# Patient Record
Sex: Male | Born: 1940 | Race: White | Hispanic: No | Smoking: Former smoker
Health system: Southern US, Community
[De-identification: ages and names within clinical notes are randomized; demographics above are authoritative.]

## PROBLEM LIST (undated history)

## (undated) DIAGNOSIS — F32A Depression, unspecified: Secondary | ICD-10-CM

## (undated) DIAGNOSIS — J449 Chronic obstructive pulmonary disease, unspecified: Secondary | ICD-10-CM

## (undated) DIAGNOSIS — J189 Pneumonia, unspecified organism: Secondary | ICD-10-CM

## (undated) DIAGNOSIS — N189 Chronic kidney disease, unspecified: Secondary | ICD-10-CM

## (undated) DIAGNOSIS — Z972 Presence of dental prosthetic device (complete) (partial): Secondary | ICD-10-CM

## (undated) DIAGNOSIS — E669 Obesity, unspecified: Secondary | ICD-10-CM

## (undated) DIAGNOSIS — R58 Hemorrhage, not elsewhere classified: Secondary | ICD-10-CM

## (undated) DIAGNOSIS — F329 Major depressive disorder, single episode, unspecified: Secondary | ICD-10-CM

## (undated) DIAGNOSIS — Z9289 Personal history of other medical treatment: Secondary | ICD-10-CM

## (undated) DIAGNOSIS — G4730 Sleep apnea, unspecified: Secondary | ICD-10-CM

## (undated) DIAGNOSIS — I5189 Other ill-defined heart diseases: Secondary | ICD-10-CM

## (undated) DIAGNOSIS — R0689 Other abnormalities of breathing: Secondary | ICD-10-CM

## (undated) DIAGNOSIS — N183 Chronic kidney disease, stage 3 unspecified: Secondary | ICD-10-CM

## (undated) DIAGNOSIS — R0600 Dyspnea, unspecified: Secondary | ICD-10-CM

## (undated) DIAGNOSIS — I272 Pulmonary hypertension, unspecified: Secondary | ICD-10-CM

## (undated) DIAGNOSIS — I2510 Atherosclerotic heart disease of native coronary artery without angina pectoris: Secondary | ICD-10-CM

## (undated) DIAGNOSIS — I10 Essential (primary) hypertension: Secondary | ICD-10-CM

## (undated) DIAGNOSIS — M1990 Unspecified osteoarthritis, unspecified site: Secondary | ICD-10-CM

## (undated) DIAGNOSIS — Z8631 Personal history of diabetic foot ulcer: Secondary | ICD-10-CM

## (undated) DIAGNOSIS — I2699 Other pulmonary embolism without acute cor pulmonale: Secondary | ICD-10-CM

## (undated) HISTORY — PX: MULTIPLE TOOTH EXTRACTIONS: SHX2053

## (undated) HISTORY — DX: Dyspnea, unspecified: R06.00

## (undated) HISTORY — DX: Major depressive disorder, single episode, unspecified: F32.9

## (undated) HISTORY — DX: Depression, unspecified: F32.A

## (undated) HISTORY — DX: Atherosclerotic heart disease of native coronary artery without angina pectoris: I25.10

## (undated) HISTORY — DX: Sleep apnea, unspecified: G47.30

## (undated) HISTORY — DX: Chronic kidney disease, stage 3 unspecified: N18.30

## (undated) HISTORY — DX: Other ill-defined heart diseases: I51.89

## (undated) HISTORY — DX: Other pulmonary embolism without acute cor pulmonale: I26.99

## (undated) HISTORY — DX: Essential (primary) hypertension: I10

## (undated) HISTORY — DX: Hemorrhage, not elsewhere classified: R58

## (undated) HISTORY — DX: Other abnormalities of breathing: R06.89

## (undated) HISTORY — DX: Chronic obstructive pulmonary disease, unspecified: J44.9

## (undated) HISTORY — DX: Chronic kidney disease, unspecified: N18.9

## (undated) HISTORY — DX: Chronic kidney disease, stage 3 (moderate): N18.3

## (undated) HISTORY — DX: Obesity, unspecified: E66.9

## (undated) HISTORY — DX: Pulmonary hypertension, unspecified: I27.20

---

## 1970-01-31 HISTORY — PX: EXPLORATORY LAPAROTOMY: SUR591

## 1998-01-31 HISTORY — PX: BACK SURGERY: SHX140

## 2005-02-03 ENCOUNTER — Encounter: Admission: RE | Admit: 2005-02-03 | Discharge: 2005-05-04 | Payer: Self-pay | Admitting: *Deleted

## 2007-04-06 ENCOUNTER — Ambulatory Visit: Payer: Self-pay | Admitting: Vascular Surgery

## 2007-05-30 ENCOUNTER — Ambulatory Visit: Payer: Self-pay | Admitting: Pain Medicine

## 2007-06-11 ENCOUNTER — Ambulatory Visit: Payer: Self-pay | Admitting: Pain Medicine

## 2007-06-12 ENCOUNTER — Ambulatory Visit: Payer: Self-pay | Admitting: Pain Medicine

## 2007-07-02 ENCOUNTER — Ambulatory Visit: Payer: Self-pay | Admitting: Pain Medicine

## 2007-08-01 ENCOUNTER — Ambulatory Visit: Payer: Self-pay | Admitting: Pain Medicine

## 2007-08-02 ENCOUNTER — Encounter: Admission: RE | Admit: 2007-08-02 | Discharge: 2007-08-02 | Payer: Self-pay | Admitting: Internal Medicine

## 2007-08-08 ENCOUNTER — Ambulatory Visit: Payer: Self-pay | Admitting: Pain Medicine

## 2007-08-23 ENCOUNTER — Ambulatory Visit: Payer: Self-pay | Admitting: Pain Medicine

## 2007-09-06 ENCOUNTER — Ambulatory Visit: Payer: Self-pay | Admitting: Physician Assistant

## 2007-09-18 ENCOUNTER — Ambulatory Visit: Payer: Self-pay | Admitting: Pain Medicine

## 2007-10-03 ENCOUNTER — Ambulatory Visit: Payer: Self-pay | Admitting: Physician Assistant

## 2007-10-09 ENCOUNTER — Ambulatory Visit: Payer: Self-pay | Admitting: Pain Medicine

## 2007-10-25 ENCOUNTER — Ambulatory Visit: Payer: Self-pay | Admitting: Physician Assistant

## 2007-11-01 ENCOUNTER — Ambulatory Visit: Payer: Self-pay | Admitting: Pain Medicine

## 2007-11-21 ENCOUNTER — Ambulatory Visit: Payer: Self-pay | Admitting: Physician Assistant

## 2007-12-19 ENCOUNTER — Ambulatory Visit: Payer: Self-pay | Admitting: Pain Medicine

## 2008-02-25 ENCOUNTER — Inpatient Hospital Stay: Payer: Self-pay | Admitting: Pain Medicine

## 2008-02-25 ENCOUNTER — Ambulatory Visit: Payer: Self-pay | Admitting: Pain Medicine

## 2008-03-17 ENCOUNTER — Ambulatory Visit: Payer: Self-pay | Admitting: Pain Medicine

## 2008-03-31 DIAGNOSIS — I2699 Other pulmonary embolism without acute cor pulmonale: Secondary | ICD-10-CM

## 2008-03-31 HISTORY — DX: Other pulmonary embolism without acute cor pulmonale: I26.99

## 2008-10-31 DIAGNOSIS — I2510 Atherosclerotic heart disease of native coronary artery without angina pectoris: Secondary | ICD-10-CM

## 2008-10-31 HISTORY — DX: Atherosclerotic heart disease of native coronary artery without angina pectoris: I25.10

## 2008-11-14 HISTORY — PX: CARDIOVASCULAR STRESS TEST: SHX262

## 2008-11-24 ENCOUNTER — Inpatient Hospital Stay (HOSPITAL_COMMUNITY): Admission: AD | Admit: 2008-11-24 | Discharge: 2008-12-04 | Payer: Self-pay | Admitting: Cardiology

## 2008-11-25 ENCOUNTER — Encounter: Payer: Self-pay | Admitting: Cardiology

## 2008-11-25 HISTORY — PX: US ECHOCARDIOGRAPHY: HXRAD669

## 2008-11-26 ENCOUNTER — Ambulatory Visit: Payer: Self-pay | Admitting: Cardiothoracic Surgery

## 2008-11-26 ENCOUNTER — Encounter: Payer: Self-pay | Admitting: Cardiothoracic Surgery

## 2008-11-26 ENCOUNTER — Ambulatory Visit: Payer: Self-pay | Admitting: Internal Medicine

## 2008-12-16 DIAGNOSIS — M1990 Unspecified osteoarthritis, unspecified site: Secondary | ICD-10-CM | POA: Insufficient documentation

## 2008-12-16 DIAGNOSIS — I10 Essential (primary) hypertension: Secondary | ICD-10-CM | POA: Insufficient documentation

## 2008-12-16 DIAGNOSIS — E109 Type 1 diabetes mellitus without complications: Secondary | ICD-10-CM | POA: Insufficient documentation

## 2008-12-16 DIAGNOSIS — F329 Major depressive disorder, single episode, unspecified: Secondary | ICD-10-CM | POA: Insufficient documentation

## 2008-12-17 ENCOUNTER — Ambulatory Visit: Payer: Self-pay | Admitting: Internal Medicine

## 2008-12-17 DIAGNOSIS — R0602 Shortness of breath: Secondary | ICD-10-CM | POA: Insufficient documentation

## 2008-12-17 DIAGNOSIS — I2789 Other specified pulmonary heart diseases: Secondary | ICD-10-CM | POA: Insufficient documentation

## 2008-12-17 DIAGNOSIS — I2510 Atherosclerotic heart disease of native coronary artery without angina pectoris: Secondary | ICD-10-CM | POA: Insufficient documentation

## 2008-12-22 ENCOUNTER — Encounter: Payer: Self-pay | Admitting: Internal Medicine

## 2009-01-01 ENCOUNTER — Ambulatory Visit: Payer: Self-pay | Admitting: Internal Medicine

## 2009-01-05 ENCOUNTER — Telehealth: Payer: Self-pay | Admitting: Internal Medicine

## 2009-01-08 ENCOUNTER — Ambulatory Visit: Payer: Self-pay | Admitting: Pulmonary Disease

## 2009-01-08 DIAGNOSIS — F518 Other sleep disorders not due to a substance or known physiological condition: Secondary | ICD-10-CM | POA: Insufficient documentation

## 2009-01-08 DIAGNOSIS — E669 Obesity, unspecified: Secondary | ICD-10-CM | POA: Insufficient documentation

## 2009-01-08 DIAGNOSIS — G4733 Obstructive sleep apnea (adult) (pediatric): Secondary | ICD-10-CM | POA: Insufficient documentation

## 2009-01-08 DIAGNOSIS — M545 Low back pain, unspecified: Secondary | ICD-10-CM | POA: Insufficient documentation

## 2009-02-20 ENCOUNTER — Encounter: Payer: Self-pay | Admitting: Internal Medicine

## 2009-02-24 ENCOUNTER — Ambulatory Visit: Payer: Self-pay | Admitting: Pulmonary Disease

## 2009-03-02 ENCOUNTER — Inpatient Hospital Stay (HOSPITAL_COMMUNITY): Admission: AD | Admit: 2009-03-02 | Discharge: 2009-03-08 | Payer: Self-pay | Admitting: Cardiology

## 2009-03-03 ENCOUNTER — Ambulatory Visit: Payer: Self-pay | Admitting: Vascular Surgery

## 2009-03-11 ENCOUNTER — Ambulatory Visit: Payer: Self-pay | Admitting: Internal Medicine

## 2009-03-11 DIAGNOSIS — I2699 Other pulmonary embolism without acute cor pulmonale: Secondary | ICD-10-CM | POA: Insufficient documentation

## 2009-03-11 DIAGNOSIS — R3919 Other difficulties with micturition: Secondary | ICD-10-CM | POA: Insufficient documentation

## 2009-03-11 DIAGNOSIS — K661 Hemoperitoneum: Secondary | ICD-10-CM | POA: Insufficient documentation

## 2009-03-11 LAB — CONVERTED CEMR LAB
BUN: 19 mg/dL (ref 6–23)
Basophils Absolute: 0 10*3/uL (ref 0.0–0.1)
Basophils Relative: 0.2 % (ref 0.0–3.0)
CO2: 30 meq/L (ref 19–32)
Calcium: 9 mg/dL (ref 8.4–10.5)
Chloride: 97 meq/L (ref 96–112)
Creatinine, Ser: 1.6 mg/dL — ABNORMAL HIGH (ref 0.4–1.5)
Eosinophils Absolute: 0.3 10*3/uL (ref 0.0–0.7)
Eosinophils Relative: 3.8 % (ref 0.0–5.0)
GFR calc non Af Amer: 46.08 mL/min (ref >60)
Glucose, Bld: 260 mg/dL — ABNORMAL HIGH (ref 70–99)
HCT: 31.7 % — ABNORMAL LOW (ref 39.0–52.0)
Hemoglobin: 10.3 g/dL — ABNORMAL LOW (ref 13.0–17.0)
Lymphocytes Relative: 10.4 % — ABNORMAL LOW (ref 12.0–46.0)
Lymphs Abs: 0.8 10*3/uL (ref 0.7–4.0)
MCHC: 32.5 g/dL (ref 30.0–36.0)
MCV: 97.4 fL (ref 78.0–100.0)
Monocytes Absolute: 0.8 10*3/uL (ref 0.1–1.0)
Monocytes Relative: 9.9 % (ref 3.0–12.0)
Neutro Abs: 6.2 10*3/uL (ref 1.4–7.7)
Neutrophils Relative %: 75.7 % (ref 43.0–77.0)
Platelets: 275 10*3/uL (ref 150.0–400.0)
Potassium: 3.8 meq/L (ref 3.5–5.1)
Pro B Natriuretic peptide (BNP): 139 pg/mL — ABNORMAL HIGH (ref 0.0–100.0)
RBC: 3.25 M/uL — ABNORMAL LOW (ref 4.22–5.81)
RDW: 12.7 % (ref 11.5–14.6)
Sodium: 137 meq/L (ref 135–145)
WBC: 8.1 10*3/uL (ref 4.5–10.5)

## 2009-03-12 ENCOUNTER — Encounter: Admission: RE | Admit: 2009-03-12 | Discharge: 2009-03-12 | Payer: Self-pay | Admitting: Cardiology

## 2009-03-12 ENCOUNTER — Telehealth (INDEPENDENT_AMBULATORY_CARE_PROVIDER_SITE_OTHER): Payer: Self-pay | Admitting: *Deleted

## 2009-03-19 ENCOUNTER — Encounter: Payer: Self-pay | Admitting: Pulmonary Disease

## 2009-03-24 ENCOUNTER — Telehealth: Payer: Self-pay | Admitting: Internal Medicine

## 2009-09-18 ENCOUNTER — Ambulatory Visit: Payer: Self-pay | Admitting: Cardiology

## 2009-12-31 ENCOUNTER — Ambulatory Visit: Payer: Self-pay | Admitting: Cardiology

## 2010-03-02 NOTE — Assessment & Plan Note (Signed)
Summary: 2 week post hosp/apc   Visit Type:  Hospital Follow-up Copy to:  Dr Peter Swaziland Primary Provider/Referring Provider:  Dr. Earlene Plater, [facility] Primary care  CC:  HFU. 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 CABG defrred due to pulm risk d) Possilbe COPD (10 pack year smoker only)- on empriic symbicort e) Obesity f) Out of proportion pulm htn on cath - due to above + ? idiopathic (CTEPH ruled out VQ scan Oct 2010) g) Class 3 dyspnea without desaturation  prior to RCA stent - due to above  Paul Chen is seeing me for the above issues after recent hospitalization for the same. CABG was defered to pulmonary risk. It was thought best to stent RCA, opitimize  OSA and then reassess respiratory status and pulmonary hypertension before deciding on CABG. Today, Paul Chen tells me that dyspnea is significantly better. In hospital, before stent Paul Chen could barely walk 5 feet witihout dyspnea. Today, Paul Chen was able to walk 360 feet in office and stopped due to hip pain and some dyspnea. Still Paul Chen feels dyspneic doing ADLs at home but reckons Paul Chen can climb 1 flight of stairs. Definitely feels stent has helped him. Edema of legs is improved but still present. No associated chest pain, syncope, hemoptysis, orthopnea, pnd. No new complaints. In terms of sleep apnea, Paul Chen is using his CPAP but only 50% of the time.   Current Medications (verified): 1)  Methadose 10 Mg Tabs (Methadone Hcl) .... 2 Tablets Twice A Day 2)  Coumadin 2.5 Mg Tabs (Warfarin Sodium) .... Daily 3)  Glipizide 2.5 Mg Xr24h-Tab (Glipizide) .... Take 1 Tablet By Mouth Two Times A Day 4)  Methocarbamol 500 Mg Tabs (Methocarbamol) .... Take 1 Tablet By Mouth Two Times A Day 5)  Cymbalta 60 Mg Cpep (Duloxetine Hcl) ....  Take 1 Tablet By Mouth Once A Day 6)  Furosemide 40 Mg Tabs (Furosemide) .... Take 1 Tablet By Mouth Two Times A Day 7)  Buspirone Hcl 15 Mg Tabs (Buspirone Hcl) .... Take 1 Tablet By Mouth Two Times A Day 8)  K-Lor 20 Meq Pack (Potassium Chloride) .... Take 1 Tablet By Mouth Two Times A Day 9)  Metoprolol Succinate 100 Mg Xr24h-Tab (Metoprolol Succinate) .... Take 1 Tablet By Mouth Once A Day 10)  Doxazosin Mesylate 4 Mg Tabs (Doxazosin Mesylate) .... Take 1 Tablet By Mouth Once A Day 11)  Allopurinol 100 Mg Tabs (Allopurinol) .... Take 1 Tablet By Mouth Once A Day 12)  Humalog Mix 75/25 75-25 % Susp (Insulin Lispro Prot & Lispro) .... Mix 15 Units Twice A Day and Then 18 Units in The Evening 13)  Aspirin 325 Mg Tabs (Aspirin) .... Take 1 Tablet By Mouth Once A Day 14)  Multivitamins  Tabs (Multiple Vitamin) .... Take 1 Tablet By Mouth Once A Day 15)  Vitamin D 400 Unit Tabs (Cholecalciferol) .... Take 1 Tablet By Mouth Once A Day 16)  Fish Oil 1000 Mg Caps (Omega-3 Fatty Acids) .... Take 1 Tablet By Mouth Two Times A Day 17)  Testosterone Cypionate 200 Mg/ml Oil (Testosterone Cypionate) .... Monthly 18)  Amlodipine Besylate 10 Mg Tabs (Amlodipine Besylate) .... Take 1 Tablet By Mouth Once A Day  19)  Symbicort 80-4.5 Mcg/act Aero (Budesonide-Formoterol Fumarate) .... 2 Puffs Twice Daily 20)  Plavix 75 Mg Tabs (Clopidogrel Bisulfate) .... Take 1 Tablet By Mouth Once A Day 21)  Crestor 20 Mg Tabs (Rosuvastatin Calcium) .... Take 1 Tablet By Mouth Once A Day 22)  Ativan 0.5 Mg Tabs (Lorazepam) .... As Needed 23)  Vitamin E 400 Unit Caps (Vitamin E) .... Take 1 Tablet By Mouth Once A Day 24)  Nitrostat 0.4 Mg Subl (Nitroglycerin) .... As Needed Chest Pain  Allergies (verified): No Known Drug Allergies  Past History:  Family History: Last updated: December 22, 2008 Father-dies age 37 of MI Mother-died of unknown causes 1 brother-died of heart disease  Social History: Last updated:  December 22, 2008 Divorced Has 3 children  Quit smoking in 1982. smoked x 10 years Occ. 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 CABG defrred due to pulm risk d) Possilbe COPD - on empriic symbicort e) Obesity f) Out of proportion pulm htn on cath - due to above + ? idiopathic (CTEPH ruled out VQ scan Oct 2010) g) Class 3 dyspnea without desaturation  prior to RCA stent - due to above  Past Surgical History: Back surgery in 2000 exploratory Laparotomy in 1972  Stent placed  Family History: Reviewed history from December 22, 2008 and no changes required. Father-dies age 83 of MI Mother-died of unknown causes 1 brother-died of heart disease  Social History: Reviewed history from Dec 22, 2008 and no changes required. Divorced Has 3 children  Quit smoking in 1982. smoked x 10 years Occ. Wine Disabled  Review of Systems       The patient complains of shortness of breath with activity and hand/feet swelling.  The patient denies shortness of breath at rest, productive cough, non-productive cough, coughing up blood, chest pain, irregular heartbeats, acid heartburn, indigestion, loss of appetite, weight change, abdominal pain, difficulty swallowing, sore throat, tooth/dental problems, headaches, nasal congestion/difficulty breathing through nose, sneezing, itching, ear ache, anxiety, depression, joint stiffness or pain, rash, change in color of mucus, and fever.    Vital Signs:  Patient profile:   70 year old male Height:      70 inches Weight:      265.25 pounds BMI:     38.20 O2 Sat:      97 % on Room air Pulse rate:   69 / minute BP sitting:   140 / 70   (right arm) Cuff size:   regular  Vitals Entered By: Carron Curie CMA (December 17, 2008 3:54 PM)  O2 Flow:  Room air  Serial Vital Signs/Assessments:  Comments: Ambulatory Pulse Oximetry  Resting; HR__75___    02 Sat_92____room air  Lap1 (185 feet)   HR_94____   02 Sat__90___ Lap2 (185 feet)   HR__94___   02 Sat__92___    Lap3 (185 feet)   HR_____   02 Sat_____  ___Test Completed without Difficulty __x_Test Stopped due to: pt experiencing hip pain. Pt walks with cane   By: Carron Curie CMA   CC: HFU.  Comments Medications reviewed with patient Carron Curie CMA  December 17, 2008 3:56 PM    Physical Exam  General:  well developed, well nourished, in no acute distressobese.   Head:  normocephalic and atraumatic Eyes:  PERRLA/EOM intact; conjunctiva and sclera clear Ears:  TMs intact and clear with normal canals Nose:  no deformity, discharge, inflammation, or lesions Mouth:  no deformity or lesions Neck:  no masses, thyromegaly, or abnormal cervical nodes Chest Wall:  no deformities noted Lungs:  clear bilaterally to auscultation and percussion Heart:  regular rate and rhythm, S1, S2 without murmurs, rubs, gallops, or clicks Abdomen:  bowel sounds positive; abdomen soft and non-tender without masses, or organomegaly Msk:  no deformity or scoliosis noted with normal posture Pulses:  pulses normal Extremities:  no clubbing, cyanosis, edema, or deformity noted Neurologic:  CN II-XII grossly intact with normal reflexes, coordination, muscle strength and tone Skin:  intact without lesions or rashes Cervical Nodes:  no significant adenopathy Axillary Nodes:  no significant adenopathy Psych:  alert and cooperative; normal mood and affect; normal attention span and concentration Additional Exam:  rt suprascapular area - bandage due to itching and some skin tear bleeding.    Impression & Recommendations:  Problem # 1:  SHORTNESS OF BREATH  (ICD-786.05) Assessment New Improved after placement of RCA stent from Class 3 to Class 2 dyspnea. Will need to assess from Dr. Swaziland if cardiac rehab will be of additonal benefit. I have left message in his office for him to call me back. Also, I am not sure if Paul Chen really has COPD. I had started symbicort on empiric basis. Will recheck full PFTs and decide on stopping vs resttartin symbciort  Orders: Pulmonary Referral (Pulmonary) Est. Patient Level III (62130)  Problem # 2:  SLEEP APNEA (ICD-780.57) Assessment: Unchanged Now semi compliant with cPAP but still having tolerance issues. Paul Chen has gained 30# weight since original dx of OSA in 2007. THerefore, it is possible that current settings are inadequate as well  plan refer to Drs Vassie Loll or Craige Cotta for sleep apnea eval and optimization Orders: Sleep Disorder Referral (Sleep Disorder) Est. 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 Now, once Paul Chen completes OSA re-eval and demonstrates compliance with cPAP we can re-echo him and check bnp VS redo Rt Heart cath to assess his pulm htn status. AT that time, if it looks like Paul Chen has IPAH then we can consider advanced PAH Rx  Orders: Est. Patient Level III (46962)  Problem # 4:  CAD (ICD-414.00) Assessment: Improved  Neeed to ascertain from Dr. Swaziland what his plans for CABG are   His updated medication list for this problem includes:    Furosemide 40 Mg Tabs (Furosemide) .Marland Kitchen... Take 1 tablet by mouth two times a day    Metoprolol Succinate 100 Mg Xr24h-tab (Metoprolol succinate) .Marland Kitchen... Take 1 tablet by mouth once a day    Doxazosin Mesylate 4 Mg Tabs (Doxazosin mesylate) .Marland Kitchen... Take 1 tablet by mouth once a day    Aspirin 325 Mg Tabs (Aspirin) .Marland Kitchen... Take 1 tablet by mouth once a day    Amlodipine Besylate 10 Mg Tabs (Amlodipine besylate) .Marland Kitchen... Take 1 tablet by mouth once a day    Plavix 75 Mg Tabs  (Clopidogrel bisulfate) .Marland Kitchen... Take 1 tablet by mouth once a day    Nitrostat 0.4 Mg Subl (Nitroglycerin) .Marland Kitchen... As needed chest pain  Orders: Est. Patient Level III (95284)  Medications Added to Medication List This Visit: 1)  Glipizide 2.5 Mg Xr24h-tab (Glipizide) .... Take 1 tablet by  mouth two times a day 2)  Metoprolol Succinate 100 Mg Xr24h-tab (Metoprolol succinate) .... Take 1 tablet by mouth once a day 3)  Aspirin 325 Mg Tabs (Aspirin) .... Take 1 tablet by mouth once a day 4)  Amlodipine Besylate 10 Mg Tabs (Amlodipine besylate) .... Take 1 tablet by mouth once a day 5)  Symbicort 80-4.5 Mcg/act Aero (Budesonide-formoterol fumarate) .... 2 puffs twice daily 6)  Plavix 75 Mg Tabs (Clopidogrel bisulfate) .... Take 1 tablet by mouth once a day 7)  Crestor 20 Mg Tabs (Rosuvastatin calcium) .... Take 1 tablet by mouth once a day 8)  Ativan 0.5 Mg Tabs (Lorazepam) .... As needed 9)  Vitamin E 400 Unit Caps (Vitamin e) .... Take 1 tablet by mouth once a day 10)  Nitrostat 0.4 Mg Subl (Nitroglycerin) .... As needed chest pain  Patient Instructions: 1)  Have full pft 2)  Call us after you have pft breathing test - so I look at it and call you back 3)  Please visit with Drs. Sood or Cylinder for Sleep Apnea evaluation 4)  I will talk to Dr. Swaziland about a) if you need bypass; b) reassesment of your pulmonary hypertension; and c) if you will benefit from cardiac rehab 5)  Please return in 3 months regardless or sooner if there are problems

## 2010-03-02 NOTE — Letter (Signed)
Summary: [facility]  [facility]   Imported By: Sherian Rein 12/31/2008 11:47:37  _____________________________________________________________________  External Attachment:    Type:   Image     Comment:   External Document

## 2010-03-02 NOTE — Miscellaneous (Signed)
Summary: Orders Update pft charges  Clinical Lists Changes  Orders: Added new Service order of Carbon Monoxide diffusing w/capacity (94720) - Signed Added new Service order of Lung Volumes (94240) - Signed Added new Service order of Spirometry (Pre & Post) (94060) - Signed 

## 2010-03-02 NOTE — Progress Notes (Signed)
Summary: radiology appt  Phone Note From Other Clinic   Caller: tina w/ [facility] Call For: ramaswamy Summary of Call: how soon does mr want pt to have ivc filter replacement? caller says friday next week is "wide open". [phone] Initial call taken by: Tivis Ringer, CNA,  March 12, 2009 3:02 PM  Follow-up for Phone Call        spoke to tina she is to call pt and set it up at pt's convience Follow-up by: Oneita Jolly,  March 13, 2009 10:20 AM     Appended Document: radiology appt Candise Bowens  I just spoke to Dr. Swaziland just now. HE is restarting coumadin. So, please cancel IVC filter order and inform patient. Patient will be diuresed by Dr. Swaziland  Thanks  Murali  Appended Document: radiology appt pt advised and appt cancelled for IVC filter.

## 2010-03-02 NOTE — Assessment & Plan Note (Signed)
Summary: sleep consult/ mbw   Copy to:  Dr Peter Swaziland Primary Provider/Referring Provider:  Dr. Earlene Plater, [facility] Primary care  CC:  Sleep consult. Pt currently wears CPAP but is unsure of his homehealth company of CPAP settings. Epworth score is 18.Marland Kitchen  History of Present Illness: 70 yo male for evaluation of sleep apnea.  He was diagnosed with sleep apnea about 3 years after having a sleep test in [place], [place].  He has been using CPAP for the past one year.  He has nasal pillows with a humidifer.  He uses [facility] for his DME.  He had a full face mask, but had trouble with mask leak.    He goes to bed at 11pm and falls asleep quickly.  He will sleep through the night unless his dog wakes him up.  He wakes up at 5 am.  He will then sit in his chair and watch TV, but will usually fall asleep for a while doing this.  He is not sure if he still snores while using CPAP.  He does talk in his sleep.  He also kicks his legs some while asleep.  He uses his CPAP about 5 nights per week, for about 5 or 6 hours per night.  He sometimes has trouble with nasal congestion, and has trouble using his CPAP on these nights.  He takes a nap for about an hour 5 or 6 days per week.  He is not using his CPAP when he naps.  He will also easily fall asleep when watching TV or reading.  He does not have problems with driving.  He had back surgery in 2000, and has chronic back and leg pain since.  This causes trouble for him to fall asleep about 1/2 the nights.  He denies nightmares, sleep walking, or bruxism.  There is no history of sleep hallucinations, sleep paralysis, or cataplexy.  He denies restless legs.  He does not use anything to help him fall asleep or stay awake.  He has gained about 40 lbs over the past 6 months.  Current Medications (verified): 1)  Methadose 10 Mg Tabs (Methadone Hcl) .... 2 Tablets Twice A Day 2)  Coumadin 2.5 Mg Tabs (Warfarin Sodium) .... Daily 3)  Glipizide 2.5 Mg  Xr24h-Tab (Glipizide) .... Take 1 Tablet By Mouth Two Times A Day 4)  Methocarbamol 500 Mg Tabs (Methocarbamol) .... Take 1 Tablet By Mouth Two Times A Day 5)  Cymbalta 60 Mg Cpep (Duloxetine Hcl) .... Take 1 Tablet By Mouth Once A Day 6)  Furosemide 40 Mg Tabs (Furosemide) .... Take 1 Tablet By Mouth Two Times A Day 7)  Buspirone Hcl 15 Mg Tabs (Buspirone Hcl) .... Take 1 Tablet By Mouth Two Times A Day 8)  K-Lor 20 Meq Pack (Potassium Chloride) .... Take 1 Tablet By Mouth Two Times A Day 9)  Metoprolol Succinate 100 Mg Xr24h-Tab (Metoprolol Succinate) .... Take 1 Tablet By Mouth Once A Day 10)  Doxazosin Mesylate 4 Mg Tabs (Doxazosin Mesylate) .... Take 1 Tablet By Mouth Once A Day 11)  Allopurinol 100 Mg Tabs (Allopurinol) .... Take 1 Tablet By Mouth Once A Day 12)  Humalog Mix 75/25 75-25 % Susp (Insulin Lispro Prot & Lispro) .... Mix 15 Units Twice A Day and Then 18 Units in The Evening 13)  Aspirin 325 Mg Tabs (Aspirin) .... Take 1 Tablet By Mouth Once A Day 14)  Multivitamins  Tabs (Multiple Vitamin) .... Take 1 Tablet By Mouth Once  A Day 15)  Vitamin D 400 Unit Tabs (Cholecalciferol) .... Take 1 Tablet By Mouth Once A Day 16)  Fish Oil 1000 Mg Caps (Omega-3 Fatty Acids) .... Take 1 Tablet By Mouth Two Times A Day 17)  Testosterone Cypionate 200 Mg/ml Oil (Testosterone Cypionate) .... Monthly 18)  Amlodipine Besylate 10 Mg Tabs (Amlodipine Besylate) .... Take 1 Tablet By Mouth Once A Day 19)  Symbicort 80-4.5 Mcg/act Aero (Budesonide-Formoterol Fumarate) .... 2 Puffs Twice Daily 20)  Plavix 75 Mg Tabs (Clopidogrel Bisulfate) .... Take 1 Tablet By Mouth Once A Day 21)  Crestor 20 Mg Tabs (Rosuvastatin Calcium) .... Take 1 Tablet By Mouth Once A Day 22)  Ativan 0.5 Mg Tabs (Lorazepam) .... As Needed 23)  Vitamin E 400 Unit Caps (Vitamin E) .... Take 1 Tablet By Mouth Once A Day 24)  Nitrostat 0.4 Mg Subl (Nitroglycerin) .... 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 CABG defrred due to pulm risk d) Possilbe COPD - on empriic symbicort e) Obesity f) Out of proportion pulm htn on cath - due to above + ? idiopathic (CTEPH ruled out VQ scan Oct 2010) g) Class 3 dyspnea without desaturation  prior to RCA stent - due to above  Past Surgical History: Last updated: 12/17/2008 Back surgery in 2000 exploratory Laparotomy in 1972  Stent placed  Family History: Reviewed history from 12/16/2008 and no changes required. Father-dies age 47 of MI Mother-died of unknown causes 1 brother-died of heart disease  Social History: Reviewed history from 12/16/2008 and no changes required. Divorced Has 3 children  Quit smoking in 1982. smoked x 10 years Occ. Wine Disabled  Vital Signs:  Patient profile:   69 year old male Height:      70 inches (177.80 cm) Weight:      276.25 pounds (125.57 kg) BMI:     39.78 O2 Sat:      96 % on Room air Temp:     97.7 degrees F (36.50 degrees C) oral Pulse rate:   59 / minute BP sitting:   132 / 76  (left arm) Cuff size:   large  Vitals Entered By: Michel Bickers CMA (January 08, 2009 3:35 PM)  O2 Flow:  Room air  Physical Exam  General:  obese.   Eyes:  PERRLA and EOMI.   Nose:  no deformity, discharge, inflammation, or lesions Mouth:  MP 3 airway, no oral lesions Neck:  no masses, thyromegaly, or abnormal cervical nodes Chest Wall:  no deformities noted Lungs:  clear bilaterally to auscultation and percussion Heart:  regular rate and rhythm, S1, S2 without murmurs, rubs, gallops, or  clicks Abdomen:  bowel sounds positive; abdomen soft and non-tender without masses, or organomegaly Msk:  no deformity or scoliosis noted with normal posture Pulses:  pulses normal Extremities:  no clubbing, cyanosis, edema, or deformity noted Neurologic:  CN II-XII grossly intact with normal reflexes, coordination, muscle strength and tone Cervical Nodes:  no significant adenopathy Psych:  alert and cooperative; normal mood and affect; normal attention span  and concentration   Impression & Recommendations:  Problem # 1:  OBSTRUCTIVE SLEEP APNEA (ICD-327.23) Assessment Unchanged He has a prior diagnosis of sleep apnea.  I explained to him how sleep apnea can affect his health.  The importance of weight loss, and driving precautions were reviewed.  Various treatment options for his sleep apnea were discussed.    He has continued sleep disruption and daytime hypersomnolence.  He has gained weight.  I am concerned that he may need adjustment in his pressure setting for his CPAP.  I will arrange for him to have an auto-CPAP titration study.  If this is unsuccessful, he will need to have an in-lab titration study.  Problem # 2:  OBESITY (ICD-278.00) As above.  Problem # 3:  LOW BACK PAIN, CHRONIC (ICD-724.2) This is likely contributing to some of his sleep difficulties.  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  Proper sleep hygiene techniques were reviewed.  I also explained the importance of using his CPAP whenever he is asleep, including during naps.  Complete Medication List: 1)  Methadose 10 Mg Tabs (Methadone hcl) .... 2 tablets twice a day 2)  Coumadin 2.5 Mg Tabs (Warfarin sodium) .... Daily 3)  Glipizide 2.5 Mg Xr24h-tab (Glipizide) .... Take 1 tablet by mouth two times a day 4)  Methocarbamol 500 Mg Tabs (Methocarbamol) .... Take 1 tablet by mouth two times a day 5)  Cymbalta 60 Mg Cpep (Duloxetine hcl) .... Take 1  tablet by mouth once a day 6)  Furosemide 40 Mg Tabs (Furosemide) .... Take 1 tablet by mouth two times a day 7)  Buspirone Hcl 15 Mg Tabs (Buspirone hcl) .... Take 1 tablet by mouth two times a day 8)  K-lor 20 Meq Pack (Potassium chloride) .... Take 1 tablet by mouth two times a day 9)  Metoprolol Succinate 100 Mg Xr24h-tab (Metoprolol succinate) .... Take 1 tablet by mouth once a day 10)  Doxazosin Mesylate 4 Mg Tabs (Doxazosin mesylate) .... Take 1 tablet by mouth once a day 11)  Allopurinol 100 Mg Tabs (Allopurinol) .... Take 1 tablet by mouth once a day 12)  Humalog Mix 75/25 75-25 % Susp (Insulin lispro prot & lispro) .... Mix 15 units twice a day and then 18 units in the evening 13)  Aspirin 325 Mg Tabs (Aspirin) .... Take 1 tablet by mouth once a day 14)  Multivitamins Tabs (Multiple vitamin) .... Take 1 tablet by mouth once a day 15)  Vitamin D 400 Unit Tabs (Cholecalciferol) .... Take 1 tablet by mouth once a day 16)  Fish Oil 1000 Mg Caps (Omega-3 fatty acids) .... Take 1 tablet by mouth two times a day 17)  Testosterone Cypionate 200 Mg/ml Oil (Testosterone cypionate) .... Monthly 18)  Amlodipine Besylate 10 Mg Tabs (Amlodipine besylate) .... Take 1 tablet by mouth once a day 19)  Symbicort 80-4.5 Mcg/act Aero (Budesonide-formoterol fumarate) .... 2 puffs twice daily 20)  Plavix 75 Mg Tabs (Clopidogrel bisulfate) .... Take 1 tablet by mouth once a day 21)  Crestor 20 Mg Tabs (Rosuvastatin calcium) .... Take 1 tablet by mouth once a day 22)  Ativan 0.5 Mg Tabs (Lorazepam) .... As needed 23)  Vitamin E 400 Unit Caps (Vitamin e) .... Take 1 tablet by mouth once a day 24)  Nitrostat 0.4 Mg Subl (Nitroglycerin) .... As needed chest pain  Other Orders: Est. Patient Level IV (82993) DME Referral (DME)  Patient Instructions: 1)  Will arrange for sleep test at home 2)  Follow up in 6 to 8 weeks

## 2010-03-02 NOTE — Letter (Signed)
Summary: [facility]  [facility]   Imported By: Sherian Rein 05/07/2009 10:48:46  _____________________________________________________________________  External Attachment:    Type:   Image     Comment:   External Document

## 2010-03-02 NOTE — Progress Notes (Signed)
Summary: pft rreview  Phone Note Outgoing Call   Summary of Call: jen, I revieweed his PFT. Shows some obstruction pattern that is mild. FOr now, best to continue symbicport till I see him again Initial call taken by: Kalman Shan MD,  January 05, 2009 5:15 PM  Follow-up for Phone Call        pt advised and scheduled to see MR on 03/03/08 at 1:30. pt aware of appt.Carron Curie CMA  January 07, 2009 10:47 AM

## 2010-03-02 NOTE — Assessment & Plan Note (Signed)
Summary: hfu/ mbw   Visit Type:  Follow-up Primary Provider/Referring Provider:  Dr. Earlene Plater, [facility] Primary care  CC:  HFU. Pt states breathing is worse since discharge.  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 CTEPH ruled out Oct-2010 b) OSA dex 2007  - non compliant with CPAP. Now following Dr. 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 CABG defrred due to pulm risk d) Possilbe COPD (10 pack year smoker only)- Mixed obstruction -restriction on PFTs Nov 2010 with normal DLCO. Oon empriic symbicort e) Obesity f) Out of proportion pulm htn on cath - due to above + ? idiopathic (CTEPH ruled out VQ scan Oct 2010) g) Class 3 dyspnea without desaturation  prior to RCA stent - due to all of above   Paul Chen is seeing me for the above issues. He is now seeing Dr. Craige Cotta for OSA and CPAP compliance better. In Nov 2010 dyspnea improved significantly after RCA stent but he had worsening dyspnea on exertion after that. Admitted 03/02/2009 through 03/08/2009. Underwent Rt and Left heart cath. Showed patent RCA but PA presure elevated Mean 30s with PCWP 22 (see Dr. Swaziland dictation). Therefore, trial of diuresis started.  However, cath complicated byRP hematoma 15cm in pelvis with leftward dislocation of bladder (CT 03/03/2009). Today, he states that since dishcarge a week ago main issue is tense abdomen, inability to void completely, worsening hip pain and worsening generalized edema. All following RP hematoma following cath. AT time he states dyspnea worse but at time he states that he is more limited by hip pain. Daughter also notices associated wheezing when dyspneic with exertion but unable to certify if this is worse than baseline or not. Definitely no fevver, cough, sputum. No other aggravating or relieving facors. No radiation. Unclear if course progressive.   Current  Medications (verified): 1)  Methadose 10 Mg Tabs (Methadone Hcl) .... 2 Tablets Twice A Day 2)  Glipizide 2.5 Mg Xr24h-Tab (Glipizide) .... Take 1 Tablet By Mouth Two Times A Day 3)  Methocarbamol 500 Mg Tabs (Methocarbamol) .... Take 1 Tablet By Mouth Two Times A Day 4)  Cymbalta 60 Mg Cpep (Duloxetine Hcl) .... Take 1 Tablet By Mouth Once A Day 5)  Furosemide 40 Mg Tabs (Furosemide) .... 2 Tablets in The Am, One Tab in Pm 6)  Buspirone Hcl 15 Mg Tabs (Buspirone Hcl) .... Take 1 Tablet By Mouth Two Times A Day 7)  K-Lor 20 Meq Pack (Potassium Chloride) .... Take 1 Tablet By Mouth Two Times A Day 8)  Metoprolol Succinate 100 Mg Xr24h-Tab (Metoprolol Succinate) .... Take 1 Tablet By Mouth Once A Day 9)  Doxazosin Mesylate 4 Mg Tabs (Doxazosin Mesylate) .... Take 1 Tablet By Mouth Once A Day 10)  Allopurinol 100 Mg Tabs (Allopurinol) .... Take 1 Tablet By Mouth Once A Day 11)  Humalog Mix 75/25 75-25 % Susp (Insulin Lispro Prot & Lispro) .... Mix 18 Units Twice A Day 12)  Multivitamins  Tabs (Multiple Vitamin) .... Take 1 Tablet By Mouth Once A Day 13)  Vitamin D 400 Unit Tabs (Cholecalciferol) .... Take 1 Tablet By Mouth Once A Day 14)  Fish Oil 1000 Mg Caps (Omega-3 Fatty Acids) .... Take 1 Tablet By Mouth Two Times A Day 15)  Testosterone Cypionate 200 Mg/ml Oil (Testosterone Cypionate) .... Monthly 16)  Amlodipine Besylate 10 Mg Tabs (Amlodipine Besylate) .... Take 1 Tablet By Mouth Once A Day 17)  Symbicort 80-4.5 Mcg/act Aero (Budesonide-Formoterol Fumarate) .... 2 Puffs Twice Daily 18)  Plavix 75 Mg Tabs (Clopidogrel Bisulfate) .... Take 1 Tablet By Mouth Once A Day 19)  Crestor 20 Mg Tabs (Rosuvastatin Calcium) .... Take 1 Tablet By Mouth Once A Day 20)  Ativan 0.5 Mg Tabs (Lorazepam) .... As Needed 21)  Nitrostat 0.4 Mg Subl (Nitroglycerin) .... As Needed Chest Pain  Allergies (verified): No Known Drug Allergies  Past History:  Family History: Last updated: 01-08-2009 Father-dies  age 18 of MI Mother-died of unknown causes 1 brother-died of heart disease  Social History: Last updated: Jan 08, 2009 Divorced Has 3 children  Quit smoking in 1982. smoked x 10 years Occ. Wine Disabled  Past Medical History: Reviewed history from 12/17/2008 and no changes required. 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  - life long coumadin, no IVC filter. 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 CABG defrred due to pulm risk d) Possilbe COPD - on empriic symbicort e) Obesity f) Out of proportion pulm htn on cath - due to above + ? idiopathic (CTEPH ruled out VQ scan Oct 2010) g) Class 3 dyspnea without desaturation  prior to RCA stent - due to above  Past Surgical History: Reviewed history from 12/17/2008 and no changes required. Back surgery in 2000 exploratory Laparotomy in 1972  Stent placed  Family History: Reviewed history from Jan 08, 2009 and no changes required. Father-dies age 13 of MI Mother-died of unknown causes 1 brother-died of heart disease  Social History: Reviewed history from 01-08-09 and no changes required. Divorced Has 3 children  Quit smoking in 1982. smoked x 10 years Occ. Wine Disabled  Review of Systems       The patient complains of shortness of breath with activity and shortness of breath at rest.  The patient denies productive cough, non-productive cough, coughing up blood, chest pain, irregular heartbeats, acid heartburn, indigestion, loss of appetite, weight change, abdominal pain, difficulty swallowing, sore throat, tooth/dental problems, headaches, nasal congestion/difficulty breathing through nose, sneezing, itching, ear ache, anxiety, depression, hand/feet swelling, joint stiffness or pain, rash, change in color  of mucus, and fever.    Vital Signs:  Patient profile:   70 year old male Height:      70 inches Weight:      278.25 pounds O2 Sat:      95 % on Room air Pulse rate:   90 / minute BP sitting:   120 / 70  (right arm) Cuff size:   regular  Vitals Entered By: Paul Chen CMA (March 11, 2009 11:43 AM)  O2 Flow:  Room air  Serial Vital Signs/Assessments:  Comments: Ambulatory Pulse Oximetry  Resting; HR__84___    02 Sat_95____  Lap1 (185 feet)   HR__96___   02 Sat__94___ Lap2 (185 feet)   HR_____   02 Sat_____    Lap3 (185 feet)   HR_____   02 Sat_____  ___Test Completed without Difficulty __x_Test Stopped due to: pt hip pain, pt walks with a cane   By: Paul Chen CMA   CC: HFU. Pt states breathing is worse since discharge Comments Medications reviewed with patient Paul Chen CMA  March 11, 2009 11:46 AM Daytime phone number verified with patient.    Physical Exam  General:  obese.  looks more depressed than before Head:  normocephalic and atraumatic Eyes:  PERRLA and EOMI.   Ears:  TMs intact and clear with normal canals Nose:  no deformity, discharge, inflammation, or lesions Mouth:  MP 3 airway, no oral lesions Neck:  no masses, thyromegaly, or abnormal cervical nodes Chest Wall:  no deformities noted Lungs:  decreased BS bilateral and prolonged exhilation.   no distress no acc muscle use no crackles no wheeze Heart:  regular rate and rhythm, S1, S2 without murmurs, rubs, gallops, or clicks Abdomen:  belly distended tense non tender normal bowel sounds no mass  very different kind of exam than before Msk:  no deformity or scoliosis noted with normal posture Pulses:  pulses normal Extremities:  no clubbing, cyanosis, edema, or deformity noted Neurologic:  CN II-XII grossly intact with normal reflexes, coordination, muscle strength and tone Skin:  intact without lesions or rashes Cervical Nodes:  no significant  adenopathy Axillary Nodes:  no significant adenopathy Psych:  depressed affect, anxious, and poor historian.     CT of Abdomen  Procedure date:  03/03/2009  Findings:      IMPRESSION:    1.  Moderately large right pelvic retroperitoneal hematoma and   smaller amount of hemorrhage in the space of Retzius.   2.  Bilateral inguinal adenopathy.   3.  Diffuse fatty infiltration of the liver.    Read By:  Darrol Angel,  M.D.   Released By:  Darrol Angel,  M.D.   Comments:      I also think that the hemotoma is causing pressure effect on bladder and displacing it to left  Impression & Recommendations:  Problem # 1:  HEMOPERITONEUM (ICD-568.81) Assessment New He has large Periotneal hematoma from cath. This is seen on CT 03/03/2009. Supsect it will tkae months to resolve. Becuase of this he is off coumadin.  I have asked him and dughter to get prognosis on tis from Dr. Swaziland. Wil hceck CBC today to ensure stability   Orders: TLB-CBC Platelet - w/Differential (85025-CBCD) TLB-BMP (Basic Metabolic Panel-BMET) (80048-METABOL) TLB-BNP (B-Natriuretic Peptide) (83880-BNPR) Est. Patient Level V (40981) Radiology Referral (Radiology) Urology Referral (Urology) Est. Patient Level V (19147)  Problem # 2:  OTHER ABNORMALITY OF URINATION (WGN-562.13) Assessment: New Having incomplete urination and frequency since RP hematoma onset. CT 2/1 shows pressure effect on blader. Concerned that he is at risk for hydronephroisis and UTI. Therefore, will check Creatinine today. Asked him to Talk to DR. Swaziland abut urology referral. Icalled Dr. Elvis Coil office and left message for him to call me back. Maybe I will go ahead and make urology referral  Orders: TLB-CBC Platelet - w/Differential (85025-CBCD) TLB-BMP (Basic Metabolic Panel-BMET) (80048-METABOL) TLB-BNP (B-Natriuretic Peptide) (83880-BNPR) Est. Patient Level V (08657) Urology Referral (Urology) Est. Patient Level V (84696)  Problem #  3:  SHORTNESS OF BREATH (ICD-786.05) Assessment: Deteriorated  I am unclear if he is having worsening dyspnea since discharge or not. AT time he says yes but at times he is denying. No evidence for AECOPD or AECHF or Pnuemonoia currently or another episode pf PE.. But, wil hceck BNP. Informed daughter and him to watch for AE-COPD symptoms and if worse to call for abx or go to ER. They verbalized understanding  Orders: Est. 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 For now, controlling his left heart and OSA is best Rx strategy. For OSA per Dr Craige Cotta  Orders: Est. Patient Level V [phone])  Problem # 5:  PULMONARY EMBOLISM (ICD-415.19) Assessment: Unchanged  He has had 2 PEs so far and therefore is comitted to life long coumadin. However, coumadin stopped in Jan 2011 after RP hemorrhage following cath. No IVC filter placed during admission. I discharged him from office before I could rrealize he has no IVC filter. I will call him and let him and duaghter know that he needs IVC filter. Will discuss this with Dr. Swaziland as well. Await Dr. Elvis Coil call back  The following medications were removed from the medication list:    Coumadin 2.5 Mg Tabs (Warfarin sodium) .Marland Kitchen... Daily    Aspirin 325 Mg Tabs (Aspirin) .Marland Kitchen... Take 1 tablet by mouth once a day His updated medication list for this problem includes:    Plavix 75 Mg Tabs (Clopidogrel bisulfate) .Marland Kitchen... Take 1 tablet by mouth once a day  Orders: Est. Patient Level V (98119) Radiology Referral (Radiology) Urology Referral (Urology) Est. Patient Level V [phone])  Medications Added to Medication List This Visit: 1)  Furosemide 40 Mg Tabs (Furosemide) .... 2 tablets in the am, one tab in pm 2)  Humalog Mix 75/25 75-25 % Susp (Insulin lispro prot & lispro) .... Mix 18 units twice a day  Patient Instructions: 1)  If  you get more cough, more shortness of breath than now, more wheeze than now, new fever call us or go to ER. This is the point, where  you might need antibiotics or a doctor evaluation 2)  GEt CBC, BNP and Chemsitry blood work now. Will call you with results 3)  Followup based on results. Just make an appt for 1 month from now to be on safe side 4)  .Marland Kitchen... 5)  update  6)  needs ivc filter (updated daughter and patient 18:11 over phone) 7)  will refer to urology    Immunization History:  Influenza Immunization History:    Influenza:  fluvax 3+ (02/02/2009)  Pneumovax Immunization History:    Pneumovax:  pneumovax (10/31/2008)   Appended Document: hfu/ mbw prder has been placed for urology and IVC filter to [facility].

## 2010-03-02 NOTE — Letter (Signed)
Summary: [facility]   Imported By: Sherian Rein 03/13/2009 08:32:26  _____________________________________________________________________  External Attachment:    Type:   Image     Comment:   External Document

## 2010-03-02 NOTE — Progress Notes (Signed)
Summary: 6 month f/u  ---- Converted from flag ---- ---- 03/23/2009 4:04 PM, Kalman Shan MD wrote: he is to see me for fu in 6 months ------------------------------  reminder placed for appt in 6 months, appt cancelled for march. pt aware to call us inthe meantime until if any problems arise.

## 2010-04-18 LAB — COMPREHENSIVE METABOLIC PANEL
ALT: 36 U/L (ref 0–53)
AST: 27 U/L (ref 0–37)
Albumin: 3.6 g/dL (ref 3.5–5.2)
Alkaline Phosphatase: 62 U/L (ref 39–117)
BUN: 18 mg/dL (ref 6–23)
CO2: 30 mEq/L (ref 19–32)
Calcium: 8.6 mg/dL (ref 8.4–10.5)
Chloride: 101 mEq/L (ref 96–112)
Creatinine, Ser: 1.53 mg/dL — ABNORMAL HIGH (ref 0.4–1.5)
GFR calc Af Amer: 55 mL/min — ABNORMAL LOW (ref >60)
GFR calc non Af Amer: 46 mL/min — ABNORMAL LOW (ref >60)
Glucose, Bld: 201 mg/dL — ABNORMAL HIGH (ref 70–99)
Potassium: 3.6 mEq/L (ref 3.5–5.1)
Sodium: 138 mEq/L (ref 135–145)
Total Bilirubin: 0.5 mg/dL (ref 0.3–1.2)
Total Protein: 6.5 g/dL (ref 6.0–8.3)

## 2010-04-18 LAB — APTT: aPTT: 34 seconds (ref 24–37)

## 2010-04-18 LAB — PROTIME-INR
INR: 1.08 (ref 0.00–1.49)
Prothrombin Time: 13.9 seconds (ref 11.6–15.2)

## 2010-04-18 LAB — CBC
HCT: 38.7 % — ABNORMAL LOW (ref 39.0–52.0)
Hemoglobin: 13.1 g/dL (ref 13.0–17.0)
MCHC: 34 g/dL (ref 30.0–36.0)
MCV: 97.8 fL (ref 78.0–100.0)
Platelets: 138 10*3/uL — ABNORMAL LOW (ref 150–400)
RBC: 3.96 MIL/uL — ABNORMAL LOW (ref 4.22–5.81)
RDW: 13.7 % (ref 11.5–15.5)
WBC: 6.2 10*3/uL (ref 4.0–10.5)

## 2010-04-18 LAB — BRAIN NATRIURETIC PEPTIDE: Pro B Natriuretic peptide (BNP): 90 pg/mL (ref 0.0–100.0)

## 2010-04-18 LAB — GLUCOSE, CAPILLARY: Glucose-Capillary: 291 mg/dL — ABNORMAL HIGH (ref 70–99)

## 2010-04-22 LAB — BASIC METABOLIC PANEL
BUN: 17 mg/dL (ref 6–23)
BUN: 19 mg/dL (ref 6–23)
BUN: 20 mg/dL (ref 6–23)
BUN: 23 mg/dL (ref 6–23)
BUN: 23 mg/dL (ref 6–23)
BUN: 24 mg/dL — ABNORMAL HIGH (ref 6–23)
CO2: 27 mEq/L (ref 19–32)
CO2: 28 mEq/L (ref 19–32)
CO2: 29 mEq/L (ref 19–32)
CO2: 30 mEq/L (ref 19–32)
CO2: 30 mEq/L (ref 19–32)
CO2: 33 mEq/L — ABNORMAL HIGH (ref 19–32)
Calcium: 8.2 mg/dL — ABNORMAL LOW (ref 8.4–10.5)
Calcium: 8.4 mg/dL (ref 8.4–10.5)
Calcium: 8.4 mg/dL (ref 8.4–10.5)
Calcium: 8.6 mg/dL (ref 8.4–10.5)
Calcium: 8.7 mg/dL (ref 8.4–10.5)
Calcium: 8.7 mg/dL (ref 8.4–10.5)
Chloride: 101 mEq/L (ref 96–112)
Chloride: 102 mEq/L (ref 96–112)
Chloride: 104 mEq/L (ref 96–112)
Chloride: 107 mEq/L (ref 96–112)
Chloride: 98 mEq/L (ref 96–112)
Chloride: 99 mEq/L (ref 96–112)
Creatinine, Ser: 1.5 mg/dL (ref 0.4–1.5)
Creatinine, Ser: 1.55 mg/dL — ABNORMAL HIGH (ref 0.4–1.5)
Creatinine, Ser: 1.69 mg/dL — ABNORMAL HIGH (ref 0.4–1.5)
Creatinine, Ser: 1.77 mg/dL — ABNORMAL HIGH (ref 0.4–1.5)
Creatinine, Ser: 1.79 mg/dL — ABNORMAL HIGH (ref 0.4–1.5)
Creatinine, Ser: 1.81 mg/dL — ABNORMAL HIGH (ref 0.4–1.5)
GFR calc Af Amer: 46 mL/min — ABNORMAL LOW (ref >60)
GFR calc Af Amer: 46 mL/min — ABNORMAL LOW (ref >60)
GFR calc Af Amer: 47 mL/min — ABNORMAL LOW (ref >60)
GFR calc Af Amer: 49 mL/min — ABNORMAL LOW (ref >60)
GFR calc Af Amer: 55 mL/min — ABNORMAL LOW (ref >60)
GFR calc Af Amer: 57 mL/min — ABNORMAL LOW (ref >60)
GFR calc non Af Amer: 38 mL/min — ABNORMAL LOW (ref >60)
GFR calc non Af Amer: 38 mL/min — ABNORMAL LOW (ref >60)
GFR calc non Af Amer: 39 mL/min — ABNORMAL LOW (ref >60)
GFR calc non Af Amer: 41 mL/min — ABNORMAL LOW (ref >60)
GFR calc non Af Amer: 45 mL/min — ABNORMAL LOW (ref >60)
GFR calc non Af Amer: 47 mL/min — ABNORMAL LOW (ref >60)
Glucose, Bld: 107 mg/dL — ABNORMAL HIGH (ref 70–99)
Glucose, Bld: 140 mg/dL — ABNORMAL HIGH (ref 70–99)
Glucose, Bld: 143 mg/dL — ABNORMAL HIGH (ref 70–99)
Glucose, Bld: 158 mg/dL — ABNORMAL HIGH (ref 70–99)
Glucose, Bld: 174 mg/dL — ABNORMAL HIGH (ref 70–99)
Glucose, Bld: 183 mg/dL — ABNORMAL HIGH (ref 70–99)
Potassium: 3.4 mEq/L — ABNORMAL LOW (ref 3.5–5.1)
Potassium: 3.5 mEq/L (ref 3.5–5.1)
Potassium: 3.5 mEq/L (ref 3.5–5.1)
Potassium: 3.6 mEq/L (ref 3.5–5.1)
Potassium: 3.6 mEq/L (ref 3.5–5.1)
Potassium: 4.3 mEq/L (ref 3.5–5.1)
Sodium: 136 mEq/L (ref 135–145)
Sodium: 137 mEq/L (ref 135–145)
Sodium: 138 mEq/L (ref 135–145)
Sodium: 139 mEq/L (ref 135–145)
Sodium: 139 mEq/L (ref 135–145)
Sodium: 140 mEq/L (ref 135–145)

## 2010-04-22 LAB — CBC
HCT: 27.7 % — ABNORMAL LOW (ref 39.0–52.0)
HCT: 27.7 % — ABNORMAL LOW (ref 39.0–52.0)
HCT: 27.9 % — ABNORMAL LOW (ref 39.0–52.0)
HCT: 28.9 % — ABNORMAL LOW (ref 39.0–52.0)
HCT: 30.2 % — ABNORMAL LOW (ref 39.0–52.0)
HCT: 31.6 % — ABNORMAL LOW (ref 39.0–52.0)
HCT: 36.6 % — ABNORMAL LOW (ref 39.0–52.0)
Hemoglobin: 10.5 g/dL — ABNORMAL LOW (ref 13.0–17.0)
Hemoglobin: 10.6 g/dL — ABNORMAL LOW (ref 13.0–17.0)
Hemoglobin: 12.6 g/dL — ABNORMAL LOW (ref 13.0–17.0)
Hemoglobin: 9.5 g/dL — ABNORMAL LOW (ref 13.0–17.0)
Hemoglobin: 9.5 g/dL — ABNORMAL LOW (ref 13.0–17.0)
Hemoglobin: 9.7 g/dL — ABNORMAL LOW (ref 13.0–17.0)
Hemoglobin: 9.9 g/dL — ABNORMAL LOW (ref 13.0–17.0)
MCHC: 33.4 g/dL (ref 30.0–36.0)
MCHC: 34.2 g/dL (ref 30.0–36.0)
MCHC: 34.3 g/dL (ref 30.0–36.0)
MCHC: 34.4 g/dL (ref 30.0–36.0)
MCHC: 34.5 g/dL (ref 30.0–36.0)
MCHC: 34.6 g/dL (ref 30.0–36.0)
MCHC: 34.7 g/dL (ref 30.0–36.0)
MCV: 96.3 fL (ref 78.0–100.0)
MCV: 96.7 fL (ref 78.0–100.0)
MCV: 96.9 fL (ref 78.0–100.0)
MCV: 97.3 fL (ref 78.0–100.0)
MCV: 97.3 fL (ref 78.0–100.0)
MCV: 97.4 fL (ref 78.0–100.0)
MCV: 97.7 fL (ref 78.0–100.0)
Platelets: 116 10*3/uL — ABNORMAL LOW (ref 150–400)
Platelets: 118 10*3/uL — ABNORMAL LOW (ref 150–400)
Platelets: 127 10*3/uL — ABNORMAL LOW (ref 150–400)
Platelets: 130 10*3/uL — ABNORMAL LOW (ref 150–400)
Platelets: 138 10*3/uL — ABNORMAL LOW (ref 150–400)
Platelets: 164 10*3/uL (ref 150–400)
Platelets: 175 10*3/uL (ref 150–400)
RBC: 2.85 MIL/uL — ABNORMAL LOW (ref 4.22–5.81)
RBC: 2.87 MIL/uL — ABNORMAL LOW (ref 4.22–5.81)
RBC: 2.9 MIL/uL — ABNORMAL LOW (ref 4.22–5.81)
RBC: 2.96 MIL/uL — ABNORMAL LOW (ref 4.22–5.81)
RBC: 3.11 MIL/uL — ABNORMAL LOW (ref 4.22–5.81)
RBC: 3.26 MIL/uL — ABNORMAL LOW (ref 4.22–5.81)
RBC: 3.75 MIL/uL — ABNORMAL LOW (ref 4.22–5.81)
RDW: 13.4 % (ref 11.5–15.5)
RDW: 13.4 % (ref 11.5–15.5)
RDW: 13.5 % (ref 11.5–15.5)
RDW: 13.5 % (ref 11.5–15.5)
RDW: 13.6 % (ref 11.5–15.5)
RDW: 13.6 % (ref 11.5–15.5)
RDW: 14 % (ref 11.5–15.5)
WBC: 10.6 10*3/uL — ABNORMAL HIGH (ref 4.0–10.5)
WBC: 10.8 10*3/uL — ABNORMAL HIGH (ref 4.0–10.5)
WBC: 11.9 10*3/uL — ABNORMAL HIGH (ref 4.0–10.5)
WBC: 9 10*3/uL (ref 4.0–10.5)
WBC: 9.5 10*3/uL (ref 4.0–10.5)
WBC: 9.7 10*3/uL (ref 4.0–10.5)
WBC: 9.9 10*3/uL (ref 4.0–10.5)

## 2010-04-22 LAB — GLUCOSE, CAPILLARY
Glucose-Capillary: 112 mg/dL — ABNORMAL HIGH (ref 70–99)
Glucose-Capillary: 131 mg/dL — ABNORMAL HIGH (ref 70–99)
Glucose-Capillary: 131 mg/dL — ABNORMAL HIGH (ref 70–99)
Glucose-Capillary: 133 mg/dL — ABNORMAL HIGH (ref 70–99)
Glucose-Capillary: 133 mg/dL — ABNORMAL HIGH (ref 70–99)
Glucose-Capillary: 150 mg/dL — ABNORMAL HIGH (ref 70–99)
Glucose-Capillary: 151 mg/dL — ABNORMAL HIGH (ref 70–99)
Glucose-Capillary: 155 mg/dL — ABNORMAL HIGH (ref 70–99)
Glucose-Capillary: 164 mg/dL — ABNORMAL HIGH (ref 70–99)
Glucose-Capillary: 164 mg/dL — ABNORMAL HIGH (ref 70–99)
Glucose-Capillary: 171 mg/dL — ABNORMAL HIGH (ref 70–99)
Glucose-Capillary: 177 mg/dL — ABNORMAL HIGH (ref 70–99)
Glucose-Capillary: 179 mg/dL — ABNORMAL HIGH (ref 70–99)
Glucose-Capillary: 186 mg/dL — ABNORMAL HIGH (ref 70–99)
Glucose-Capillary: 190 mg/dL — ABNORMAL HIGH (ref 70–99)
Glucose-Capillary: 192 mg/dL — ABNORMAL HIGH (ref 70–99)
Glucose-Capillary: 195 mg/dL — ABNORMAL HIGH (ref 70–99)
Glucose-Capillary: 222 mg/dL — ABNORMAL HIGH (ref 70–99)
Glucose-Capillary: 226 mg/dL — ABNORMAL HIGH (ref 70–99)
Glucose-Capillary: 237 mg/dL — ABNORMAL HIGH (ref 70–99)
Glucose-Capillary: 247 mg/dL — ABNORMAL HIGH (ref 70–99)
Glucose-Capillary: 265 mg/dL — ABNORMAL HIGH (ref 70–99)
Glucose-Capillary: 265 mg/dL — ABNORMAL HIGH (ref 70–99)
Glucose-Capillary: 303 mg/dL — ABNORMAL HIGH (ref 70–99)

## 2010-04-22 LAB — POCT I-STAT 3, ART BLOOD GAS (G3+)
Acid-Base Excess: 2 mmol/L (ref 0.0–2.0)
Bicarbonate: 28 mEq/L — ABNORMAL HIGH (ref 20.0–24.0)
O2 Saturation: 94 %
TCO2: 29 mmol/L (ref 0–100)
pCO2 arterial: 48.3 mmHg — ABNORMAL HIGH (ref 35.0–45.0)
pH, Arterial: 7.371 (ref 7.350–7.450)
pO2, Arterial: 72 mmHg — ABNORMAL LOW (ref 80.0–100.0)

## 2010-04-22 LAB — MRSA PCR SCREENING: MRSA by PCR: NEGATIVE

## 2010-04-22 LAB — POCT I-STAT 3, VENOUS BLOOD GAS (G3P V)
Bicarbonate: 26.9 mEq/L — ABNORMAL HIGH (ref 20.0–24.0)
O2 Saturation: 56 %
TCO2: 28 mmol/L (ref 0–100)
pCO2, Ven: 51.6 mmHg — ABNORMAL HIGH (ref 45.0–50.0)
pH, Ven: 7.325 — ABNORMAL HIGH (ref 7.250–7.300)
pO2, Ven: 32 mmHg (ref 30.0–45.0)

## 2010-04-22 LAB — APTT: aPTT: 29 seconds (ref 24–37)

## 2010-04-22 LAB — PROTIME-INR
INR: 1.16 (ref 0.00–1.49)
Prothrombin Time: 14.7 seconds (ref 11.6–15.2)

## 2010-04-22 LAB — HEMOGLOBIN AND HEMATOCRIT, BLOOD
HCT: 35 % — ABNORMAL LOW (ref 39.0–52.0)
Hemoglobin: 11.8 g/dL — ABNORMAL LOW (ref 13.0–17.0)

## 2010-05-05 LAB — BASIC METABOLIC PANEL
BUN: 22 mg/dL (ref 6–23)
BUN: 23 mg/dL (ref 6–23)
BUN: 26 mg/dL — ABNORMAL HIGH (ref 6–23)
BUN: 28 mg/dL — ABNORMAL HIGH (ref 6–23)
CO2: 28 mEq/L (ref 19–32)
CO2: 28 mEq/L (ref 19–32)
CO2: 30 mEq/L (ref 19–32)
CO2: 30 mEq/L (ref 19–32)
Calcium: 8.8 mg/dL (ref 8.4–10.5)
Calcium: 8.9 mg/dL (ref 8.4–10.5)
Calcium: 9 mg/dL (ref 8.4–10.5)
Calcium: 9.1 mg/dL (ref 8.4–10.5)
Chloride: 100 mEq/L (ref 96–112)
Chloride: 102 mEq/L (ref 96–112)
Chloride: 99 mEq/L (ref 96–112)
Chloride: 99 mEq/L (ref 96–112)
Creatinine, Ser: 1.54 mg/dL — ABNORMAL HIGH (ref 0.4–1.5)
Creatinine, Ser: 1.6 mg/dL — ABNORMAL HIGH (ref 0.4–1.5)
Creatinine, Ser: 1.68 mg/dL — ABNORMAL HIGH (ref 0.4–1.5)
Creatinine, Ser: 1.78 mg/dL — ABNORMAL HIGH (ref 0.4–1.5)
GFR calc Af Amer: 47 mL/min — ABNORMAL LOW (ref >60)
GFR calc Af Amer: 50 mL/min — ABNORMAL LOW (ref >60)
GFR calc Af Amer: 53 mL/min — ABNORMAL LOW (ref >60)
GFR calc Af Amer: 55 mL/min — ABNORMAL LOW (ref >60)
GFR calc non Af Amer: 38 mL/min — ABNORMAL LOW (ref >60)
GFR calc non Af Amer: 41 mL/min — ABNORMAL LOW (ref >60)
GFR calc non Af Amer: 43 mL/min — ABNORMAL LOW (ref >60)
GFR calc non Af Amer: 45 mL/min — ABNORMAL LOW (ref >60)
Glucose, Bld: 100 mg/dL — ABNORMAL HIGH (ref 70–99)
Glucose, Bld: 127 mg/dL — ABNORMAL HIGH (ref 70–99)
Glucose, Bld: 133 mg/dL — ABNORMAL HIGH (ref 70–99)
Glucose, Bld: 141 mg/dL — ABNORMAL HIGH (ref 70–99)
Potassium: 3.5 mEq/L (ref 3.5–5.1)
Potassium: 3.8 mEq/L (ref 3.5–5.1)
Potassium: 3.9 mEq/L (ref 3.5–5.1)
Potassium: 4.1 mEq/L (ref 3.5–5.1)
Sodium: 134 mEq/L — ABNORMAL LOW (ref 135–145)
Sodium: 137 mEq/L (ref 135–145)
Sodium: 137 mEq/L (ref 135–145)
Sodium: 138 mEq/L (ref 135–145)

## 2010-05-05 LAB — GLUCOSE, CAPILLARY
Glucose-Capillary: 104 mg/dL — ABNORMAL HIGH (ref 70–99)
Glucose-Capillary: 118 mg/dL — ABNORMAL HIGH (ref 70–99)
Glucose-Capillary: 119 mg/dL — ABNORMAL HIGH (ref 70–99)
Glucose-Capillary: 126 mg/dL — ABNORMAL HIGH (ref 70–99)
Glucose-Capillary: 126 mg/dL — ABNORMAL HIGH (ref 70–99)
Glucose-Capillary: 126 mg/dL — ABNORMAL HIGH (ref 70–99)
Glucose-Capillary: 129 mg/dL — ABNORMAL HIGH (ref 70–99)
Glucose-Capillary: 136 mg/dL — ABNORMAL HIGH (ref 70–99)
Glucose-Capillary: 147 mg/dL — ABNORMAL HIGH (ref 70–99)
Glucose-Capillary: 184 mg/dL — ABNORMAL HIGH (ref 70–99)
Glucose-Capillary: 210 mg/dL — ABNORMAL HIGH (ref 70–99)
Glucose-Capillary: 60 mg/dL — ABNORMAL LOW (ref 70–99)
Glucose-Capillary: 96 mg/dL (ref 70–99)

## 2010-05-05 LAB — PROTIME-INR
INR: 1.37 (ref 0.00–1.49)
INR: 1.58 — ABNORMAL HIGH (ref 0.00–1.49)
INR: 1.81 — ABNORMAL HIGH (ref 0.00–1.49)
INR: 2.31 — ABNORMAL HIGH (ref 0.00–1.49)
Prothrombin Time: 16.8 seconds — ABNORMAL HIGH (ref 11.6–15.2)
Prothrombin Time: 18.7 seconds — ABNORMAL HIGH (ref 11.6–15.2)
Prothrombin Time: 20.8 seconds — ABNORMAL HIGH (ref 11.6–15.2)
Prothrombin Time: 25.2 seconds — ABNORMAL HIGH (ref 11.6–15.2)

## 2010-05-06 ENCOUNTER — Telehealth: Payer: Self-pay | Admitting: *Deleted

## 2010-05-06 LAB — POCT I-STAT 3, VENOUS BLOOD GAS (G3P V)
Acid-Base Excess: 2 mmol/L (ref 0.0–2.0)
Bicarbonate: 29.1 mEq/L — ABNORMAL HIGH (ref 20.0–24.0)
O2 Saturation: 60 %
TCO2: 31 mmol/L (ref 0–100)
pCO2, Ven: 53.1 mmHg — ABNORMAL HIGH (ref 45.0–50.0)
pH, Ven: 7.348 — ABNORMAL HIGH (ref 7.250–7.300)
pO2, Ven: 34 mmHg (ref 30.0–45.0)

## 2010-05-06 LAB — PROTIME-INR
INR: 1.13 (ref 0.00–1.49)
INR: 1.21 (ref 0.00–1.49)
INR: 1.31 (ref 0.00–1.49)
Prothrombin Time: 14.4 seconds (ref 11.6–15.2)
Prothrombin Time: 15.2 seconds (ref 11.6–15.2)
Prothrombin Time: 16.2 seconds — ABNORMAL HIGH (ref 11.6–15.2)

## 2010-05-06 LAB — BASIC METABOLIC PANEL
BUN: 16 mg/dL (ref 6–23)
BUN: 17 mg/dL (ref 6–23)
BUN: 19 mg/dL (ref 6–23)
BUN: 19 mg/dL (ref 6–23)
BUN: 20 mg/dL (ref 6–23)
BUN: 20 mg/dL (ref 6–23)
CO2: 24 mEq/L (ref 19–32)
CO2: 25 mEq/L (ref 19–32)
CO2: 26 mEq/L (ref 19–32)
CO2: 27 mEq/L (ref 19–32)
CO2: 28 mEq/L (ref 19–32)
CO2: 29 mEq/L (ref 19–32)
Calcium: 8.1 mg/dL — ABNORMAL LOW (ref 8.4–10.5)
Calcium: 8.3 mg/dL — ABNORMAL LOW (ref 8.4–10.5)
Calcium: 8.4 mg/dL (ref 8.4–10.5)
Calcium: 8.5 mg/dL (ref 8.4–10.5)
Calcium: 8.8 mg/dL (ref 8.4–10.5)
Calcium: 9 mg/dL (ref 8.4–10.5)
Chloride: 100 mEq/L (ref 96–112)
Chloride: 100 mEq/L (ref 96–112)
Chloride: 103 mEq/L (ref 96–112)
Chloride: 103 mEq/L (ref 96–112)
Chloride: 104 mEq/L (ref 96–112)
Chloride: 98 mEq/L (ref 96–112)
Creatinine, Ser: 1.41 mg/dL (ref 0.4–1.5)
Creatinine, Ser: 1.44 mg/dL (ref 0.4–1.5)
Creatinine, Ser: 1.51 mg/dL — ABNORMAL HIGH (ref 0.4–1.5)
Creatinine, Ser: 1.58 mg/dL — ABNORMAL HIGH (ref 0.4–1.5)
Creatinine, Ser: 1.59 mg/dL — ABNORMAL HIGH (ref 0.4–1.5)
Creatinine, Ser: 1.61 mg/dL — ABNORMAL HIGH (ref 0.4–1.5)
GFR calc Af Amer: 52 mL/min — ABNORMAL LOW (ref >60)
GFR calc Af Amer: 53 mL/min — ABNORMAL LOW (ref >60)
GFR calc Af Amer: 53 mL/min — ABNORMAL LOW (ref >60)
GFR calc Af Amer: 56 mL/min — ABNORMAL LOW (ref >60)
GFR calc Af Amer: 59 mL/min — ABNORMAL LOW (ref >60)
GFR calc Af Amer: >60 mL/min (ref >60)
GFR calc non Af Amer: 43 mL/min — ABNORMAL LOW (ref >60)
GFR calc non Af Amer: 44 mL/min — ABNORMAL LOW (ref >60)
GFR calc non Af Amer: 44 mL/min — ABNORMAL LOW (ref >60)
GFR calc non Af Amer: 46 mL/min — ABNORMAL LOW (ref >60)
GFR calc non Af Amer: 49 mL/min — ABNORMAL LOW (ref >60)
GFR calc non Af Amer: 50 mL/min — ABNORMAL LOW (ref >60)
Glucose, Bld: 109 mg/dL — ABNORMAL HIGH (ref 70–99)
Glucose, Bld: 121 mg/dL — ABNORMAL HIGH (ref 70–99)
Glucose, Bld: 144 mg/dL — ABNORMAL HIGH (ref 70–99)
Glucose, Bld: 200 mg/dL — ABNORMAL HIGH (ref 70–99)
Glucose, Bld: 91 mg/dL (ref 70–99)
Glucose, Bld: 99 mg/dL (ref 70–99)
Potassium: 3.9 mEq/L (ref 3.5–5.1)
Potassium: 4 mEq/L (ref 3.5–5.1)
Potassium: 4.1 mEq/L (ref 3.5–5.1)
Potassium: 4.1 mEq/L (ref 3.5–5.1)
Potassium: 4.1 mEq/L (ref 3.5–5.1)
Potassium: 5 mEq/L (ref 3.5–5.1)
Sodium: 132 mEq/L — ABNORMAL LOW (ref 135–145)
Sodium: 132 mEq/L — ABNORMAL LOW (ref 135–145)
Sodium: 135 mEq/L (ref 135–145)
Sodium: 137 mEq/L (ref 135–145)
Sodium: 138 mEq/L (ref 135–145)
Sodium: 138 mEq/L (ref 135–145)

## 2010-05-06 LAB — GLUCOSE, CAPILLARY
Glucose-Capillary: 102 mg/dL — ABNORMAL HIGH (ref 70–99)
Glucose-Capillary: 106 mg/dL — ABNORMAL HIGH (ref 70–99)
Glucose-Capillary: 111 mg/dL — ABNORMAL HIGH (ref 70–99)
Glucose-Capillary: 112 mg/dL — ABNORMAL HIGH (ref 70–99)
Glucose-Capillary: 113 mg/dL — ABNORMAL HIGH (ref 70–99)
Glucose-Capillary: 114 mg/dL — ABNORMAL HIGH (ref 70–99)
Glucose-Capillary: 116 mg/dL — ABNORMAL HIGH (ref 70–99)
Glucose-Capillary: 118 mg/dL — ABNORMAL HIGH (ref 70–99)
Glucose-Capillary: 122 mg/dL — ABNORMAL HIGH (ref 70–99)
Glucose-Capillary: 128 mg/dL — ABNORMAL HIGH (ref 70–99)
Glucose-Capillary: 131 mg/dL — ABNORMAL HIGH (ref 70–99)
Glucose-Capillary: 142 mg/dL — ABNORMAL HIGH (ref 70–99)
Glucose-Capillary: 143 mg/dL — ABNORMAL HIGH (ref 70–99)
Glucose-Capillary: 149 mg/dL — ABNORMAL HIGH (ref 70–99)
Glucose-Capillary: 151 mg/dL — ABNORMAL HIGH (ref 70–99)
Glucose-Capillary: 152 mg/dL — ABNORMAL HIGH (ref 70–99)
Glucose-Capillary: 153 mg/dL — ABNORMAL HIGH (ref 70–99)
Glucose-Capillary: 157 mg/dL — ABNORMAL HIGH (ref 70–99)
Glucose-Capillary: 158 mg/dL — ABNORMAL HIGH (ref 70–99)
Glucose-Capillary: 178 mg/dL — ABNORMAL HIGH (ref 70–99)
Glucose-Capillary: 62 mg/dL — ABNORMAL LOW (ref 70–99)
Glucose-Capillary: 69 mg/dL — ABNORMAL LOW (ref 70–99)
Glucose-Capillary: 69 mg/dL — ABNORMAL LOW (ref 70–99)
Glucose-Capillary: 74 mg/dL (ref 70–99)
Glucose-Capillary: 94 mg/dL (ref 70–99)
Glucose-Capillary: 99 mg/dL (ref 70–99)

## 2010-05-06 LAB — CBC
HCT: 41 % (ref 39.0–52.0)
HCT: 41.6 % (ref 39.0–52.0)
HCT: 42.8 % (ref 39.0–52.0)
HCT: 44.4 % (ref 39.0–52.0)
HCT: 44.5 % (ref 39.0–52.0)
Hemoglobin: 14.1 g/dL (ref 13.0–17.0)
Hemoglobin: 14.1 g/dL (ref 13.0–17.0)
Hemoglobin: 14.7 g/dL (ref 13.0–17.0)
Hemoglobin: 15 g/dL (ref 13.0–17.0)
Hemoglobin: 15.1 g/dL (ref 13.0–17.0)
MCHC: 33.9 g/dL (ref 30.0–36.0)
MCHC: 34 g/dL (ref 30.0–36.0)
MCHC: 34 g/dL (ref 30.0–36.0)
MCHC: 34.3 g/dL (ref 30.0–36.0)
MCHC: 34.3 g/dL (ref 30.0–36.0)
MCV: 100.4 fL — ABNORMAL HIGH (ref 78.0–100.0)
MCV: 100.6 fL — ABNORMAL HIGH (ref 78.0–100.0)
MCV: 100.7 fL — ABNORMAL HIGH (ref 78.0–100.0)
MCV: 101.2 fL — ABNORMAL HIGH (ref 78.0–100.0)
MCV: 101.4 fL — ABNORMAL HIGH (ref 78.0–100.0)
Platelets: 139 10*3/uL — ABNORMAL LOW (ref 150–400)
Platelets: 140 10*3/uL — ABNORMAL LOW (ref 150–400)
Platelets: 145 10*3/uL — ABNORMAL LOW (ref 150–400)
Platelets: 147 10*3/uL — ABNORMAL LOW (ref 150–400)
Platelets: 157 10*3/uL (ref 150–400)
RBC: 4.08 MIL/uL — ABNORMAL LOW (ref 4.22–5.81)
RBC: 4.1 MIL/uL — ABNORMAL LOW (ref 4.22–5.81)
RBC: 4.25 MIL/uL (ref 4.22–5.81)
RBC: 4.39 MIL/uL (ref 4.22–5.81)
RBC: 4.44 MIL/uL (ref 4.22–5.81)
RDW: 14.1 % (ref 11.5–15.5)
RDW: 14.4 % (ref 11.5–15.5)
RDW: 14.7 % (ref 11.5–15.5)
RDW: 15 % (ref 11.5–15.5)
RDW: 15.1 % (ref 11.5–15.5)
WBC: 6.4 10*3/uL (ref 4.0–10.5)
WBC: 7.4 10*3/uL (ref 4.0–10.5)
WBC: 7.5 10*3/uL (ref 4.0–10.5)
WBC: 7.5 10*3/uL (ref 4.0–10.5)
WBC: 7.5 10*3/uL (ref 4.0–10.5)

## 2010-05-06 LAB — ANTI-DNASE B ANTIBODY: Anti-DNAse-B: <70 U/mL (ref 0–120)

## 2010-05-06 LAB — POCT I-STAT 3, ART BLOOD GAS (G3+)
Acid-Base Excess: 2 mmol/L (ref 0.0–2.0)
Bicarbonate: 28.8 mEq/L — ABNORMAL HIGH (ref 20.0–24.0)
O2 Saturation: 92 %
TCO2: 30 mmol/L (ref 0–100)
pCO2 arterial: 49.5 mmHg — ABNORMAL HIGH (ref 35.0–45.0)
pH, Arterial: 7.374 (ref 7.350–7.450)
pO2, Arterial: 67 mmHg — ABNORMAL LOW (ref 80.0–100.0)

## 2010-05-06 LAB — BLOOD GAS, ARTERIAL
Acid-Base Excess: 2.2 mmol/L — ABNORMAL HIGH (ref 0.0–2.0)
Bicarbonate: 27.2 mEq/L — ABNORMAL HIGH (ref 20.0–24.0)
FIO2: 0.21 %
O2 Saturation: 97.7 %
Patient temperature: 99.1
TCO2: 28.7 mmol/L (ref 0–100)
pCO2 arterial: 50.8 mmHg — ABNORMAL HIGH (ref 35.0–45.0)
pH, Arterial: 7.35 (ref 7.350–7.450)
pO2, Arterial: 73.4 mmHg — ABNORMAL LOW (ref 80.0–100.0)

## 2010-05-06 LAB — COMPREHENSIVE METABOLIC PANEL
ALT: 46 U/L (ref 0–53)
AST: 28 U/L (ref 0–37)
Albumin: 3.5 g/dL (ref 3.5–5.2)
Alkaline Phosphatase: 49 U/L (ref 39–117)
BUN: 21 mg/dL (ref 6–23)
CO2: 29 mEq/L (ref 19–32)
Calcium: 8.8 mg/dL (ref 8.4–10.5)
Chloride: 100 mEq/L (ref 96–112)
Creatinine, Ser: 1.52 mg/dL — ABNORMAL HIGH (ref 0.4–1.5)
GFR calc Af Amer: 56 mL/min — ABNORMAL LOW (ref >60)
GFR calc non Af Amer: 46 mL/min — ABNORMAL LOW (ref >60)
Glucose, Bld: 156 mg/dL — ABNORMAL HIGH (ref 70–99)
Potassium: 4.1 mEq/L (ref 3.5–5.1)
Sodium: 137 mEq/L (ref 135–145)
Total Bilirubin: 1.2 mg/dL (ref 0.3–1.2)
Total Protein: 6.6 g/dL (ref 6.0–8.3)

## 2010-05-06 LAB — HIGH SENSITIVITY CRP: CRP, High Sensitivity: 68.5 mg/L — ABNORMAL HIGH

## 2010-05-06 LAB — HIV ANTIBODY (ROUTINE TESTING W REFLEX): HIV: NONREACTIVE

## 2010-05-06 LAB — LIPID PANEL
Cholesterol: 187 mg/dL (ref 0–200)
HDL: 22 mg/dL — ABNORMAL LOW (ref >39)
LDL Cholesterol: 129 mg/dL — ABNORMAL HIGH (ref 0–99)
Total CHOL/HDL Ratio: 8.5 RATIO
Triglycerides: 182 mg/dL — ABNORMAL HIGH (ref <150)
VLDL: 36 mg/dL (ref 0–40)

## 2010-05-06 LAB — APTT: aPTT: 37 seconds (ref 24–37)

## 2010-05-06 LAB — ANA: Anti Nuclear Antibody(ANA): NEGATIVE

## 2010-05-06 LAB — BRAIN NATRIURETIC PEPTIDE
Pro B Natriuretic peptide (BNP): 78 pg/mL (ref 0.0–100.0)
Pro B Natriuretic peptide (BNP): 97 pg/mL (ref 0.0–100.0)

## 2010-05-06 LAB — MPO/PR-3 (ANCA) ANTIBODIES

## 2010-05-06 LAB — SEDIMENTATION RATE: Sed Rate: 16 mm/hr (ref 0–16)

## 2010-05-06 LAB — ANTI-NEUTROPHIL ANTIBODY

## 2010-05-06 LAB — SJOGRENS SYNDROME-A EXTRACTABLE NUCLEAR ANTIBODY: SSA (Ro) (ENA) Antibody, IgG: <0.2 AI (ref <1.0)

## 2010-05-06 LAB — SJOGRENS SYNDROME-B EXTRACTABLE NUCLEAR ANTIBODY: SSB (La) (ENA) Antibody, IgG: <0.2 AI (ref <1.0)

## 2010-05-06 NOTE — Telephone Encounter (Signed)
Summary of Call: i saw him 13 months ago and he was supposed to see me in 6 months. Please ensure fu Initial call taken by: Kalman Shan MD,  April 20, 2010 1:28 PM  Spoke with pt and he states he is no longer having any SOB or cough and doe snot feel need for f/u. I advised if his symptoms return to give [place] a call. Carron Curie, CMA

## 2010-05-24 ENCOUNTER — Other Ambulatory Visit: Payer: Self-pay | Admitting: Cardiology

## 2010-05-24 DIAGNOSIS — I10 Essential (primary) hypertension: Secondary | ICD-10-CM

## 2010-05-24 NOTE — Telephone Encounter (Signed)
escribe medication per fax request  

## 2010-05-31 ENCOUNTER — Other Ambulatory Visit: Payer: Self-pay | Admitting: *Deleted

## 2010-05-31 MED ORDER — ROSUVASTATIN CALCIUM 20 MG PO TABS: 20.0000 mg | ORAL_TABLET | Freq: Every day | ORAL | Status: DC

## 2010-05-31 NOTE — Telephone Encounter (Signed)
Medication Refill

## 2010-06-15 NOTE — Procedures (Signed)
DUPLEX DEEP VENOUS EXAM - LOWER EXTREMITY   INDICATION:  Bilateral leg swelling, left > right for three months.   HISTORY:  Edema:  Bilateral left > right.  Trauma/Surgery:  No.  Pain:  Patient states that they ache and feel tight.  PE:  No.  Previous DVT:  No.  Anticoagulants:  Patient takes aspirin 81 mg 2 tablets daily.  Other:   DUPLEX EXAM:                CFV   SFV   PopV  PTV    GSV                R  L  R  L  R  L  R   L  R  L  Thrombosis    o  o  o  o  o  o  o   o  o  o  Spontaneous   +  +  +  +  +  +  +   +  +  +  Phasic        +  +  +  +  +  +  +   +  Augmentation  +  +  +  +  +  +  +   +  +  +  Compressible  +  +  +  +  +  +  +   +  +  +  Competent     +  +  +  +  +  +  +   +  +  +   Legend:  + - yes  o - no  p - partial  D - decreased   IMPRESSION:  No evidence of deep venous thrombosis bilaterally.   A preliminary copy was faxed to Dr. Silvano Rusk office.    _____________________________  Di Kindle. Edilia Bo, M.D.   DP/MEDQ  D:  04/06/2007  T:  04/06/2007  Job:  [id number]

## 2010-08-16 ENCOUNTER — Encounter: Payer: Self-pay | Admitting: Cardiology

## 2010-08-17 ENCOUNTER — Encounter: Payer: Self-pay | Admitting: Cardiology

## 2010-08-20 ENCOUNTER — Encounter: Payer: Self-pay | Admitting: Cardiology

## 2010-08-20 ENCOUNTER — Ambulatory Visit (INDEPENDENT_AMBULATORY_CARE_PROVIDER_SITE_OTHER): Payer: No Typology Code available for payment source | Admitting: Cardiology

## 2010-08-20 DIAGNOSIS — R072 Precordial pain: Secondary | ICD-10-CM

## 2010-08-20 DIAGNOSIS — I2510 Atherosclerotic heart disease of native coronary artery without angina pectoris: Secondary | ICD-10-CM

## 2010-08-20 DIAGNOSIS — E785 Hyperlipidemia, unspecified: Secondary | ICD-10-CM

## 2010-08-20 DIAGNOSIS — I2699 Other pulmonary embolism without acute cor pulmonale: Secondary | ICD-10-CM

## 2010-08-20 DIAGNOSIS — R079 Chest pain, unspecified: Secondary | ICD-10-CM

## 2010-08-20 DIAGNOSIS — I779 Disorder of arteries and arterioles, unspecified: Secondary | ICD-10-CM

## 2010-08-20 NOTE — Assessment & Plan Note (Signed)
He has occasional atypical chest pain. I've recommended a followup Lexus scan Myoview study to evaluate his ischemic risk. He is on chronic Plavix, metoprolol, and amlodipine.

## 2010-08-20 NOTE — Assessment & Plan Note (Signed)
We will obtain carotid Doppler studies to further evaluate his history of carotid stenosis.

## 2010-08-20 NOTE — Assessment & Plan Note (Signed)
He is on chronic Coumadin therapy. He has a history of moderate to severe pulmonary hypertension and is followed by pulmonary.

## 2010-08-20 NOTE — Progress Notes (Signed)
Paul Chen Date of Birth: Oct 16, 1940   History of Present Illness: Paul Chen is seen for followup today. He has had intermittent symptoms of minor chest pain. He states that this is nothing serious. He describes this as a stabbing discomfort in the left anterior chest. It is random and doesn't last long. He admits that he has had poor eating habits and has gained 12 pounds. His sugars fluctuate quite a bit. He notes that in the past he was told that he had a 70% carotid blockage on the left and this has not had followup in some time. He has chronic shortness of breath which is really unchanged from baseline.  Current Outpatient Prescriptions on File Prior to Visit  Medication Sig Dispense Refill  . allopurinol (ZYLOPRIM) 100 MG tablet Take 100 mg by mouth daily.        Marland Kitchen amLODipine (NORVASC) 10 MG tablet TAKE ONE TABLET BY MOUTH DAILY  90 tablet  3  . busPIRone (BUSPAR) 30 MG tablet Take 15 mg by mouth 2 (two) times daily.        . Cholecalciferol (VITAMIN D PO) Take by mouth.        . clopidogrel (PLAVIX) 75 MG tablet Take 75 mg by mouth daily.        Marland Kitchen doxazosin (CARDURA) 4 MG tablet Take 4 mg by mouth daily.        . DULoxetine (CYMBALTA) 60 MG capsule Take 60 mg by mouth daily.        . furosemide (LASIX) 40 MG tablet Take 40 mg by mouth 2 (two) times daily.        Marland Kitchen gabapentin (NEURONTIN) 300 MG capsule Take 300 mg by mouth 3 (three) times daily.        Marland Kitchen glipiZIDE (GLUCOTROL XL) 2.5 MG 24 hr tablet Take 1 tablet by mouth Daily.      . insulin regular human CONCENTRATED (HUMULIN R) 500 UNIT/ML SOLN injection Inject into the skin.        Marland Kitchen LORazepam (ATIVAN) 0.5 MG tablet Take 0.5 mg by mouth every 8 (eight) hours as needed.       . metolazone (ZAROXOLYN) 2.5 MG tablet Take 2.5 mg by mouth as directed.       Marland Kitchen METOPROLOL SUCCINATE PO Take 100 mg by mouth daily.        . Multiple Vitamin (MULTIVITAMIN PO) Take by mouth daily.        . nitroGLYCERIN (NITROSTAT) 0.4 MG SL tablet Place 0.4  mg under the tongue every 5 (five) minutes as needed.        . Omega-3 Fatty Acids (FISH OIL PO) Take by mouth 2 (two) times daily.        . Potassium Chloride (KCL-20 PO) Take by mouth 2 (two) times daily.        . rosuvastatin (CRESTOR) 20 MG tablet Take 1 tablet (20 mg total) by mouth at bedtime.  30 tablet  5  . Sildenafil Citrate (VIAGRA PO) Take by mouth.        . DISCONTD: aspirin 81 MG tablet Take 325 mg by mouth daily.          No Known Allergies  Past Medical History  Diagnosis Date  . Coronary disease October 2010    Status post stenting of the proximal to mid right coronary with DES.  Status post extensive stenting of the proximal right coronary to the crux.  He has moderate disease in  the left  coronary system  . Hypertension   . Diabetes mellitus     Insulin Dependent  . Retroperitoneal bleed     Hx of right pelvic  . Diastolic dysfunction     Congestive heart failure with  . Chronic renal insufficiency   . Sleep apnea     Obstructive   . Hypoventilation     syndrome  . Obesity   . Pulmonary hypertension     Moderate to severe  . Pulmonary embolism March 2010    hx of and DVT in March of 2010 on chronic Coumadin therapy  . Dyspnea     multifactorial. chronic  . COPD (chronic obstructive pulmonary disease)     with restriction  . Gout   . Depression     Past Surgical History  Procedure Date  . Exploratory laparotomy 1972    POST  . Back surgery 2000    post  . US echocardiography 11-25-2008    EF 55-60%  . Cardiovascular stress test 11-14-2008    EF 44%    History  Smoking status  . Former Smoker  . Quit date: 02/01/1980  Smokeless tobacco  . Not on file    History  Alcohol Use  . Yes    Wine Occasionally    Family History  Problem Relation Age of Onset  . Heart attack Mother   . Coronary artery disease Mother   . Hypertension Mother   . Coronary artery disease Father   . Heart attack Father     Review of Systems: The review of  systems is positive for bilateral hip pain. He had bilateral hip injections without improvement. He did have a course of physical therapy which really didn't help the pain very much but he felt that it did improve his overall condition..  All other systems were reviewed and are negative.  Physical Exam: BP 142/72  Pulse 65  Ht 5\' 10"  (1.778 m)  Wt 275 lb 9.6 oz (125.011 kg)  BMI 39.54 kg/m2 He is an obese white male in no acute distress. He is normocephalic, atraumatic. Pupils are equal round and reactive to light and accommodation. Extraocular movements are full. Neck is supple without JVD, adenopathy, thyromegaly, or bruits. Lungs are clear. Cardiac exam reveals a regular rate and rhythm without gallop or murmur. Abdomen is morbidly obese and nontender. Extremities reveal 1-2+ edema. He has chronic hyperpigmentation. Pedal pulses are diminished on the right. He is alert and oriented x3. Cranial nerves II through XII are intact. LABORATORY DATA: ECG today is normal.  Assessment / Plan:

## 2010-08-20 NOTE — Assessment & Plan Note (Signed)
I requested a copy of his most recent laboratory data from his primary care physician.

## 2010-08-20 NOTE — Patient Instructions (Signed)
We will schedule you for a nuclear stress test.  We will schedule you for carotid doppler study  I will get a copy of your most recent lab work from Dr. Lois Huxley in [place].  I will plan on seeing you in 6 months.

## 2010-08-23 ENCOUNTER — Encounter: Payer: Self-pay | Admitting: Cardiology

## 2010-09-06 ENCOUNTER — Encounter (INDEPENDENT_AMBULATORY_CARE_PROVIDER_SITE_OTHER): Payer: No Typology Code available for payment source | Admitting: *Deleted

## 2010-09-06 ENCOUNTER — Ambulatory Visit (HOSPITAL_COMMUNITY): Payer: No Typology Code available for payment source | Attending: Cardiology | Admitting: Radiology

## 2010-09-06 DIAGNOSIS — I779 Disorder of arteries and arterioles, unspecified: Secondary | ICD-10-CM

## 2010-09-06 DIAGNOSIS — R0789 Other chest pain: Secondary | ICD-10-CM

## 2010-09-06 DIAGNOSIS — R079 Chest pain, unspecified: Secondary | ICD-10-CM | POA: Insufficient documentation

## 2010-09-06 DIAGNOSIS — R0609 Other forms of dyspnea: Secondary | ICD-10-CM

## 2010-09-06 DIAGNOSIS — R0989 Other specified symptoms and signs involving the circulatory and respiratory systems: Secondary | ICD-10-CM

## 2010-09-06 DIAGNOSIS — I6529 Occlusion and stenosis of unspecified carotid artery: Secondary | ICD-10-CM

## 2010-09-06 DIAGNOSIS — I2510 Atherosclerotic heart disease of native coronary artery without angina pectoris: Secondary | ICD-10-CM

## 2010-09-06 MED ORDER — REGADENOSON 0.4 MG/5ML IV SOLN
0.4000 mg | Freq: Once | INTRAVENOUS | Status: AC
  Administered 2010-09-06: 0.4 mg via INTRAVENOUS

## 2010-09-06 MED ORDER — TECHNETIUM TC 99M TETROFOSMIN IV KIT
33.0000 | PACK | Freq: Once | INTRAVENOUS | Status: AC | PRN
  Administered 2010-09-06: 33 via INTRAVENOUS

## 2010-09-06 MED ORDER — TECHNETIUM TC 99M TETROFOSMIN IV KIT
10.8000 | PACK | Freq: Once | INTRAVENOUS | Status: AC | PRN
  Administered 2010-09-06: 11 via INTRAVENOUS

## 2010-09-06 NOTE — Progress Notes (Signed)
[facility] NUCLEAR MED [address] [place] [address] [phone]  Cardiology Nuclear Med Study  KAYMEN ADRIAN is a 70 y.o. male [id number] 07/20/1940   Nuclear Med Background Indication for Stress Test:  Evaluation for Ischemia and Stent Patency History:  COPD and '10 Echo:EF=55-60%; '10 QIO:NGEXBMWU, EF=44%>Stent-RCA; '11 Cath:Patent Stent, Moderate 3-V DZ., Moderate Pulmonary HTN; H/O Diastolic CHF. Cardiac Risk Factors: Carotid Disease, Family History - CAD, History of Smoking, Hypertension, IDDM Type 2, Lipids and Obesity  Symptoms:  Chest Pressure.  (last episode of chest discomfort was about 5-days ago), DOE/SOB and Fatigue    Nuclear Pre-Procedure Caffeine/Decaff Intake:  None NPO After: 8:00am   Lungs:  Clear.  O2 Sat 96% on RA. IV 0.9% NS with Angio Cath:  18g  IV Site: R Antecubital  IV Started by:  Stanton Kidney, EMT-P  Chest Size (in):  48 Cup Size: n/a  Height: 5\' 10"  (1.778 m)  Weight:  268 lb (121.564 kg)  BMI:  Body mass index is 38.45 kg/(m^2). Tech Comments:  CBG= 125 @ 8:00 am, per patient.    Nuclear Med Study 1 or 2 day study: 1 day  Stress Test Type:  Lexiscan  Reading MD: Olga Millers, MD  Order Authorizing Provider:  Peter Swaziland, MD  Resting Radionuclide: Technetium 70m Tetrofosmin  Resting Radionuclide Dose: 10.8 mCi   Stress Radionuclide:  Technetium 58m Tetrofosmin  Stress Radionuclide Dose: 33.0 mCi           Stress Protocol Rest HR: 58 Stress HR: 72  Rest BP: 139/65 Stress BP: 144/59  Exercise Time (min): n/a METS: n/a   Predicted Max HR: 152 bpm % Max HR: 47.37 bpm Rate Pressure Product: 13244   Dose of Adenosine (mg):  n/a Dose of Lexiscan: 0.4 mg  Dose of Atropine (mg): n/a Dose of Dobutamine: n/a mcg/kg/min (at max HR)  Stress Test Technologist: Smiley Houseman, CMA-N  Nuclear Technologist:  Doyne Keel, CNMT     Rest Procedure:  Myocardial perfusion imaging was performed at rest 45 minutes  following the intravenous administration of Technetium 48m Tetrofosmin.  Rest ECG: Sinus bradycardia.  Stress Procedure:  The patient received IV Lexiscan 0.4 mg over 15-seconds.  Technetium 74m Tetrofosmin injected at 30-seconds.  There were no significant changes with Lexiscan. He did c/o chest tightness, 5/10, with infusion.  Quantitative spect images were obtained after a 45 minute delay.  Stress ECG: No significant ST segment change suggestive of ischemia.  QPS Raw Data Images:  Acquisition technically good; mild LVE. Stress Images:  There is decreased uptake in the inferior wall. Rest Images:  There is decreased uptake in the inferior wall. Subtraction (SDS):  No evidence of ischemia. Transient Ischemic Dilatation (Normal <1.22):  1.10 Lung/Heart Ratio (Normal <0.45):  0.38  Quantitative Gated Spect Images QGS EDV:  120 ml QGS ESV:  59 ml QGS cine images:  NL LV Function; NL Wall Motion QGS EF: 51%  Impression Exercise Capacity:  Lexiscan with no exercise. BP Response:  Normal blood pressure response. Clinical Symptoms:  No chest pain. ECG Impression:  No significant ST segment change suggestive of ischemia. Comparison with Prior Nuclear Study: No images to compare  Overall Impression:  Abnormal stress nuclear study with prior inferior infarct; no significant ischemia.   Olga Millers

## 2010-09-07 NOTE — Progress Notes (Signed)
nuc med report routed to Dr. Jordan 09/07/10 Wilson Sample  

## 2010-09-08 NOTE — Progress Notes (Signed)
Notified of nuclear stress test. Will send copy to Dr.James Earlene Plater

## 2010-09-08 NOTE — Progress Notes (Signed)
Stress test looks quite good. Fixed inferobasal defect. No ischemia. EF ok. Paul Chen

## 2010-09-09 ENCOUNTER — Encounter: Payer: Self-pay | Admitting: Cardiology

## 2010-09-10 ENCOUNTER — Telehealth: Payer: Self-pay | Admitting: *Deleted

## 2010-09-10 NOTE — Telephone Encounter (Signed)
Message copied by Lorayne Bender on Fri Sep 10, 2010 10:06 AM ------      Message from: [place], PETER M      Created: Thu Sep 09, 2010 11:06 AM       60-79% right ICA stenosis, 40-59% left ICA disease. Moderate. Continue medical Rx.

## 2010-09-10 NOTE — Telephone Encounter (Signed)
Notified of doppler results. Will send to Dr. Earlene Plater

## 2010-09-13 ENCOUNTER — Other Ambulatory Visit: Payer: Self-pay | Admitting: Cardiology

## 2010-09-14 NOTE — Telephone Encounter (Signed)
Med refill Zaroxolyn by fax

## 2010-10-20 ENCOUNTER — Ambulatory Visit: Payer: Self-pay | Admitting: Pain Medicine

## 2010-11-23 ENCOUNTER — Other Ambulatory Visit: Payer: Self-pay | Admitting: Cardiology

## 2011-01-18 IMAGING — CR DG CHEST 2V
2 series · 2 of 2 positions shown · non-contrast
Comparison: 11/24/2008

CLINICAL DATA: Renal insufficiency, shortness of breath, status
post VQ scan

CHEST - 2 VIEW

[w chest pa]
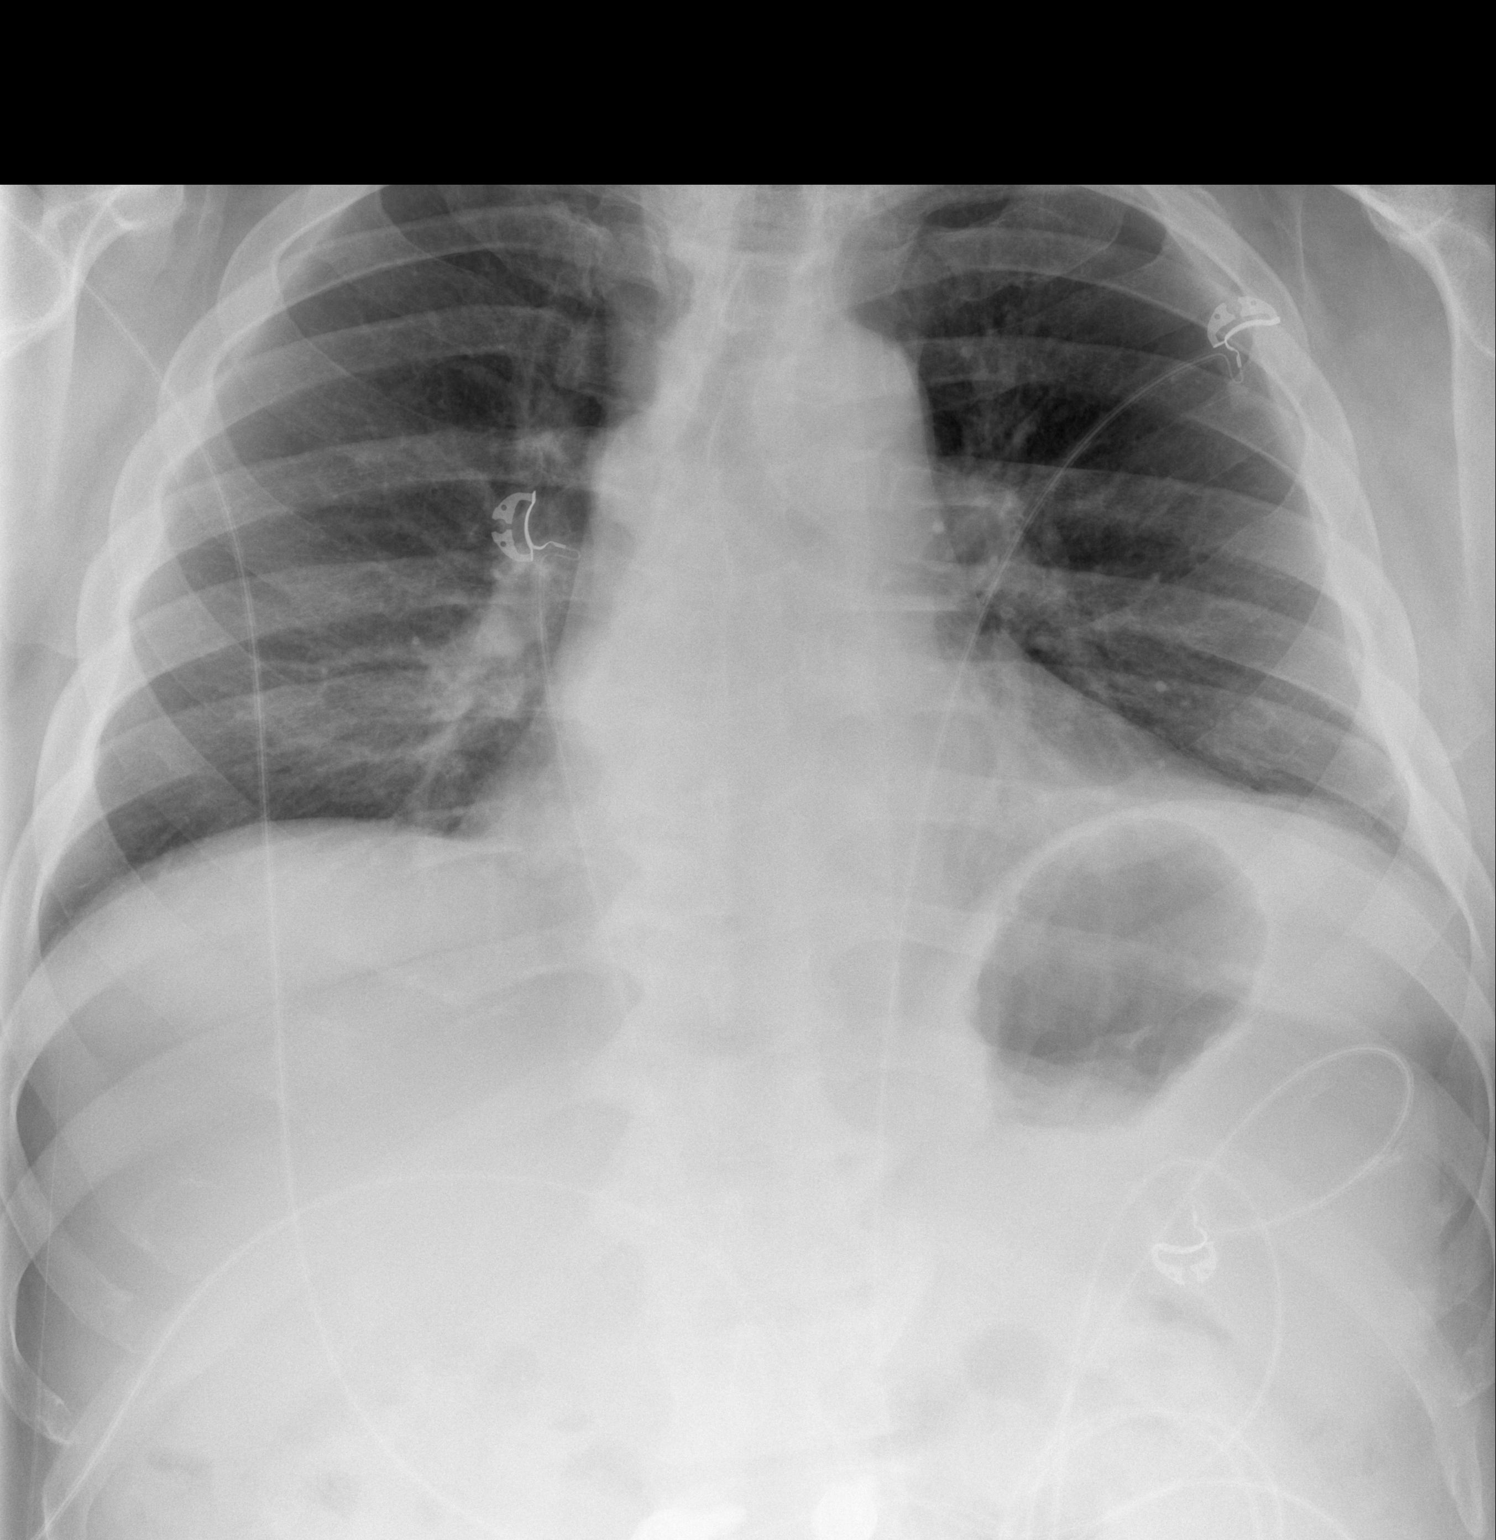

[w chest lat]
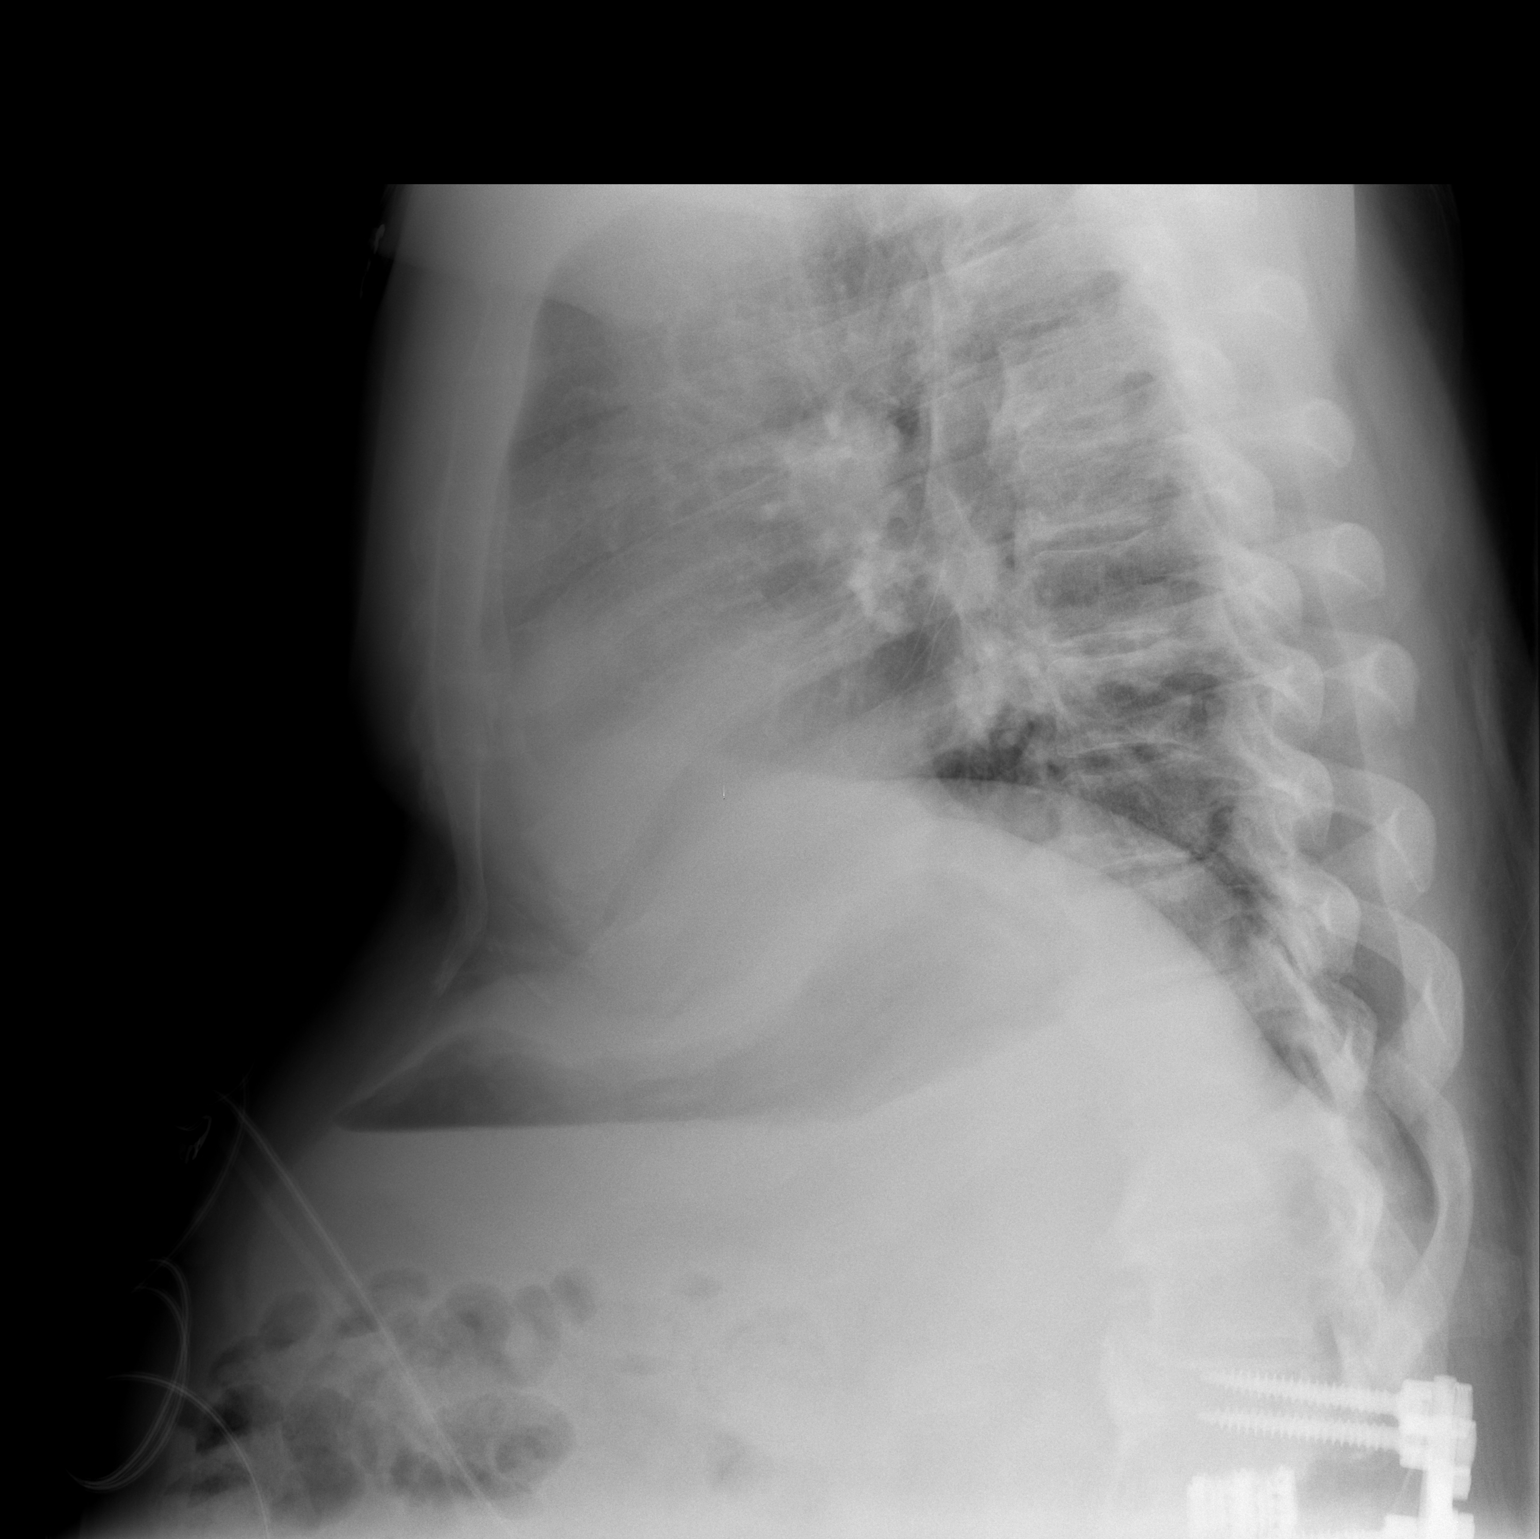

[2 of 2 positions shown; findings below may reference images not displayed]

FINDINGS: Mild cardiac enlargement.
Normal mediastinal contours and pulmonary vascularity.
Minimal bibasilar atelectasis, greater on right.
No pulmonary infiltrate or pleural effusion.
Bones unremarkable.
Cardiac monitoring lines project over chest.
End plate spur formation thoracolumbar spine.
IMPRESSION: Minimal bibasilar atelectasis.

## 2011-01-18 IMAGING — NM NM PULM PERFUSION & VENT (REBREATHING & WASHOUT)
2 series · 12 of 12 positions shown · non-contrast
Comparison: Radiographic examination 11/24/2008

CLINICAL DATA: History of shortness of breath.  History of renal
insufficiency.

NUCLEAR MEDICINE VENTILATION - PERFUSION LUNG SCAN
TECHNIQUE: Wash-in, equilibrium, and wash-out phase ventilation
images were obtained using Ye-H55 gas.  Perfusion images were
obtained in multiple projections after intravenous injection of Tc-
99m MAA.
Radiopharmaceuticals:  10 mCi Ye-H55 gas and 6 mCi Hc-EEm MAA.

[vq scan · 2.52mm/px · 6 of 19 frames shown (1 of 2)]
[frame 2/19  full-range]
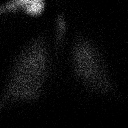
[frame 5/19]
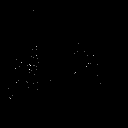
[frame 8/19]
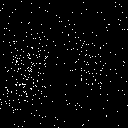
[frame 11/19]
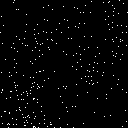
[frame 14/19]
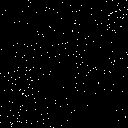
[frame 18/19]
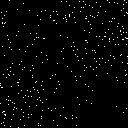

[vq scan · 2.52mm/px · 6 of 19 frames shown (2 of 2)]
[frame 2/19  full-range]
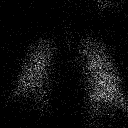
[frame 5/19]
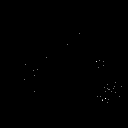
[frame 8/19]
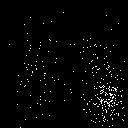
[frame 11/19]
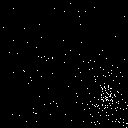
[frame 14/19]
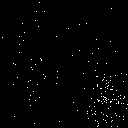
[frame 18/19]
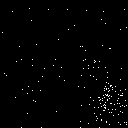

[12 of 12 positions shown; findings below may reference images not displayed]

FINDINGS: Distribution of xenon gas on breath-holding and
equilibrium phases appears normal.  There is no evidence of lobar
or segmental defect.  On the washout phases there is air trapping
bilaterally but more in the right lung than the left consistent
with an element of obstructive pulmonary disease.

There is slight inhomogeneity in the distribution of the MAA
particles but no segmental or lobar defects are seen.  No
ventilation perfusion mismatch is evident.
IMPRESSION: There is evidence of air trapping and obstructive pulmonary disease
on washout phase of ventilation study.There is slight inhomogeneity
in the distribution of the MAA particles on the perfusion study but
no segmental or lobar defects are seen.  No ventilation perfusion
mismatch is evident.

The examination is low probability for pulmonary embolism.

## 2011-03-22 ENCOUNTER — Other Ambulatory Visit: Payer: Self-pay | Admitting: Cardiology

## 2011-04-03 ENCOUNTER — Other Ambulatory Visit: Payer: Self-pay | Admitting: Cardiology

## 2011-04-20 ENCOUNTER — Other Ambulatory Visit: Payer: Self-pay | Admitting: Cardiology

## 2011-05-25 ENCOUNTER — Other Ambulatory Visit: Payer: Self-pay

## 2011-05-25 ENCOUNTER — Encounter: Payer: Self-pay | Admitting: Cardiology

## 2011-05-25 ENCOUNTER — Ambulatory Visit (INDEPENDENT_AMBULATORY_CARE_PROVIDER_SITE_OTHER): Payer: No Typology Code available for payment source | Admitting: Cardiology

## 2011-05-25 VITALS — BP 120/52 | HR 76 | Ht 70.0 in | Wt 272.0 lb

## 2011-05-25 DIAGNOSIS — I2789 Other specified pulmonary heart diseases: Secondary | ICD-10-CM

## 2011-05-25 DIAGNOSIS — I779 Disorder of arteries and arterioles, unspecified: Secondary | ICD-10-CM

## 2011-05-25 DIAGNOSIS — I272 Pulmonary hypertension, unspecified: Secondary | ICD-10-CM

## 2011-05-25 DIAGNOSIS — I10 Essential (primary) hypertension: Secondary | ICD-10-CM

## 2011-05-25 DIAGNOSIS — Z86711 Personal history of pulmonary embolism: Secondary | ICD-10-CM

## 2011-05-25 DIAGNOSIS — I2510 Atherosclerotic heart disease of native coronary artery without angina pectoris: Secondary | ICD-10-CM

## 2011-05-25 NOTE — Progress Notes (Signed)
Paul Chen Date of Birth: 1940-04-23   History of Present Illness: Paul Chen is seen for followup today. He reports that he is doing fairly well today. He was hospitalized 2 weeks ago at [facility] with COPD exacerbation. He has chronic lower extremity edema but notes that after his hospital stay the swelling was gone. It is now back to his baseline. He was given oxygen on an as-needed basis but hasn't really used it. He denies any significant chest pain. He denies any symptoms of TIA or stroke. On his last visit in July of 2012 we scheduled him for carotid Doppler studies which demonstrated moderate stenosis on the right ICA and mild to moderate disease on the left. 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 Ejection fraction was 51%. He is status post extensive stenting of the rock small to mid right coronary in October of 2010 with DES. Repeat cardiac catheterization in February 2011 showed the stent was patent.  Current Outpatient Prescriptions on File Prior to Visit  Medication Sig Dispense Refill  . ALBUTEROL IN Inhale into the lungs as directed.      Marland Kitchen allopurinol (ZYLOPRIM) 100 MG tablet Take 100 mg by mouth daily.        Marland Kitchen amLODipine (NORVASC) 10 MG tablet TAKE ONE TABLET BY MOUTH DAILY  90 tablet  3  . aspirin 325 MG tablet Take 325 mg by mouth daily.        . B-D INS SYR ULTRAFINE .3CC/30G 30G X 1/2" 0.3 ML MISC       . busPIRone (BUSPAR) 30 MG tablet Take 15 mg by mouth 2 (two) times daily.        . clopidogrel (PLAVIX) 75 MG tablet TAKE ONE TABLET BY MOUTH DAILY WITH A MEAL  90 tablet  0  . CRESTOR 20 MG tablet TAKE 1 TABLET BY MOUTH EVERY NIGHT AT BEDTIME  30 tablet  4  . doxazosin (CARDURA) 4 MG tablet Take 4 mg by mouth daily.        . DULoxetine (CYMBALTA) 60 MG capsule Take 60 mg by mouth daily.        Marland Kitchen FLOVENT HFA 44 MCG/ACT inhaler Inhale into the lungs as directed.       . furosemide (LASIX) 40 MG tablet  TAKE 1 TABLET BY MOUTH TWICE DAILY  180 tablet  1  . gabapentin (NEURONTIN) 300 MG capsule Take 300 mg by mouth 3 (three) times daily.        Marland Kitchen glipiZIDE (GLUCOTROL XL) 2.5 MG 24 hr tablet Take 1 tablet by mouth Daily.      . insulin regular human CONCENTRATED (HUMULIN R) 500 UNIT/ML SOLN injection Inject into the skin.        Marland Kitchen LORazepam (ATIVAN) 0.5 MG tablet Take 0.5 mg by mouth every 8 (eight) hours as needed.       . metolazone (ZAROXOLYN) 2.5 MG tablet TAKE 1 TABLET BY MOUTH EVERY 3 DAILY  30 tablet  5  . METOPROLOL SUCCINATE PO Take 100 mg by mouth daily.        . Multiple Vitamin (MULTIVITAMIN PO) Take by mouth daily.        . nitroGLYCERIN (NITROSTAT) 0.4 MG SL tablet Place 0.4 mg under the tongue every 5 (five) minutes as needed.        . Omega-3 Fatty Acids (FISH OIL PO) Take by mouth 2 (two) times daily.        Marland Kitchen  oxyCODONE-acetaminophen (PERCOCET) 7.5-500 MG per tablet Take 1 tablet by mouth as directed. 15 mg      . Potassium Chloride (KCL-20 PO) Take by mouth 2 (two) times daily.        . Sildenafil Citrate (VIAGRA PO) Take by mouth.        . warfarin (COUMADIN) 3 MG tablet Take 3 mg by mouth as directed.          No Known Allergies  Past Medical History  Diagnosis Date  . Coronary disease October 2010    Status post stenting of the proximal to mid right coronary with DES.  Status post extensive stenting of the proximal right coronary to the crux.  He has moderate disease in  the left coronary system  . Hypertension   . Diabetes mellitus     Insulin Dependent  . Retroperitoneal bleed     Hx of right pelvic  . Diastolic dysfunction     Congestive heart failure with  . Chronic renal insufficiency   . Sleep apnea     Obstructive   . Hypoventilation     syndrome  . Obesity   . Pulmonary hypertension     Moderate to severe  . Pulmonary embolism March 2010    hx of and DVT in March of 2010 on chronic Coumadin therapy  . Dyspnea     multifactorial. chronic  . COPD  (chronic obstructive pulmonary disease)     with restriction  . Gout   . Depression     Past Surgical History  Procedure Date  . Exploratory laparotomy 1972    POST  . Back surgery 2000    post  . US echocardiography 11-25-2008    EF 55-60%  . Cardiovascular stress test 11-14-2008    EF 44%    History  Smoking status  . Former Smoker  . Quit date: 02/01/1980  Smokeless tobacco  . Not on file    History  Alcohol Use  . Yes    Wine Occasionally    Family History  Problem Relation Age of Onset  . Heart attack Mother   . Coronary artery disease Mother   . Hypertension Mother   . Coronary artery disease Father   . Heart attack Father     Review of Systems: The review of systems is positive for recent COPD exacerbation. He states his breathing is back to normal now and he has no cough. All other systems were reviewed and are negative.  Physical Exam: BP 120/52  Pulse 76  Ht 5\' 10"  (1.778 m)  Wt 272 lb (123.378 kg)  BMI 39.03 kg/m2 He is an obese white male in no acute distress. He is normocephalic, atraumatic. Pupils are equal round and reactive to light and accommodation. Extraocular movements are full. Neck is supple without JVD, adenopathy, thyromegaly, or bruits. Lungs are clear. Cardiac exam reveals a regular rate and rhythm without gallop or murmur. Abdomen is morbidly obese and nontender. Extremities reveal 1-2+ edema. He has chronic hyperpigmentation. He has extensive excoriations on his arms and legs. Pedal pulses are diminished on the right. He is alert and oriented x3. Cranial nerves II through XII are intact. LABORATORY DATA:   Assessment / Plan:

## 2011-05-25 NOTE — Assessment & Plan Note (Addendum)
He remains asymptomatic from a cardiac standpoint. He did have extensive stenting of the right coronary in 2010 with DES. He requires chronic Coumadin therapy for history of DVT and pulmonary emboli. I recommended this point that we discontinue his aspirin. We will have no long-term data concerning dual antiplatelet therapy at this point. I would prefer Plavix at this point given his peripheral arterial disease and because this would have less risk of causing gastric irritation. I've also recommended that he stop taking Celebrex for his arthritis given the risk of bleeding. I will plan on followup again in 6 months. I have requested a copy of his recent hospital records.

## 2011-05-25 NOTE — Assessment & Plan Note (Signed)
He has moderate carotid arterial disease. He remains asymptomatic. We will recheck carotid Dopplers on his next followup in 6 months.

## 2011-05-25 NOTE — Assessment & Plan Note (Signed)
Blood pressure is well controlled on his current medication. 

## 2011-05-25 NOTE — Patient Instructions (Signed)
Stop taking ASA.  I would prefer that you not take Celebrex.   Continue your other medication.  I will request a copy of your recent hospital records.  I will see you again in 6 months with carotid dopplers.

## 2011-05-25 NOTE — Assessment & Plan Note (Signed)
He has chronic pulmonary hypertension that is multifactorial. May be related to his history of pulmonary embolic disease as well as significant restrictive lung disease.

## 2011-06-19 ENCOUNTER — Other Ambulatory Visit: Payer: Self-pay | Admitting: Cardiology

## 2011-07-01 ENCOUNTER — Other Ambulatory Visit: Payer: Self-pay | Admitting: Cardiology

## 2011-07-01 MED ORDER — CLOPIDOGREL BISULFATE 75 MG PO TABS: 75.0000 mg | ORAL_TABLET | Freq: Every day | ORAL | Status: DC

## 2011-07-22 ENCOUNTER — Other Ambulatory Visit: Payer: Self-pay | Admitting: Gastroenterology

## 2011-08-30 ENCOUNTER — Other Ambulatory Visit: Payer: Self-pay | Admitting: Cardiology

## 2011-09-26 ENCOUNTER — Other Ambulatory Visit: Payer: Self-pay | Admitting: Cardiology

## 2011-11-30 ENCOUNTER — Other Ambulatory Visit: Payer: Self-pay | Admitting: Cardiology

## 2012-03-20 ENCOUNTER — Other Ambulatory Visit: Payer: Self-pay | Admitting: Cardiology

## 2012-04-17 ENCOUNTER — Telehealth: Payer: Self-pay

## 2012-04-17 MED ORDER — ATORVASTATIN CALCIUM 40 MG PO TABS: 40.0000 mg | ORAL_TABLET | Freq: Every day | ORAL | Status: DC

## 2012-04-17 NOTE — Telephone Encounter (Signed)
Spoke to patient was told Dr.Jordan received a letter from [employer] stating crestor is no longer covered.Dr.Jordan prescribed lipitor 40 mg daily and stop crestor.Prescription sent to pharmacy

## 2012-07-12 ENCOUNTER — Other Ambulatory Visit: Payer: Self-pay

## 2012-07-12 MED ORDER — CLOPIDOGREL BISULFATE 75 MG PO TABS: 75.0000 mg | ORAL_TABLET | Freq: Every day | ORAL | Status: DC

## 2012-07-12 NOTE — Telephone Encounter (Signed)
clopidogrel (PLAVIX) 75 MG tablet  TAKE ONE TABLET BY MOUTH DAILY WITH A MEAL   90 tablet   Peter M [place], MD at 05/25/2011 12:47 PM Patient Instructions    Stop taking ASA.  I would prefer that you not take Celebrex.  Continue your other medication.  I will request a copy of your recent hospital records.  I will see you again in 6 months with carotid dopplers.

## 2012-08-07 ENCOUNTER — Other Ambulatory Visit: Payer: Self-pay | Admitting: Cardiology

## 2012-10-19 ENCOUNTER — Encounter: Payer: Self-pay | Admitting: Cardiology

## 2012-10-22 ENCOUNTER — Encounter: Payer: Self-pay | Admitting: Cardiology

## 2013-01-07 ENCOUNTER — Encounter (INDEPENDENT_AMBULATORY_CARE_PROVIDER_SITE_OTHER): Payer: Self-pay

## 2013-01-07 ENCOUNTER — Encounter: Payer: Self-pay | Admitting: Cardiology

## 2013-01-07 ENCOUNTER — Telehealth: Payer: Self-pay

## 2013-01-07 ENCOUNTER — Ambulatory Visit (INDEPENDENT_AMBULATORY_CARE_PROVIDER_SITE_OTHER): Payer: MEDICARE | Admitting: Cardiology

## 2013-01-07 VITALS — BP 142/82 | HR 68 | Ht 70.0 in | Wt 271.0 lb

## 2013-01-07 DIAGNOSIS — I2510 Atherosclerotic heart disease of native coronary artery without angina pectoris: Secondary | ICD-10-CM

## 2013-01-07 DIAGNOSIS — I10 Essential (primary) hypertension: Secondary | ICD-10-CM

## 2013-01-07 DIAGNOSIS — E669 Obesity, unspecified: Secondary | ICD-10-CM

## 2013-01-07 DIAGNOSIS — E785 Hyperlipidemia, unspecified: Secondary | ICD-10-CM

## 2013-01-07 DIAGNOSIS — I779 Disorder of arteries and arterioles, unspecified: Secondary | ICD-10-CM

## 2013-01-07 MED ORDER — ATORVASTATIN CALCIUM 40 MG PO TABS: 40.0000 mg | ORAL_TABLET | Freq: Every day | ORAL | Status: AC

## 2013-01-07 MED ORDER — CLOPIDOGREL BISULFATE 75 MG PO TABS: 75.0000 mg | ORAL_TABLET | Freq: Every day | ORAL | Status: DC

## 2013-01-07 NOTE — Progress Notes (Signed)
Paul Chen Date of Birth: 03-27-1940   History of Present Illness: Paul Chen is seen for followup today. He was last seen in April 2013. He has a history of CAD and is s/p stenting of the proximal to mid RCA in October 2010 with 3 sequential Xience DES. Repeat cardiac cath in 2011 showed patent stents. He is on chronic Plavix therapy and Coumadin (for history of PE). Not on ASA. He also has a history of moderate carotid disease by dopplers in 2012. He denies any current chest pain, TIA or CVA symptoms. He has chronic dyspnea that is unchanges. He was hospitalized in June 2014 with ARF with creatinine up to 4.8. There was some concern he had a tick borne illness. His diuretics and ACEi were held. Creatinine came down to 1.9. He is back on low dose lisinopril. Last A1c was 7.8. He does have chronic back pain and is being considered for a spinal cord stimulator. He would need to be off Plavix for this.  Current Outpatient Prescriptions on File Prior to Visit  Medication Sig Dispense Refill  . allopurinol (ZYLOPRIM) 100 MG tablet Take 100 mg by mouth daily.        Marland Kitchen amLODipine (NORVASC) 10 MG tablet TAKE ONE TABLET BY MOUTH DAILY  90 tablet  3  . B-D INS SYR ULTRAFINE .3CC/30G 30G X 1/2" 0.3 ML MISC       . doxazosin (CARDURA) 4 MG tablet Take 4 mg by mouth daily.        . DULoxetine (CYMBALTA) 60 MG capsule Take 60 mg by mouth daily.        Marland Kitchen FLOVENT HFA 44 MCG/ACT inhaler Inhale into the lungs as directed.       . gabapentin (NEURONTIN) 300 MG capsule Take 300 mg by mouth 3 (three) times daily.        . insulin regular human CONCENTRATED (HUMULIN R) 500 UNIT/ML SOLN injection Inject into the skin as directed.       Marland Kitchen LORazepam (ATIVAN) 0.5 MG tablet Take 0.5 mg by mouth every 8 (eight) hours as needed.       . Multiple Vitamin (MULTIVITAMIN PO) Take by mouth daily.        . nitroGLYCERIN (NITROSTAT) 0.4 MG SL tablet Place 0.4 mg under the tongue every 5 (five) minutes as needed.        . Omega-3  Fatty Acids (FISH OIL PO) Take by mouth 2 (two) times daily.        . Sildenafil Citrate (VIAGRA PO) Take by mouth.        . Tamsulosin HCl (FLOMAX) 0.4 MG CAPS Take 0.4 mg by mouth daily.      Marland Kitchen warfarin (COUMADIN) 3 MG tablet Take 3 mg by mouth as directed.         No current facility-administered medications on file prior to visit.    No Known Allergies  Past Medical History  Diagnosis Date  . Coronary disease October 2010    Status post stenting of the proximal to mid right coronary with DES.  Status post extensive stenting of the proximal right coronary to the crux.  He has moderate disease in  the left coronary system  . Hypertension   . Diabetes mellitus     Insulin Dependent  . Retroperitoneal bleed     Hx of right pelvic  . Diastolic dysfunction     Congestive heart failure with  . Chronic renal insufficiency   . Sleep apnea  Obstructive   . Hypoventilation     syndrome  . Obesity   . Pulmonary hypertension     Moderate to severe  . Pulmonary embolism March 2010    hx of and DVT in March of 2010 on chronic Coumadin therapy  . Dyspnea     multifactorial. chronic  . COPD (chronic obstructive pulmonary disease)     with restriction  . Gout   . Depression   . CKD (chronic kidney disease), stage III     Past Surgical History  Procedure Laterality Date  . Exploratory laparotomy  1972    POST  . Back surgery  2000    post  . US echocardiography  11-25-2008    EF 55-60%  . Cardiovascular stress test  11-14-2008    EF 44%    History  Smoking status  . Former Smoker  . Quit date: 02/01/1980  Smokeless tobacco  . Not on file    History  Alcohol Use  . Yes    Comment: Wine Occasionally    Family History  Problem Relation Age of Onset  . Heart attack Mother   . Coronary artery disease Mother   . Hypertension Mother   . Coronary artery disease Father   . Heart attack Father     Review of Systems: As noted in HPI.  All other systems were  reviewed and are negative.  Physical Exam: BP 142/82  Pulse 68  Ht 5\' 10"  (1.778 m)  Wt 271 lb (122.925 kg)  BMI 38.88 kg/m2 He is an obese white male in no acute distress. HEENT: normocephalic, atraumatic. Pupils are equal round and reactive to light and accommodation. Extraocular movements are full. Neck is supple without JVD, adenopathy, thyromegaly, or bruits. Lungs are clear. Cardiac exam reveals a regular rate and rhythm without gallop or murmur. Abdomen is morbidly obese and nontender. Extremities reveal 1+ edema. He has chronic hyperpigmentation. Pedal pulses are diminished on the right. He is alert and oriented x3. Cranial nerves II through XII are intact.  LABORATORY DATA: Ecg today demonstrates NSR with a normal Ecg. Rate 68 bpm.   Assessment / Plan: 1. CAD s/p extensive stenting of proximal to mid RCA in 2010. He had moderate LCx and 50% left main disease at that time. Doing well on medical therapy. I think it is reasonable to hold Plavix for 14 days in order for him to have a spinal cord stimulator. He coumadin will be bridged with Lovenox. His Plavix should be resumed as soon as possible. Given that he is 4 years out from his stent I think the risk of stent thrombosis is small but not zero. I explained this to the patient and his wife.  2. HTN- BP is acceptable.  3. Pulmonary HTN-multifactorial  4. CKD  5. Carotid arterial disease. Will update his doppler studies.   6. History of pulmonary emboli-on chronic coumadin.  7. Restrictive lung disease  8. Chronic back pain.

## 2013-01-07 NOTE — Telephone Encounter (Signed)
01/07/13 Dr.Jordan's office note faxed to Dr.Brooke Chidgey at fax # [phone].

## 2013-01-07 NOTE — Patient Instructions (Signed)
We will schedule you for carotid doppler studies.  We will [facility] your coming off plavix for a spinal stimulator.  I will see you in 6 months.

## 2013-01-08 ENCOUNTER — Ambulatory Visit (HOSPITAL_COMMUNITY): Payer: MEDICARE | Attending: Cardiology

## 2013-01-08 DIAGNOSIS — E785 Hyperlipidemia, unspecified: Secondary | ICD-10-CM | POA: Insufficient documentation

## 2013-01-08 DIAGNOSIS — E669 Obesity, unspecified: Secondary | ICD-10-CM

## 2013-01-08 DIAGNOSIS — I10 Essential (primary) hypertension: Secondary | ICD-10-CM | POA: Insufficient documentation

## 2013-01-08 DIAGNOSIS — I6529 Occlusion and stenosis of unspecified carotid artery: Secondary | ICD-10-CM

## 2013-01-08 DIAGNOSIS — I2510 Atherosclerotic heart disease of native coronary artery without angina pectoris: Secondary | ICD-10-CM | POA: Insufficient documentation

## 2013-01-08 DIAGNOSIS — I779 Disorder of arteries and arterioles, unspecified: Secondary | ICD-10-CM

## 2013-11-15 ENCOUNTER — Other Ambulatory Visit: Payer: Self-pay

## 2013-11-25 ENCOUNTER — Other Ambulatory Visit: Payer: Self-pay | Admitting: Cardiology

## 2014-07-28 ENCOUNTER — Other Ambulatory Visit: Payer: Self-pay

## 2014-08-29 ENCOUNTER — Other Ambulatory Visit: Payer: Self-pay | Admitting: Cardiology

## 2014-08-29 NOTE — Telephone Encounter (Signed)
REFILL 

## 2014-12-03 ENCOUNTER — Other Ambulatory Visit: Payer: Self-pay | Admitting: Cardiology

## 2014-12-04 NOTE — Telephone Encounter (Signed)
Spoke to patient.Appointment scheduled with Dr.Jordan 03/16/15 at 11:15 am.Plavix refill sent to pharmacy.

## 2014-12-04 NOTE — Telephone Encounter (Signed)
Pt has not been seen in our office since 12/2012 and pt is requesting refills. Please advise

## 2015-03-03 ENCOUNTER — Other Ambulatory Visit: Payer: Self-pay | Admitting: Cardiology

## 2015-03-03 NOTE — Telephone Encounter (Signed)
Rx request sent to pharmacy.  

## 2015-03-16 ENCOUNTER — Encounter: Payer: Self-pay | Admitting: Cardiology

## 2015-03-16 ENCOUNTER — Ambulatory Visit (INDEPENDENT_AMBULATORY_CARE_PROVIDER_SITE_OTHER): Payer: PRIVATE HEALTH INSURANCE | Admitting: Cardiology

## 2015-03-16 VITALS — BP 126/58 | HR 62 | Ht 70.0 in | Wt 265.3 lb

## 2015-03-16 DIAGNOSIS — I10 Essential (primary) hypertension: Secondary | ICD-10-CM

## 2015-03-16 DIAGNOSIS — I2510 Atherosclerotic heart disease of native coronary artery without angina pectoris: Secondary | ICD-10-CM

## 2015-03-16 DIAGNOSIS — I779 Disorder of arteries and arterioles, unspecified: Secondary | ICD-10-CM

## 2015-03-16 DIAGNOSIS — I739 Peripheral vascular disease, unspecified: Secondary | ICD-10-CM

## 2015-03-16 DIAGNOSIS — E785 Hyperlipidemia, unspecified: Secondary | ICD-10-CM

## 2015-03-16 NOTE — Patient Instructions (Signed)
Continue your current therapy  We will schedule you for carotid dopplers and a Nuclear stress test.

## 2015-03-16 NOTE — Progress Notes (Signed)
Paul Chen Date of Birth: 1940/07/05   History of Present Illness: Paul Chen is seen for followup today. He was last seen in Dec 2014.  He has a history of CAD and is s/p stenting of the proximal to mid RCA in October 2010 with 3 sequential Xience DES. Repeat cardiac cath in 2011 showed patent stents. Last Myoview in August 2012 showed an inferior infarct without ischemia and EF 51%.  He is on chronic Plavix therapy and Coumadin (for history of PE). He also has a history of moderate carotid disease by dopplers with last follow up in 2014.  On follow up today he denies  chest pain, TIA or CVA symptoms. He has chronic dyspnea that is unchanged. He has CKD and reports this is stable. He is on insulin for DM and reports occ. Hypoglycemic episodes. He has chronic swelling in his legs. He is very sedentary and states that activity makes him feel worn out.   Current Outpatient Prescriptions on File Prior to Visit  Medication Sig Dispense Refill  . allopurinol (ZYLOPRIM) 100 MG tablet Take 100 mg by mouth daily.      Marland Kitchen amLODipine (NORVASC) 10 MG tablet TAKE ONE TABLET BY MOUTH DAILY 90 tablet 3  . atorvastatin (LIPITOR) 40 MG tablet Take 1 tablet (40 mg total) by mouth daily. 90 tablet 3  . B-D INS SYR ULTRAFINE .3CC/30G 30G X 1/2" 0.3 ML MISC     . B-D UF III MINI PEN NEEDLES 31G X 5 MM MISC as directed.    . busPIRone (BUSPAR) 15 MG tablet Take 1 tablet by mouth 2 (two) times daily.    . clopidogrel (PLAVIX) 75 MG tablet Take 1 tablet (75 mg total) by mouth every morning. MUST KEEP APPOINTMENT FOR REFILLS. 90 tablet 0  . cyclobenzaprine (FLEXERIL) 5 MG tablet Take 0.5 tablets by mouth 3 (three) times daily as needed.    . desonide (DESOWEN) 0.05 % lotion as directed.    . doxazosin (CARDURA) 4 MG tablet Take 4 mg by mouth daily.      . DULoxetine (CYMBALTA) 60 MG capsule Take 60 mg by mouth daily.      Marland Kitchen FLOVENT HFA 44 MCG/ACT inhaler Inhale into the lungs as directed.     . gabapentin (NEURONTIN)  300 MG capsule Take 300 mg by mouth 3 (three) times daily.      Marland Kitchen glucagon 1 MG injection Inject 1 mg into the vein as directed.    . insulin aspart (NOVOLOG FLEXPEN) 100 UNIT/ML SOPN FlexPen Inject 1-3 Units into the skin as directed.    Marland Kitchen ipratropium (ATROVENT) 0.02 % nebulizer solution Take 0.5 mg by nebulization 4 (four) times daily.    Marland Kitchen lisinopril (PRINIVIL,ZESTRIL) 5 MG tablet Take 2.5 mg by mouth daily.    Marland Kitchen LORazepam (ATIVAN) 0.5 MG tablet Take 0.5 mg by mouth every 8 (eight) hours as needed.     . magnesium oxide (MAG-OX) 400 MG tablet Take 400 mg by mouth 2 (two) times daily.    . metoprolol succinate (TOPROL-XL) 100 MG 24 hr tablet Take 1.5 tablets by mouth daily.    Marland Kitchen morphine (AVINZA) 120 MG 24 hr capsule Take 120 mg by mouth daily. As directed    . Multiple Vitamin (MULTIVITAMIN PO) Take by mouth daily.      Marland Kitchen NAFTIN 2 % CREA as directed.    . nitroGLYCERIN (NITROSTAT) 0.4 MG SL tablet Place 0.4 mg under the tongue every 5 (five) minutes as needed.      Marland Kitchen  Omega-3 Fatty Acids (FISH OIL PO) Take by mouth 2 (two) times daily.      Marland Kitchen oxyCODONE (ROXICODONE) 15 MG immediate release tablet 6 (six) times daily.     . Sildenafil Citrate (VIAGRA PO) Take by mouth.      . Tamsulosin HCl (FLOMAX) 0.4 MG CAPS Take 0.4 mg by mouth daily.    Marland Kitchen VICTOZA 18 MG/3ML SOPN 0.2 mLs daily.    Marland Kitchen warfarin (COUMADIN) 3 MG tablet Take 3 mg by mouth as directed.      Marland Kitchen ZOSTAVAX 16109 UNT/0.65ML injection      No current facility-administered medications on file prior to visit.    No Known Allergies  Past Medical History  Diagnosis Date  . Coronary disease October 2010    Status post stenting of the proximal to mid right coronary with DES.  Status post extensive stenting of the proximal right coronary to the crux.  He has moderate disease in  the left coronary system  . Hypertension   . Diabetes mellitus     Insulin Dependent  . Retroperitoneal bleed     Hx of right pelvic  . Diastolic dysfunction      Congestive heart failure with  . Chronic renal insufficiency   . Sleep apnea     Obstructive   . Hypoventilation     syndrome  . Obesity   . Pulmonary hypertension (HCC)     Moderate to severe  . Pulmonary embolism [facility]) March 2010    hx of and DVT in March of 2010 on chronic Coumadin therapy  . Dyspnea     multifactorial. chronic  . COPD (chronic obstructive pulmonary disease) (HCC)     with restriction  . Gout   . Depression   . CKD (chronic kidney disease), stage III     Past Surgical History  Procedure Laterality Date  . Exploratory laparotomy  1972    POST  . Back surgery  2000    post  . US echocardiography  11-25-2008    EF 55-60%  . Cardiovascular stress test  11-14-2008    EF 44%    History  Smoking status  . Former Smoker  . Quit date: 02/01/1980  Smokeless tobacco  . Not on file    History  Alcohol Use  . Yes    Comment: Wine Occasionally    Family History  Problem Relation Age of Onset  . Heart attack Mother   . Coronary artery disease Mother   . Hypertension Mother   . Coronary artery disease Father   . Heart attack Father     Review of Systems: As noted in HPI.  All other systems were reviewed and are negative.  Physical Exam: BP 126/58 mmHg  Pulse 62  Ht 5\' 10"  (1.778 m)  Wt 120.345 kg (265 lb 5 oz)  BMI 38.07 kg/m2 He is an obese white male in no acute distress. HEENT: normocephalic, atraumatic. Pupils are equal round and reactive to light and accommodation. Extraocular movements are full. Neck is supple without JVD, adenopathy, thyromegaly, or bruits. Lungs are clear. Cardiac exam reveals a regular rate and rhythm without gallop or murmur. Normal S1-2.  Abdomen is morbidly obese and nontender. Extremities reveal 1+ edema. He has chronic hyperpigmentation. Skin is dry. Pedal pulses are diminished on the right. He is alert and oriented x3. Cranial nerves II through XII are intact.  LABORATORY DATA: Ecg today demonstrates NSR with  a normal Ecg. Rate 63 bpm. I have personally  reviewed and interpreted this study.    Assessment / Plan: 1. CAD s/p extensive stenting of proximal to mid RCA in 2010. He had moderate LCx and 50% left main disease at that time. Last cardiac cath in 2011 and Myoview in 2012. Doing OK on medical therapy. Difficult to tell if he is having any angina since he is so sedentary. Will update Myoview now.   2. HTN- BP is well controlled.   3. Pulmonary HTN-multifactorial  4. CKD- stage 3.   5. Carotid arterial disease- moderate bilateral. Will update his doppler studies.   6. History of pulmonary emboli-on chronic coumadin.  7. Restrictive lung disease  8. Chronic back pain.

## 2015-03-27 ENCOUNTER — Telehealth (HOSPITAL_COMMUNITY): Payer: Self-pay

## 2015-03-27 NOTE — Telephone Encounter (Signed)
Encounter complete. 

## 2015-04-01 ENCOUNTER — Telehealth (HOSPITAL_COMMUNITY): Payer: Self-pay

## 2015-04-01 ENCOUNTER — Inpatient Hospital Stay (HOSPITAL_COMMUNITY): Admission: RE | Admit: 2015-04-01 | Payer: PRIVATE HEALTH INSURANCE | Source: Ambulatory Visit

## 2015-04-01 ENCOUNTER — Ambulatory Visit (HOSPITAL_COMMUNITY): Admission: RE | Admit: 2015-04-01 | Payer: PRIVATE HEALTH INSURANCE | Source: Ambulatory Visit

## 2015-04-01 NOTE — Telephone Encounter (Signed)
Encounter complete. 

## 2015-04-10 ENCOUNTER — Ambulatory Visit (HOSPITAL_COMMUNITY)
Admission: RE | Admit: 2015-04-10 | Discharge: 2015-04-10 | Disposition: A | Discharge status: Home / Self Care | Payer: Medicare (Managed Care) | Source: Ambulatory Visit | Attending: Cardiovascular Disease | Admitting: Cardiovascular Disease

## 2015-04-10 DIAGNOSIS — E1122 Type 2 diabetes mellitus with diabetic chronic kidney disease: Secondary | ICD-10-CM | POA: Diagnosis not present

## 2015-04-10 DIAGNOSIS — I2510 Atherosclerotic heart disease of native coronary artery without angina pectoris: Secondary | ICD-10-CM

## 2015-04-10 DIAGNOSIS — I779 Disorder of arteries and arterioles, unspecified: Secondary | ICD-10-CM

## 2015-04-10 DIAGNOSIS — I10 Essential (primary) hypertension: Secondary | ICD-10-CM

## 2015-04-10 DIAGNOSIS — I6523 Occlusion and stenosis of bilateral carotid arteries: Secondary | ICD-10-CM | POA: Insufficient documentation

## 2015-04-10 DIAGNOSIS — I739 Peripheral vascular disease, unspecified: Secondary | ICD-10-CM

## 2015-04-10 DIAGNOSIS — E785 Hyperlipidemia, unspecified: Secondary | ICD-10-CM | POA: Insufficient documentation

## 2015-04-10 DIAGNOSIS — N183 Chronic kidney disease, stage 3 (moderate): Secondary | ICD-10-CM | POA: Diagnosis not present

## 2015-04-10 DIAGNOSIS — I129 Hypertensive chronic kidney disease with stage 1 through stage 4 chronic kidney disease, or unspecified chronic kidney disease: Secondary | ICD-10-CM | POA: Diagnosis not present

## 2015-04-21 ENCOUNTER — Other Ambulatory Visit: Payer: Self-pay

## 2015-04-21 DIAGNOSIS — I779 Disorder of arteries and arterioles, unspecified: Secondary | ICD-10-CM

## 2015-04-21 DIAGNOSIS — I739 Peripheral vascular disease, unspecified: Principal | ICD-10-CM

## 2015-05-26 ENCOUNTER — Other Ambulatory Visit: Payer: Self-pay | Admitting: Cardiology

## 2015-05-26 NOTE — Telephone Encounter (Signed)
REFILL 

## 2015-08-18 ENCOUNTER — Other Ambulatory Visit: Payer: Self-pay | Admitting: Cardiology

## 2015-11-16 ENCOUNTER — Other Ambulatory Visit: Payer: Self-pay | Admitting: Cardiology

## 2015-11-16 NOTE — Telephone Encounter (Signed)
REFILL 

## 2016-04-14 ENCOUNTER — Other Ambulatory Visit: Payer: Self-pay | Admitting: Cardiology

## 2016-05-04 ENCOUNTER — Ambulatory Visit (HOSPITAL_COMMUNITY)
Admission: RE | Admit: 2016-05-04 | Discharge: 2016-05-04 | Disposition: A | Discharge status: Home / Self Care | Payer: Medicare HMO | Source: Ambulatory Visit | Attending: Cardiology | Admitting: Cardiology

## 2016-05-04 DIAGNOSIS — I779 Disorder of arteries and arterioles, unspecified: Secondary | ICD-10-CM | POA: Insufficient documentation

## 2016-05-04 DIAGNOSIS — I6523 Occlusion and stenosis of bilateral carotid arteries: Secondary | ICD-10-CM | POA: Diagnosis not present

## 2016-05-04 DIAGNOSIS — I739 Peripheral vascular disease, unspecified: Secondary | ICD-10-CM

## 2016-05-05 ENCOUNTER — Other Ambulatory Visit: Payer: Self-pay

## 2016-05-05 ENCOUNTER — Telehealth: Payer: Self-pay | Admitting: Cardiology

## 2016-05-05 DIAGNOSIS — I739 Peripheral vascular disease, unspecified: Principal | ICD-10-CM

## 2016-05-05 DIAGNOSIS — I779 Disorder of arteries and arterioles, unspecified: Secondary | ICD-10-CM

## 2016-05-05 NOTE — Telephone Encounter (Signed)
Called the patient and left my name and number to call me back to schedule his carotid doppler for 2019.

## 2016-05-09 ENCOUNTER — Encounter (HOSPITAL_COMMUNITY): Payer: Medicare HMO

## 2016-05-27 ENCOUNTER — Other Ambulatory Visit: Payer: Self-pay | Admitting: Cardiology

## 2016-05-30 NOTE — Telephone Encounter (Signed)
REFILL 

## 2016-07-08 ENCOUNTER — Other Ambulatory Visit: Payer: Self-pay | Admitting: Cardiology

## 2016-07-13 NOTE — Progress Notes (Signed)
Paul Chen Date of Birth: 1941/01/01   History of Present Illness: Paul Chen is seen for followup today. He was last seen in February 2017.  He has a history of CAD and is s/p stenting of the proximal to mid RCA in October 2010 with 3 sequential Xience DES. Repeat cardiac cath in 2011 showed patent stents. Last Myoview in August 2012 showed an inferior infarct without ischemia and EF 51%.  He is on chronic Plavix therapy and Coumadin (for history of PE). He also has a history of moderate carotid disease by dopplers.  Most recent carotid dopplers in April 2018 show 40-59% RICA disease and < 40% LICA. He has chronic occlusion of the right SFA.  On prior visit we recommended follow up Myoview study but this was not done.   On follow up today he denies chest pain, TIA or CVA symptoms. He has chronic dyspnea that he states has improved since he is using oxygen at night.  He has CKD and reports this is stable. He is on insulin for DM. He has chronic swelling in his legs but states this is doing well. He has lost 8  Lbs. He is very sedentary. He still has a lot of back and hip pain.  Current Outpatient Prescriptions on File Prior to Visit  Medication Sig Dispense Refill  . allopurinol (ZYLOPRIM) 100 MG tablet Take 100 mg by mouth daily.      Marland Kitchen allopurinol (ZYLOPRIM) 300 MG tablet Take 300 mg by mouth daily. Take 1 tab by mouth daily  11  . amLODipine (NORVASC) 10 MG tablet TAKE ONE TABLET BY MOUTH DAILY 90 tablet 3  . atorvastatin (LIPITOR) 40 MG tablet Take 1 tablet (40 mg total) by mouth daily. 90 tablet 3  . B-D INS SYR ULTRAFINE .3CC/30G 30G X 1/2" 0.3 ML MISC     . B-D UF III MINI PEN NEEDLES 31G X 5 MM MISC as directed.    . busPIRone (BUSPAR) 15 MG tablet Take 1 tablet by mouth 2 (two) times daily.    . clopidogrel (PLAVIX) 75 MG tablet TAKE ONE TABLET EVERY MORNING 30 tablet 8  . cyclobenzaprine (FLEXERIL) 5 MG tablet Take 0.5 tablets by mouth 3 (three) times daily as needed.    . desonide  (DESOWEN) 0.05 % lotion as directed.    . doxazosin (CARDURA) 4 MG tablet Take 4 mg by mouth daily.      . DULoxetine (CYMBALTA) 60 MG capsule Take 60 mg by mouth daily.      Marland Kitchen FLOVENT HFA 44 MCG/ACT inhaler Inhale into the lungs as directed.     . furosemide (LASIX) 20 MG tablet Take 10 mg by mouth daily. Take half a tab by mouth daily  11  . gabapentin (NEURONTIN) 300 MG capsule Take 300 mg by mouth 3 (three) times daily.      Marland Kitchen glucagon 1 MG injection Inject 1 mg into the vein as directed.    . insulin aspart (NOVOLOG FLEXPEN) 100 UNIT/ML SOPN FlexPen Inject 1-3 Units into the skin as directed.    Marland Kitchen ipratropium (ATROVENT) 0.02 % nebulizer solution Take 0.5 mg by nebulization 4 (four) times daily.    Marland Kitchen LANTUS SOLOSTAR 100 UNIT/ML Solostar Pen Inject 60 Units into the muscle. INJECT 60 UNITS UNDER THE SKIN DAILY. MAY INCREASE UP TO 100 UNITS TOTAL PER DAY AS DIRECTED BY DIABETES CLINIC  11  . lisinopril (PRINIVIL,ZESTRIL) 5 MG tablet Take 2.5 mg by mouth daily.    Marland Kitchen  LORazepam (ATIVAN) 0.5 MG tablet Take 0.5 mg by mouth every 8 (eight) hours as needed.     . magnesium oxide (MAG-OX) 400 MG tablet Take 400 mg by mouth 2 (two) times daily.    . metoprolol succinate (TOPROL-XL) 100 MG 24 hr tablet Take 1.5 tablets by mouth daily.    Marland Kitchen. morphine (AVINZA) 120 MG 24 hr capsule Take 120 mg by mouth daily. As directed    . Multiple Vitamin (MULTIVITAMIN PO) Take by mouth daily.      Marland Kitchen. NAFTIN 2 % CREA as directed.    . nitroGLYCERIN (NITROSTAT) 0.4 MG SL tablet Place 0.4 mg under the tongue every 5 (five) minutes as needed.      Marland Kitchen. oxyCODONE (ROXICODONE) 15 MG immediate release tablet 6 (six) times daily.     . Sildenafil Citrate (VIAGRA PO) Take by mouth.      . Tamsulosin HCl (FLOMAX) 0.4 MG CAPS Take 0.4 mg by mouth daily.    Marland Kitchen. VICTOZA 18 MG/3ML SOPN 0.2 mLs daily.    Marland Kitchen. warfarin (COUMADIN) 2.5 MG tablet Take 1 tablet by mouth daily. Take 1 tab by mouth Tuesdays and Fridays  5  . warfarin (COUMADIN) 3  MG tablet Take 3 mg by mouth as directed.      Marland Kitchen. ZOSTAVAX [id number] UNT/0.65ML injection      No current facility-administered medications on file prior to visit.     No Known Allergies  Past Medical History:  Diagnosis Date  . Chronic renal insufficiency   . CKD (chronic kidney disease), stage III   . COPD (chronic obstructive pulmonary disease) (HCC)    with restriction  . Coronary disease October 2010   Status post stenting of the proximal to mid right coronary with DES.  Status post extensive stenting of the proximal right coronary to the crux.  He has moderate disease in  the left coronary system  . Depression   . Diabetes mellitus    Insulin Dependent  . Diastolic dysfunction    Congestive heart failure with  . Dyspnea    multifactorial. chronic  . Gout   . Hypertension   . Hypoventilation    syndrome  . Obesity   . Pulmonary embolism [facility](HCC) March 2010   hx of and DVT in March of 2010 on chronic Coumadin therapy  . Pulmonary hypertension (HCC)    Moderate to severe  . Retroperitoneal bleed    Hx of right pelvic  . Sleep apnea    Obstructive     Past Surgical History:  Procedure Laterality Date  . BACK SURGERY  2000   post  . CARDIOVASCULAR STRESS TEST  11-14-2008   EF 44%  . EXPLORATORY LAPAROTOMY  1972   POST  . US ECHOCARDIOGRAPHY  11-25-2008   EF 55-60%    History  Smoking Status  . Former Smoker  . Quit date: 02/01/1980  Smokeless Tobacco  . Never Used    History  Alcohol Use  . Yes    Comment: Wine Occasionally    Family History  Problem Relation Age of Onset  . Heart attack Mother   . Coronary artery disease Mother   . Hypertension Mother   . Coronary artery disease Father   . Heart attack Father     Review of Systems: As noted in HPI.  All other systems were reviewed and are negative.  Physical Exam: BP (!) 115/58   Pulse 63   Ht 5\' 10"  (1.778 m)   Wt 257  lb 9.6 oz (116.8 kg)   BMI 36.96 kg/m  He is an obese white male in no  acute distress. HEENT: normocephalic, atraumatic. Pupils are equal round and reactive to light and accommodation. Extraocular movements are full. Neck is supple without JVD, adenopathy, thyromegaly, or bruits. Lungs are clear. Cardiac exam reveals a regular rate and rhythm without gallop or murmur. Normal S1-2.  Abdomen is morbidly obese and nontender. Extremities reveal tr edema. He has chronic hyperpigmentation. Skin is dry. Pedal pulses are diminished on the right. He is alert and oriented x3. Cranial nerves II through XII are intact.  LABORATORY DATA: Ecg today demonstrates NSR with a normal Ecg. Rate 63 bpm. I have personally reviewed and interpreted this study.  Labs dated 06/30/16: INR 3.1 06/07/16: A1c 6.9%. 01/22/16: glucose 150, creatinine 1.36. Other BMET normal. Hgb 14. Cholesterol 151, triglycerides 171, HDL 28, LDL 89.   Assessment / Plan: 1. CAD s/p extensive stenting of proximal to mid RCA in 2010. He had moderate LCx and 50% left main disease at that time. Last cardiac cath in 2011 and Myoview in 2012. Asymptomatic on medical therapy. I have recommended following through with a Lexiscan Myoview since his symptoms may not be a reliable marker with his DM and sedentary lifestyle.    2. HTN- BP is well controlled.   3. Pulmonary HTN-multifactorial. Now on nocturnal oxygen.   4. CKD- stage 3.   5. Carotid arterial disease- as noted above. Follow up in one year.  6. History of pulmonary emboli-on chronic coumadin.  7. Restrictive lung disease  8. Chronic back pain.

## 2016-07-14 ENCOUNTER — Encounter: Payer: Self-pay | Admitting: Cardiology

## 2016-07-14 ENCOUNTER — Ambulatory Visit (INDEPENDENT_AMBULATORY_CARE_PROVIDER_SITE_OTHER): Payer: Medicare HMO | Admitting: Cardiology

## 2016-07-14 VITALS — BP 115/58 | HR 63 | Ht 70.0 in | Wt 257.6 lb

## 2016-07-14 DIAGNOSIS — E7800 Pure hypercholesterolemia, unspecified: Secondary | ICD-10-CM

## 2016-07-14 DIAGNOSIS — I779 Disorder of arteries and arterioles, unspecified: Secondary | ICD-10-CM | POA: Diagnosis not present

## 2016-07-14 DIAGNOSIS — I10 Essential (primary) hypertension: Secondary | ICD-10-CM | POA: Diagnosis not present

## 2016-07-14 DIAGNOSIS — I2510 Atherosclerotic heart disease of native coronary artery without angina pectoris: Secondary | ICD-10-CM | POA: Diagnosis not present

## 2016-07-14 DIAGNOSIS — I739 Peripheral vascular disease, unspecified: Secondary | ICD-10-CM

## 2016-07-14 NOTE — Patient Instructions (Signed)
Continue your current therapy  We will schedule you for a nuclear stress test  If Ok I will see you in one year.

## 2016-07-27 ENCOUNTER — Telehealth (HOSPITAL_COMMUNITY): Payer: Self-pay

## 2016-07-27 NOTE — Telephone Encounter (Signed)
Encounter complete. 

## 2016-07-28 ENCOUNTER — Ambulatory Visit (HOSPITAL_COMMUNITY)
Admission: RE | Admit: 2016-07-28 | Discharge: 2016-07-28 | Disposition: A | Discharge status: Home / Self Care | Payer: Medicare HMO | Source: Ambulatory Visit | Attending: Cardiology | Admitting: Cardiology

## 2016-07-28 DIAGNOSIS — I10 Essential (primary) hypertension: Secondary | ICD-10-CM | POA: Diagnosis not present

## 2016-07-28 DIAGNOSIS — I510 Cardiac septal defect, acquired: Secondary | ICD-10-CM | POA: Insufficient documentation

## 2016-07-28 DIAGNOSIS — I2510 Atherosclerotic heart disease of native coronary artery without angina pectoris: Secondary | ICD-10-CM

## 2016-07-28 DIAGNOSIS — I779 Disorder of arteries and arterioles, unspecified: Secondary | ICD-10-CM

## 2016-07-28 DIAGNOSIS — E7800 Pure hypercholesterolemia, unspecified: Secondary | ICD-10-CM | POA: Diagnosis not present

## 2016-07-28 DIAGNOSIS — I739 Peripheral vascular disease, unspecified: Secondary | ICD-10-CM

## 2016-07-28 LAB — MYOCARDIAL PERFUSION IMAGING
LV dias vol: 125 mL (ref 62–150)
LV sys vol: 60 mL
Peak HR: 78 {beats}/min
Rest HR: 61 {beats}/min
SDS: 4
SRS: 1
SSS: 5
TID: 1.27

## 2016-07-28 MED ORDER — REGADENOSON 0.4 MG/5ML IV SOLN
0.4000 mg | Freq: Once | INTRAVENOUS | Status: AC
  Administered 2016-07-28: 0.4 mg via INTRAVENOUS

## 2016-07-28 MED ORDER — TECHNETIUM TC 99M TETROFOSMIN IV KIT
10.6000 | PACK | Freq: Once | INTRAVENOUS | Status: AC | PRN
  Administered 2016-07-28: 10.6 via INTRAVENOUS
  Filled 2016-07-28: qty 11

## 2016-07-28 MED ORDER — TECHNETIUM TC 99M TETROFOSMIN IV KIT
30.9000 | PACK | Freq: Once | INTRAVENOUS | Status: AC | PRN
  Administered 2016-07-28: 30.9 via INTRAVENOUS
  Filled 2016-07-28: qty 31

## 2017-04-06 ENCOUNTER — Telehealth: Payer: Self-pay

## 2017-04-06 NOTE — Telephone Encounter (Signed)
   [facility] Pre-operative Risk Assessment    Request for surgical clearance:  1. What type of surgery is being performed? Left Hallux Amputation  2. When is this surgery scheduled? Pending  3. What type of clearance is required (medical clearance vs. Pharmacy clearance to hold med vs. Both)? Medical and Pharmacy  4. Are there any medications that need to be held prior to surgery and how long? Coumadin  5. Practice name and name of physician performing surgery? [facility]  6. What is your office phone and fax number? Phone # [phone]   Fax # [phone]  7. Anesthesia type (None, local, MAC, general) ? Karie Soda 04/06/2017, 2:14 PM  _________________________________________________________________   (provider comments below)

## 2017-04-06 NOTE — Telephone Encounter (Signed)
   Primary Cardiologist: Dr SwazilandJordan  Chart reviewed as part of pre-operative protocol coverage. Because of Dirk L Marone's past medical history and time since last visit, he/she will require a follow-up visit in order to better assess preoperative cardiovascular risk.  Pre-op covering staff: - Please schedule appointment and call patient to inform them. - Please contact requesting surgeon's office via preferred method (i.e, phone, fax) to inform them of need for appointment prior to surgery.  Corine ShelterLuke Itzelle Gains, PA-C  04/06/2017, 4:40 PM

## 2017-04-07 NOTE — Telephone Encounter (Signed)
Spoke with patient and appointment with [facility] for cardiac clearance. Patient voiced understanding.

## 2017-04-10 ENCOUNTER — Other Ambulatory Visit: Payer: Self-pay

## 2017-04-10 DIAGNOSIS — L98499 Non-pressure chronic ulcer of skin of other sites with unspecified severity: Secondary | ICD-10-CM

## 2017-04-13 ENCOUNTER — Encounter: Payer: Self-pay | Admitting: Physician Assistant

## 2017-04-13 ENCOUNTER — Ambulatory Visit: Payer: Medicare HMO | Admitting: Physician Assistant

## 2017-04-13 VITALS — BP 138/60 | HR 65 | Ht 70.0 in | Wt 265.8 lb

## 2017-04-13 DIAGNOSIS — I739 Peripheral vascular disease, unspecified: Secondary | ICD-10-CM | POA: Diagnosis not present

## 2017-04-13 DIAGNOSIS — I2510 Atherosclerotic heart disease of native coronary artery without angina pectoris: Secondary | ICD-10-CM

## 2017-04-13 DIAGNOSIS — E119 Type 2 diabetes mellitus without complications: Secondary | ICD-10-CM

## 2017-04-13 DIAGNOSIS — Z01810 Encounter for preprocedural cardiovascular examination: Secondary | ICD-10-CM | POA: Diagnosis not present

## 2017-04-13 DIAGNOSIS — N183 Chronic kidney disease, stage 3 unspecified: Secondary | ICD-10-CM

## 2017-04-13 DIAGNOSIS — I10 Essential (primary) hypertension: Secondary | ICD-10-CM

## 2017-04-13 DIAGNOSIS — J449 Chronic obstructive pulmonary disease, unspecified: Secondary | ICD-10-CM

## 2017-04-13 DIAGNOSIS — Z794 Long term (current) use of insulin: Secondary | ICD-10-CM

## 2017-04-13 DIAGNOSIS — I2782 Chronic pulmonary embolism: Secondary | ICD-10-CM | POA: Diagnosis not present

## 2017-04-13 NOTE — Patient Instructions (Addendum)
CONTACT YOUR [facility]IMARY [facility]ARE [facility] FOR AN APPOINTMENT AND LOVENOX BRIDGING. Appointment needs to be AT LEAST 5 days prior to your surgery  You are cleared for surgery.  HOLD PLAVIX FOR 5 DAYS PRIOR TO SURGERY  PLEASE KEEP THE FOLLOWING APPOINTMENTS:  >>Ultrasound 04/24/17 AT 1:00P  >>Dr Imogene Burn 04/24/17 at 3:00p  Azalee Course, PA recommends that you schedule a follow-up appointment in 3-4 months with Dr Swaziland.  If you need a refill on your cardiac medications before your next appointment, please call your pharmacy.

## 2017-04-13 NOTE — Progress Notes (Signed)
Cardiology Office Note    Date:  04/15/2017   ID:  Paul Chen 14-Sep-1940, MRN [id number]  PCP:  Carmin Richmond, MD  Cardiologist:  Dr. Swaziland  Chief Complaint  Patient presents with  . Medical Clearance    History of Present Illness:  Paul Chen is a 77 y.o. male with PMH of COPD, CKD stage III, IDDM, HTN, PAD, obesity hypoventilation syndrome, history of DVT and PE in 2010, moderate to severe pulmonary hypertension, obstructive sleep apnea and CAD.  He underwent sequential DES x3 to proximal to mid RCA in 2010.  Repeat cardiac catheterization in 2011 showed patent stents.  Myoview in August 2012 showed inferior infarct without ischemia, EF 51%.  He is on chronic Plavix and the Coumadin therapy for his coronary artery disease and also PE.  Carotid Doppler in April 2018 showed moderate RICA disease and a mild left ICA disease.  He was last seen by Dr. Swaziland in June 2018, he subsequently underwent a stress test on 07/28/2016 which showed EF 52%, small inferoapical defect that improves during recovery, does not appears to represent significant ischemia and may represent soft tissue attenuation, overall considered low risk scan.  He is being followed by [facility] for peripheral arterial disease.  Last lower extremity Doppler in August 2018 showed arterial obstruction involving the right SFA, popliteal artery and of tibial peroneal vessel, arterial obstruction also involving the left aortoiliac segment.  Patient presents today for preop clearance prior to left toe amputation.  I am unable to view the orthopedic office note.  He denies any recent chest pain or shortness of breath.  He has very limited functional ability at home.  Since he is largely asymptomatic and his last Myoview 78-month ago was low risk, he is cleared to proceed with surgery from cardiology perspective.  The original preop request did not mention Plavix, however he may come off the Plavix for 5 days prior to the surgery.  As  far as Coumadin, I have discussed the case with our clinical pharmacist.  He will need Lovenox bridging given the history of recurrent DVT/PE, this can be done at his Coumadin clinic with his PCPs office.  If they are not comfortable managing his Lovenox, our Coumadin clinic can temporarily managed her Lovenox bridging.  Alternatively, I also discussed with the patient potentially switch his Coumadin to 5 mg twice daily of Eliquis, we discussed the benefit and risk of the Eliquis, he will not require Lovenox bridging on Eliquis and may take himself off of Eliquis for 48 hours prior to procedure, for now he will continue on Coumadin.  My second question is related to his peripheral arterial disease.  He was previously seen by Dr. Geoffry Paradise of [facility] vascular surgery in August 2018.  Lower extremity arterial Doppler at the time suggested significant disease bilaterally.  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  It seems he was referred to Dr. Leonides Sake for further vascular workup.  I will request his last orthopedic office note.   Past Medical History:  Diagnosis Date  . Chronic renal insufficiency   . CKD (chronic kidney disease), stage III (HCC)   . COPD (chronic obstructive pulmonary disease) (HCC)    with restriction  . Coronary disease October 2010   Status post stenting of  the proximal to mid right coronary with DES.  Status post extensive stenting of the proximal right coronary to the crux.  He has moderate disease in  the left coronary system  . Depression   . Diabetes mellitus    Insulin Dependent  . Diastolic dysfunction    Congestive heart failure with  . Dyspnea    multifactorial. chronic  . Gout   . Hypertension   . Hypoventilation    syndrome  . Obesity   . Pulmonary embolism [facility]) March  2010   hx of and DVT in March of 2010 on chronic Coumadin therapy  . Pulmonary hypertension (HCC)    Moderate to severe  . Retroperitoneal bleed    Hx of right pelvic  . Sleep apnea    Obstructive     Past Surgical History:  Procedure Laterality Date  . BACK SURGERY  2000   post  . CARDIOVASCULAR STRESS TEST  11-14-2008   EF 44%  . EXPLORATORY LAPAROTOMY  1972   POST  . US ECHOCARDIOGRAPHY  11-25-2008   EF 55-60%    Current Medications: Outpatient Medications Prior to Visit  Medication Sig Dispense Refill  . allopurinol (ZYLOPRIM) 100 MG tablet Take 100 mg by mouth daily.      Marland Kitchen allopurinol (ZYLOPRIM) 300 MG tablet Take 300 mg by mouth daily. Take 1 tab by mouth daily  11  . amLODipine (NORVASC) 10 MG tablet TAKE ONE TABLET BY MOUTH DAILY 90 tablet 3  . atorvastatin (LIPITOR) 40 MG tablet Take 1 tablet (40 mg total) by mouth daily. 90 tablet 3  . B-D INS SYR ULTRAFINE .3CC/30G 30G X 1/2" 0.3 ML MISC     . B-D UF III MINI PEN NEEDLES 31G X 5 MM MISC as directed.    . busPIRone (BUSPAR) 15 MG tablet Take 1 tablet by mouth 2 (two) times daily.    . clopidogrel (PLAVIX) 75 MG tablet TAKE ONE TABLET EVERY MORNING 30 tablet 8  . Coenzyme Q10 (COQ10 PO) Take 1 capsule by mouth daily.    . cyclobenzaprine (FLEXERIL) 5 MG tablet Take 0.5 tablets by mouth 3 (three) times daily as needed.    . desonide (DESOWEN) 0.05 % lotion as directed.    . doxazosin (CARDURA) 4 MG tablet Take 4 mg by mouth daily.      . DULoxetine (CYMBALTA) 60 MG capsule Take 60 mg by mouth daily.      Marland Kitchen FLOVENT HFA 44 MCG/ACT inhaler Inhale into the lungs as directed.     . furosemide (LASIX) 20 MG tablet Take 10 mg by mouth daily. Take half a tab by mouth daily  11  . glucagon 1 MG injection Inject 1 mg into the vein as directed.    . insulin aspart (NOVOLOG FLEXPEN) 100 UNIT/ML SOPN FlexPen Inject 1-3 Units into the skin as directed.    Marland Kitchen ipratropium (ATROVENT) 0.02 % nebulizer solution Take 0.5 mg by  nebulization 4 (four) times daily.    Marland Kitchen LANTUS SOLOSTAR 100 UNIT/ML Solostar Pen Inject 60 Units into the muscle. INJECT 60 UNITS UNDER THE SKIN DAILY. MAY INCREASE UP TO 100 UNITS TOTAL PER DAY AS DIRECTED BY DIABETES CLINIC  11  . lisinopril (PRINIVIL,ZESTRIL) 5 MG tablet Take 2.5 mg by mouth daily.    Marland Kitchen LORazepam (ATIVAN) 0.5 MG tablet Take 0.5 mg by mouth every 8 (eight) hours as needed.     . magnesium oxide (MAG-OX) 400 MG tablet Take 400 mg by mouth 2 (two)  times daily.    . metoprolol succinate (TOPROL-XL) 100 MG 24 hr tablet Take 1.5 tablets by mouth daily.    Marland Kitchen. morphine (AVINZA) 120 MG 24 hr capsule Take 120 mg by mouth daily. As directed    . Multiple Vitamin (MULTIVITAMIN PO) Take by mouth daily.      Marland Kitchen. NAFTIN 2 % CREA as directed.    . nitroGLYCERIN (NITROSTAT) 0.4 MG SL tablet Place 0.4 mg under the tongue every 5 (five) minutes as needed.      Marland Kitchen. oxyCODONE (ROXICODONE) 15 MG immediate release tablet 6 (six) times daily.     . Sildenafil Citrate (VIAGRA PO) Take by mouth.      . Tamsulosin HCl (FLOMAX) 0.4 MG CAPS Take 0.4 mg by mouth daily.    Marland Kitchen. VICTOZA 18 MG/3ML SOPN 0.2 mLs daily.    Marland Kitchen. warfarin (COUMADIN) 2.5 MG tablet Take 1 tablet by mouth daily. Take 1 tab by mouth Tuesdays and Fridays  5  . warfarin (COUMADIN) 3 MG tablet Take 3 mg by mouth as directed.      Marland Kitchen. ZOSTAVAX [id number] UNT/0.65ML injection     . gabapentin (NEURONTIN) 300 MG capsule Take 300 mg by mouth 3 (three) times daily.       No facility-administered medications prior to visit.      Allergies:   Patient has no known allergies.   Social History   Socioeconomic History  . Marital status: Divorced    Spouse name: None  . Number of children: 3  . Years of education: None  . Highest education level: None  Social Needs  . Financial resource strain: None  . Food insecurity - worry: None  . Food insecurity - inability: None  . Transportation needs - medical: None  . Transportation needs - non-medical: None   Occupational History  . Occupation: disabled    Employer: RETIRED  Tobacco Use  . Smoking status: Former Smoker    Last attempt to quit: 02/01/1980    Years since quitting: 37.2  . Smokeless tobacco: Never Used  Substance and Sexual Activity  . Alcohol use: Yes    Comment: Wine Occasionally  . Drug use: None  . Sexual activity: None  Other Topics Concern  . None  Social History Narrative  . None     Family History:  The patient's family history includes Coronary artery disease in his father and mother; Heart attack in his father and mother; Hypertension in his mother.   ROS:   Please see the history of present illness.    ROS All other systems reviewed and are negative.   PHYSICAL EXAM:   VS:  BP 138/60   Pulse 65   Ht 5\' 10"  (1.778 m)   Wt 265 lb 12.8 oz (120.6 kg)   BMI 38.14 kg/m    GEN: Well nourished, well developed, in no acute distress  HEENT: normal  Neck: no JVD, carotid bruits, or masses Cardiac: RRR; no murmurs, rubs, or gallops,no edema  Respiratory:  clear to auscultation bilaterally, normal work of breathing GI: soft, nontender, nondistended, + BS MS: no deformity or atrophy  Skin: warm and dry, no rash Neuro:  Alert and Oriented x 3, Strength and sensation are intact Psych: euthymic mood, full affect  Wt Readings from Last 3 Encounters:  04/13/17 265 lb 12.8 oz (120.6 kg)  07/28/16 257 lb (116.6 kg)  07/14/16 257 lb 9.6 oz (116.8 kg)      Studies/Labs Reviewed:   EKG:  EKG  is ordered today.  The ekg ordered today demonstrates normal sinus rhythm without significant ST-T wave changes  Recent Labs: No results found for requested labs within last 8760 hours.   Lipid Panel    Component Value Date/Time   CHOL  11/27/2008 1038    187        ATP III CLASSIFICATION:  <200     mg/dL   Desirable  161-096  mg/dL   Borderline High  >=045    mg/dL   High          TRIG 409 (H) 11/27/2008 1038   HDL 22 (L) 11/27/2008 1038   CHOLHDL 8.5 11/27/2008  1038   VLDL 36 11/27/2008 1038   LDLCALC (H) 11/27/2008 1038    129        Total Cholesterol/HDL:CHD Risk Coronary Heart Disease Risk Table                     Men   Women  1/2 Average Risk   3.4   3.3  Average Risk       5.0   4.4  2 X Average Risk   9.6   7.1  3 X Average Risk  23.4   11.0        Use the calculated Patient Ratio above and the CHD Risk Table to determine the patient's CHD Risk.        ATP III CLASSIFICATION (LDL):  <100     mg/dL   Optimal  811-914  mg/dL   Near or Above                    Optimal  130-159  mg/dL   Borderline  782-956  mg/dL   High  >213     mg/dL   Very High    Additional studies/ records that were reviewed today include:   Myoview 07/28/2016 Study Highlights    Nuclear stress EF: 52%.  Small inferoapical defect that improves during recovery. Overall does not appear to represent significant ischemia. May represent soft tissue attenuation  Ovreall low risk scan        ASSESSMENT:    1. Preop cardiovascular exam   2. Essential hypertension   3. CKD (chronic kidney disease), stage III (HCC)   4. Chronic obstructive pulmonary disease, unspecified COPD type (HCC)   5. Controlled type 2 diabetes mellitus without complication, with long-term current use of insulin (HCC)   6. PAD (peripheral artery disease) (HCC)   7. Other chronic pulmonary embolism without acute cor pulmonale (HCC)   8. Coronary artery disease involving native coronary artery of native heart without angina pectoris      PLAN:  In order of problems listed above:  1. Preoperative clearance: Pending hallux amputation by Dr. Toni Arthurs.  Unfortunately, I am unable to review the orthopedic office note.  Patient denies any chest pain or shortness of breath.  He has very poor functional ability at home.  His last Myoview was 9 months ago which was low risk.  Given lack of symptom, no further workup is needed prior to the surgery and he is cleared to proceed with surgery.   Although the preop request did not mention Plavix, he is on Plavix and this should be stopped 5 days prior to the surgery.  It is unclear to me at this point whether or not his lower extremity can be salvaged by vascular surgery service.  He has upcoming visit with Dr. Imogene Burn.  He used to see [facility] surgery at [facility].  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  As far as Coumadin, this will need to be stopped for 5 days prior to the surgery, however given his recurrent DVT/PE, he will need Lovenox bridging based on current protocol.  He manage his Coumadin at his PCPs office, I will defer this to his Coumadin clinic.  Of course, our Coumadin clinic can temporarily take over the Lovenox bridging part if his regular Coumadin clinic does not feel comfortable manage Lovenox.  2. PAD: He had significantly abnormal ABI last year.  He was seen by vascular surgery at that time.  Again it is unclear to me whether or not there is any way to improve the blood flow to the lower extremity.  Patient symptoms to suggest left toe has already reached osteomyelitis.  3. Recurrent DVT/PE: On Coumadin, managed by primary care providers office.  Will defer to his usual Coumadin clinic for Lovenox bridging.   4. CAD: No chest pain, last Myoview was in June 2018 which was low risk.  5. Hypertension: Blood pressure very well controlled  6. Hyperlipidemia: On 40 mg daily of Lipitor.  Will defer annual lipid panel to primary care provider.  I have not seen a lipid panel since 2010 in epic system.  7. DM 2: Managed by primary care provider, on insulin.  I am unable to view the last hemoglobin A1c.  Will need to have diabetes tightly controlled given his combination of lower extremity ischemia, peripheral arterial disease, and a history of coronary artery disease.    Medication Adjustments/Labs and Tests Ordered: Current medicines are reviewed at length with the patient today.  Concerns  regarding medicines are outlined above.  Medication changes, Labs and Tests ordered today are listed in the Patient Instructions below. Patient Instructions  CONTACT YOUR PRIMARY CARE DOCTOR'S COUMADIN CLINIC FOR AN APPOINTMENT AND LOVENOX BRIDGING. Appointment needs to be AT LEAST 5 days prior to your surgery  You are cleared for surgery.  HOLD PLAVIX FOR 5 DAYS PRIOR TO SURGERY  PLEASE KEEP THE FOLLOWING APPOINTMENTS:  >>Ultrasound 04/24/17 AT 1:00P  >>Dr Imogene Burn 04/24/17 at 3:00p  Azalee Course, PA recommends that you schedule a follow-up appointment in 3-4 months with Dr Swaziland.  If you need a refill on your cardiac medications before your next appointment, please call your pharmacy.    Ramond Dial, [place]  04/15/2017 11:25 AM    [facility] [address] [place], [place], [place]  [address] Phone: [phone]; Fax: [phone]

## 2017-04-15 ENCOUNTER — Encounter: Payer: Self-pay | Admitting: Physician Assistant

## 2017-04-17 ENCOUNTER — Other Ambulatory Visit: Payer: Self-pay | Admitting: Cardiology

## 2017-04-21 ENCOUNTER — Telehealth: Payer: Self-pay | Admitting: Cardiology

## 2017-04-21 NOTE — Telephone Encounter (Signed)
Patient calling states he was instructed to call our office for instructions for a procedure on 05-04-17

## 2017-04-21 NOTE — Telephone Encounter (Signed)
Returned call to patient.He was calling to find out what is was suppose to do before up coming surgery.Advised he needs to call PCP to schedule appointment for Lovenox bridge.

## 2017-04-24 ENCOUNTER — Encounter: Payer: Self-pay | Admitting: *Deleted

## 2017-04-24 ENCOUNTER — Other Ambulatory Visit: Payer: Self-pay

## 2017-04-24 ENCOUNTER — Ambulatory Visit (INDEPENDENT_AMBULATORY_CARE_PROVIDER_SITE_OTHER): Payer: Medicare HMO | Admitting: Vascular Surgery

## 2017-04-24 ENCOUNTER — Ambulatory Visit (HOSPITAL_COMMUNITY)
Admission: RE | Admit: 2017-04-24 | Discharge: 2017-04-24 | Disposition: A | Discharge status: Home / Self Care | Payer: Medicare HMO | Source: Ambulatory Visit | Attending: Vascular Surgery | Admitting: Vascular Surgery

## 2017-04-24 ENCOUNTER — Encounter: Payer: Self-pay | Admitting: Vascular Surgery

## 2017-04-24 ENCOUNTER — Other Ambulatory Visit: Payer: Self-pay | Admitting: *Deleted

## 2017-04-24 DIAGNOSIS — I7025 Atherosclerosis of native arteries of other extremities with ulceration: Secondary | ICD-10-CM | POA: Insufficient documentation

## 2017-04-24 DIAGNOSIS — L98499 Non-pressure chronic ulcer of skin of other sites with unspecified severity: Secondary | ICD-10-CM | POA: Insufficient documentation

## 2017-04-24 DIAGNOSIS — I739 Peripheral vascular disease, unspecified: Secondary | ICD-10-CM | POA: Diagnosis not present

## 2017-04-24 NOTE — Progress Notes (Signed)
Requested by:  Carmin Richmond, MD [facility] [address] [facility] [address] 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 (May 14, 1940) male who presents with chief complaint: bilateral toe ulcers.  Onset of symptoms occurred >6 months ago.  Pain is described as aching to sharp, severity 1-5/10, and associated with manipulating B great toes.  Patient has attempted to treat this pain with wound care.  Patient denies any rest pain.  He believes that his L great toe has healed some from its initial presentation. He denies any fever but does not some serous drainage from his great toe.  He recently had some skin breakdown in in calves associated with venous stasis ulcer development.  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  He also has known CHF and CAD.  Atherosclerotic risk factors include: DM, HTN and prior smoker.  Past Medical History:  Diagnosis Date  . Chronic renal insufficiency   . CKD (chronic kidney disease), stage III (HCC)   . COPD (chronic obstructive pulmonary disease) (HCC)    with restriction  . Coronary disease October 2010   Status post stenting of the proximal to mid right coronary with DES.  Status post extensive stenting of the proximal right coronary to the crux.  He has moderate disease in  the left coronary system  . Depression   . Diabetes mellitus    Insulin Dependent  . Diastolic dysfunction    Congestive heart failure with  . Dyspnea    multifactorial. chronic  . Gout   . Hypertension   . Hypoventilation    syndrome  . Obesity   . Pulmonary embolism [facility]) March 2010   hx of and DVT in March of 2010 on chronic Coumadin therapy  . Pulmonary hypertension (HCC)    Moderate to severe  . Retroperitoneal bleed    Hx of right pelvic  . Sleep apnea    Obstructive     Past Surgical History:  Procedure Laterality Date  . BACK SURGERY  2000   post  . CARDIOVASCULAR STRESS TEST  11-14-2008   EF 44%  . EXPLORATORY LAPAROTOMY  1972   POST  . US ECHOCARDIOGRAPHY  11-25-2008   EF 55-60%    Social History   Socioeconomic History  . Marital status: Divorced    Spouse name: Not on file  . Number of children: 3  . Years of education: Not on file  . Highest education level: Not on file  Occupational History  . Occupation: disabled    Employer: RETIRED  Social Needs  . Financial resource strain: Not on file  . Food insecurity:    Worry: Not on file    Inability: Not on file  . Transportation needs:    Medical: Not on file    Non-medical: Not on file  Tobacco Use  . Smoking status: Former Smoker    Last attempt to quit: 02/01/1980    Years since quitting: 37.2  . Smokeless tobacco: Never Used  Substance and Sexual Activity  . Alcohol use: Yes    Comment: Wine Occasionally  . Drug use: Not on file  . Sexual activity: Not on file  Lifestyle  . Physical activity:    Days per week: Not on file    Minutes per session: Not on file  . Stress: Not on file  Relationships  . Social connections:  Talks on phone: Not on file    Gets together: Not on file    Attends religious service: Not on file    Active member of club or organization: Not on file    Attends meetings of clubs or organizations: Not on file    Relationship status: Not on file  . Intimate partner violence:    Fear of current or ex partner: Not on file    Emotionally abused: Not on file    Physically abused: Not on file    Forced sexual activity: Not on file  Other Topics Concern  . Not on file  Social History Narrative  . Not on file    Family History  Problem Relation Age of Onset  . Heart attack Mother   . Coronary artery disease Mother   . Hypertension Mother   . Coronary artery disease Father   . Heart attack Father     Current Outpatient Medications  Medication Sig Dispense Refill  . allopurinol (ZYLOPRIM) 100 MG tablet Take 100 mg by  mouth daily.      Marland Kitchen allopurinol (ZYLOPRIM) 300 MG tablet Take 300 mg by mouth daily. Take 1 tab by mouth daily  11  . amLODipine (NORVASC) 10 MG tablet TAKE ONE TABLET BY MOUTH DAILY 90 tablet 3  . atorvastatin (LIPITOR) 40 MG tablet Take 1 tablet (40 mg total) by mouth daily. 90 tablet 3  . B-D INS SYR ULTRAFINE .3CC/30G 30G X 1/2" 0.3 ML MISC     . B-D UF III MINI PEN NEEDLES 31G X 5 MM MISC as directed.    . busPIRone (BUSPAR) 15 MG tablet Take 1 tablet by mouth 2 (two) times daily.    . clopidogrel (PLAVIX) 75 MG tablet TAKE ONE TABLET BY MOUTH EVERY MORNING 30 tablet 11  . Coenzyme Q10 (COQ10 PO) Take 1 capsule by mouth daily.    . cyclobenzaprine (FLEXERIL) 5 MG tablet Take 0.5 tablets by mouth 3 (three) times daily as needed.    . desonide (DESOWEN) 0.05 % lotion as directed.    . doxazosin (CARDURA) 4 MG tablet Take 4 mg by mouth daily.      . DULoxetine (CYMBALTA) 60 MG capsule Take 60 mg by mouth daily.      Marland Kitchen FLOVENT HFA 44 MCG/ACT inhaler Inhale into the lungs as directed.     . furosemide (LASIX) 20 MG tablet Take 10 mg by mouth daily. Take half a tab by mouth daily  11  . gabapentin (NEURONTIN) 300 MG capsule Take 300 mg by mouth 3 (three) times daily.      Marland Kitchen glucagon 1 MG injection Inject 1 mg into the vein as directed.    . insulin aspart (NOVOLOG FLEXPEN) 100 UNIT/ML SOPN FlexPen Inject 1-3 Units into the skin as directed.    Marland Kitchen ipratropium (ATROVENT) 0.02 % nebulizer solution Take 0.5 mg by nebulization 4 (four) times daily.    Marland Kitchen LANTUS SOLOSTAR 100 UNIT/ML Solostar Pen Inject 60 Units into the muscle. INJECT 60 UNITS UNDER THE SKIN DAILY. MAY INCREASE UP TO 100 UNITS TOTAL PER DAY AS DIRECTED BY DIABETES CLINIC  11  . lisinopril (PRINIVIL,ZESTRIL) 5 MG tablet Take 2.5 mg by mouth daily.    Marland Kitchen LORazepam (ATIVAN) 0.5 MG tablet Take 0.5 mg by mouth every 8 (eight) hours as needed.     . magnesium oxide (MAG-OX) 400 MG tablet Take 400 mg by mouth 2 (two) times daily.    .  metoprolol succinate (TOPROL-XL)  100 MG 24 hr tablet Take 1.5 tablets by mouth daily.    Marland Kitchen morphine (AVINZA) 120 MG 24 hr capsule Take 120 mg by mouth daily. As directed    . Multiple Vitamin (MULTIVITAMIN PO) Take by mouth daily.      Marland Kitchen NAFTIN 2 % CREA as directed.    . nitroGLYCERIN (NITROSTAT) 0.4 MG SL tablet Place 0.4 mg under the tongue every 5 (five) minutes as needed.      Marland Kitchen oxyCODONE (ROXICODONE) 15 MG immediate release tablet 6 (six) times daily.     . Sildenafil Citrate (VIAGRA PO) Take by mouth.      . Tamsulosin HCl (FLOMAX) 0.4 MG CAPS Take 0.4 mg by mouth daily.    Marland Kitchen VICTOZA 18 MG/3ML SOPN 0.2 mLs daily.    Marland Kitchen warfarin (COUMADIN) 2.5 MG tablet Take 1 tablet by mouth daily. Take 1 tab by mouth Tuesdays and Fridays  5  . warfarin (COUMADIN) 3 MG tablet Take 3 mg by mouth as directed.      Marland Kitchen ZOSTAVAX [address] UNT/0.65ML injection      No current facility-administered medications for this visit.     No Known Allergies  REVIEW OF SYSTEMS (negative unless checked):   Cardiac:  []  Chest pain or chest pressure? [x]  Shortness of breath upon activity? [x]  Shortness of breath when lying flat? []  Irregular heart rhythm?  Vascular:  [x]  Pain in calf, thigh, or hip brought on by walking? []  Pain in feet at night that wakes you up from your sleep? []  Blood clot in your veins? []  Leg swelling?  Pulmonary:  [x]  Oxygen at home? [x]  Productive cough? [x]  Wheezing?  Neurologic:  [x]  Sudden weakness in arms or legs? [x]  Sudden numbness in arms or legs? []  Sudden onset of difficult speaking or slurred speech? []  Temporary loss of vision in one eye? []  Problems with dizziness?  Gastrointestinal:  []  Blood in stool? []  Vomited blood?  Genitourinary:  []  Burning when urinating? []  Blood in urine?  Psychiatric:  []  Major depression  Hematologic:  []  Bleeding problems? []  Problems with blood clotting?  Dermatologic:  []  Rashes or ulcers?  Constitutional:  []  Fever or  chills?  Ear/Nose/Throat:  []  Change in hearing? []  Nose bleeds? []  Sore throat?  Musculoskeletal:  []  Back pain? []  Joint pain? []  Muscle pain?   For VQI Use Only   PRE-ADM LIVING Home  AMB STATUS Ambulatory  CAD Sx History of MI, but no symptoms No MI within 6 months  PRIOR CHF Mild  STRESS TEST No    Physical Examination     Vitals:   04/24/17 1407 04/24/17 1410  BP: (!) 171/70 (!) 155/65  Pulse: 72 72  Resp: 18   Temp: (!) 97.2 F (36.2 C)   TempSrc: Oral   SpO2: 95%   Weight: 263 lb (119.3 kg)   Height: 5\' 9"  (1.753 m)    Body mass index is 38.84 kg/m.  General Alert, O x 3, WD, NAD  Head [place]/AT,    Ear/Nose/ Throat Hearing grossly intact, nares without erythema or drainage, oropharynx without Erythema or Exudate, Mallampati score: 3,   Eyes PERRLA, EOMI,    Neck Supple, mid-line trachea,    Pulmonary Sym exp, good B air movt, CTA B  Cardiac RRR, Nl S1, S2, no Murmurs, No rubs, No S3,S4, no obvious JVD  Vascular Vessel Right Left  Radial Palpable Palpable  Brachial Palpable Palpable  Carotid Palpable, No Bruit Palpable, No Bruit  Aorta Not  palpable N/A  Femoral Palpable Palpable  Popliteal Not palpable Not palpable  PT Not palpable Not palpable  DP Not palpable Not palpable    Gastro- intestinal soft, non-distended, non-tender to palpation, No guarding or rebound, no HSM, no masses, no CVAT B, No palpable prominent aortic pulse,    Musculo- skeletal M/S 5/5 throughout  , Extremities without ischemic changes except B toe ulcers: R with more necrotic tissue at tip than L, Pitting edema present: B, No visible varicosities , No Lipodermatosclerosis present, B thigh also swollen  Neurologic Cranial nerves 2-12 intact , Pain and light touch intact in extremities , Motor exam as listed above  Psychiatric Judgement intact, Mood & affect appropriate for pt's clinical situation  Dermatologic See M/S exam for extremity exam, No rashes otherwise noted    Lymphatic  Palpable lymph nodes: None    Non-Invasive Vascular Imaging   ABI (04/24/2017)  R:   ABI: 0.73  PT: mono  DP: mono  TBI:  ND  L:   ABI: 0.85,   PT: mono  DP: mono  TBI: ND   Outside Studies/Documentation   15 pages of outside documents were reviewed including: outpatient ORTHO chart, MRI reports.   Medical Decision Making   Paul Chen is a 77 y.o. 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  Patient needs angiography to get an accurate evaluation of his PAD and optimization given he is already scheduled for amputation on 4 APR 19.  I discussed with the patient the natural history of critical limb ischemia: 25% require amputation in one year, 50% are able to maintain their limbs in one year, and 25-30% die in one year due to comorbidities.  Given the limb threatening status of this patient, I recommend an aggressive work up including proceeding with an: Aortogram, Bilateral runoff and possible Left leg intervention. Will need to recheck Cr the day of the procedure to see if Carbon Dioxide and limited contrast will be needed. Patient need possible intervention on the L leg as the left great toe will be amputated first. Would then reschedule patient 2 weeks later for a RRo and possible intervention as the R great toe will likely need amputation also. I discussed with the patient the nature of angiographic procedures, especially the limited patencies of any endovascular intervention. The patient is aware of that the risks of an angiographic procedure include but are not limited to: bleeding, infection, access site complications, embolization, rupture of treated vessel, dissection, possible need for emergent surgical intervention, and possible need for surgical procedures to treat the patient's pathology. The patient is aware  of the risks and agrees to proceed.  The procedure is scheduled for: 1 APR 19 with Dr. Randie Heinz as I'm on leave.  I discussed in depth with the patient the nature of atherosclerosis, and emphasized the importance of maximal medical management including strict control of blood pressure, blood glucose, and lipid levels, antiplatelet agents, obtaining regular exercise, and cessation of smoking.  The patient is aware that without maximal medical management the underlying atherosclerotic disease process will progress, limiting the benefit of any interventions. The patient is currently on a statin: Lipitor.  The patient is currently on an anti-platelet: Plavix.  Thank you for allowing [place] to participate in this patient's care.   Leonides Sake, MD, FACS [facility] Office: [phone] Pager: [phone]  04/24/2017, 1:22 PM

## 2017-04-26 ENCOUNTER — Telehealth: Payer: Self-pay | Admitting: *Deleted

## 2017-04-26 ENCOUNTER — Other Ambulatory Visit: Payer: Self-pay | Admitting: Orthopedic Surgery

## 2017-04-26 NOTE — Telephone Encounter (Signed)
After multiple attempts trying to get office on phone yesterday. Spoke to Megan(Dr. [phone]'s nurse) at Dr. Lois HuxleyJames Davis office. "Phones have been out for about 2 days". Informed her I need Coumadin clearance for this patient with Lovenox bridging if indicated. Form has been faxed to office. ACTION REQUIRED TODAY. Asked them to contact patient if Lovenox indicated.

## 2017-04-27 ENCOUNTER — Telehealth: Payer: Self-pay | Admitting: *Deleted

## 2017-04-27 NOTE — Telephone Encounter (Signed)
[facility] Fayrene FearingJames Davis's office late pm on 04/26/17. No action on coumadin clearance because Dr. Earlene Plateravis os out of office. Informed her I needed to hear back from regarding patient and Coumadin first thing in the morning on 04/27/17. Attempted to call patient no answer left message.

## 2017-04-27 NOTE — Telephone Encounter (Signed)
Megan(Nurse) at Dr. Lois HuxleyJames Davis office confirmed that he has cleared patient to hold Coumadin x 5 days prior to procedure and will determine if Lovenox bridging needed when patient is seen in Dr. Terrill MohrJames Davis's office today 04/27/17. ALSO called patient and left message with this information.

## 2017-04-28 ENCOUNTER — Other Ambulatory Visit: Payer: Self-pay

## 2017-04-28 ENCOUNTER — Encounter: Payer: Self-pay | Admitting: Orthopedic Surgery

## 2017-04-28 ENCOUNTER — Encounter (HOSPITAL_BASED_OUTPATIENT_CLINIC_OR_DEPARTMENT_OTHER): Payer: Self-pay | Admitting: *Deleted

## 2017-04-28 NOTE — Progress Notes (Signed)
   04/28/17 1451  OBSTRUCTIVE SLEEP APNEA  Have you ever been diagnosed with sleep apnea through a sleep study? Yes  If yes, do you have and use a CPAP or BPAP machine every night? 0  Do you snore loudly (loud enough to be heard through closed doors)?  0  Do you often feel tired, fatigued, or sleepy during the daytime (such as falling asleep during driving or talking to someone)? 0  Has anyone observed you stop breathing during your sleep? 0  Do you have, or are you being treated for high blood pressure? 1  BMI more than 35 kg/m2? 1  Age > 50 (1-yes) 1  Neck circumference greater than:Male 16 inches or larger, Male 17inches or larger? 0  Male Gender (Yes=1) 1  Obstructive Sleep Apnea Score 4

## 2017-05-01 ENCOUNTER — Ambulatory Visit (HOSPITAL_COMMUNITY)
Admission: RE | Admit: 2017-05-01 | Discharge: 2017-05-01 | Disposition: A | Discharge status: Home / Self Care | Payer: Medicare PPO | Source: Ambulatory Visit | Attending: Vascular Surgery | Admitting: Vascular Surgery

## 2017-05-01 ENCOUNTER — Ambulatory Visit (HOSPITAL_COMMUNITY)
Admission: RE | Disposition: A | Discharge status: Home / Self Care | Payer: Self-pay | Source: Ambulatory Visit | Attending: Vascular Surgery

## 2017-05-01 DIAGNOSIS — J449 Chronic obstructive pulmonary disease, unspecified: Secondary | ICD-10-CM | POA: Insufficient documentation

## 2017-05-01 DIAGNOSIS — M869 Osteomyelitis, unspecified: Secondary | ICD-10-CM | POA: Insufficient documentation

## 2017-05-01 DIAGNOSIS — E1151 Type 2 diabetes mellitus with diabetic peripheral angiopathy without gangrene: Secondary | ICD-10-CM | POA: Insufficient documentation

## 2017-05-01 DIAGNOSIS — Z794 Long term (current) use of insulin: Secondary | ICD-10-CM | POA: Insufficient documentation

## 2017-05-01 DIAGNOSIS — I2510 Atherosclerotic heart disease of native coronary artery without angina pectoris: Secondary | ICD-10-CM | POA: Diagnosis not present

## 2017-05-01 DIAGNOSIS — L97529 Non-pressure chronic ulcer of other part of left foot with unspecified severity: Secondary | ICD-10-CM | POA: Diagnosis not present

## 2017-05-01 DIAGNOSIS — Z955 Presence of coronary angioplasty implant and graft: Secondary | ICD-10-CM | POA: Insufficient documentation

## 2017-05-01 DIAGNOSIS — Z7901 Long term (current) use of anticoagulants: Secondary | ICD-10-CM | POA: Insufficient documentation

## 2017-05-01 DIAGNOSIS — M109 Gout, unspecified: Secondary | ICD-10-CM | POA: Diagnosis not present

## 2017-05-01 DIAGNOSIS — Z79891 Long term (current) use of opiate analgesic: Secondary | ICD-10-CM | POA: Diagnosis not present

## 2017-05-01 DIAGNOSIS — N183 Chronic kidney disease, stage 3 (moderate): Secondary | ICD-10-CM | POA: Insufficient documentation

## 2017-05-01 DIAGNOSIS — Z79899 Other long term (current) drug therapy: Secondary | ICD-10-CM | POA: Insufficient documentation

## 2017-05-01 DIAGNOSIS — I70245 Atherosclerosis of native arteries of left leg with ulceration of other part of foot: Secondary | ICD-10-CM | POA: Diagnosis not present

## 2017-05-01 DIAGNOSIS — I130 Hypertensive heart and chronic kidney disease with heart failure and stage 1 through stage 4 chronic kidney disease, or unspecified chronic kidney disease: Secondary | ICD-10-CM | POA: Insufficient documentation

## 2017-05-01 DIAGNOSIS — F329 Major depressive disorder, single episode, unspecified: Secondary | ICD-10-CM | POA: Insufficient documentation

## 2017-05-01 DIAGNOSIS — I509 Heart failure, unspecified: Secondary | ICD-10-CM | POA: Insufficient documentation

## 2017-05-01 DIAGNOSIS — Z8249 Family history of ischemic heart disease and other diseases of the circulatory system: Secondary | ICD-10-CM | POA: Insufficient documentation

## 2017-05-01 DIAGNOSIS — E669 Obesity, unspecified: Secondary | ICD-10-CM | POA: Diagnosis not present

## 2017-05-01 DIAGNOSIS — Z86711 Personal history of pulmonary embolism: Secondary | ICD-10-CM | POA: Diagnosis not present

## 2017-05-01 DIAGNOSIS — I272 Pulmonary hypertension, unspecified: Secondary | ICD-10-CM | POA: Diagnosis not present

## 2017-05-01 DIAGNOSIS — I70235 Atherosclerosis of native arteries of right leg with ulceration of other part of foot: Secondary | ICD-10-CM | POA: Insufficient documentation

## 2017-05-01 DIAGNOSIS — E1169 Type 2 diabetes mellitus with other specified complication: Secondary | ICD-10-CM | POA: Insufficient documentation

## 2017-05-01 DIAGNOSIS — Z87891 Personal history of nicotine dependence: Secondary | ICD-10-CM | POA: Insufficient documentation

## 2017-05-01 DIAGNOSIS — Z7902 Long term (current) use of antithrombotics/antiplatelets: Secondary | ICD-10-CM | POA: Insufficient documentation

## 2017-05-01 DIAGNOSIS — I998 Other disorder of circulatory system: Secondary | ICD-10-CM | POA: Diagnosis not present

## 2017-05-01 DIAGNOSIS — L97519 Non-pressure chronic ulcer of other part of right foot with unspecified severity: Secondary | ICD-10-CM | POA: Diagnosis not present

## 2017-05-01 DIAGNOSIS — Z86718 Personal history of other venous thrombosis and embolism: Secondary | ICD-10-CM | POA: Insufficient documentation

## 2017-05-01 HISTORY — PX: PERIPHERAL VASCULAR ATHERECTOMY: CATH118256

## 2017-05-01 HISTORY — PX: LOWER EXTREMITY ANGIOGRAPHY: CATH118251

## 2017-05-01 HISTORY — PX: PERIPHERAL VASCULAR BALLOON ANGIOPLASTY: CATH118281

## 2017-05-01 LAB — PROTIME-INR
INR: 1.23
Prothrombin Time: 15.4 seconds — ABNORMAL HIGH (ref 11.4–15.2)

## 2017-05-01 LAB — POCT I-STAT, CHEM 8
BUN: 21 mg/dL — ABNORMAL HIGH (ref 6–20)
Calcium, Ion: 1.08 mmol/L — ABNORMAL LOW (ref 1.15–1.40)
Chloride: 99 mmol/L — ABNORMAL LOW (ref 101–111)
Creatinine, Ser: 1.1 mg/dL (ref 0.61–1.24)
Glucose, Bld: 142 mg/dL — ABNORMAL HIGH (ref 65–99)
HCT: 38 % — ABNORMAL LOW (ref 39.0–52.0)
Hemoglobin: 12.9 g/dL — ABNORMAL LOW (ref 13.0–17.0)
Potassium: 3.5 mmol/L (ref 3.5–5.1)
Sodium: 142 mmol/L (ref 135–145)
TCO2: 36 mmol/L — ABNORMAL HIGH (ref 22–32)

## 2017-05-01 LAB — POCT ACTIVATED CLOTTING TIME: Activated Clotting Time: 274 seconds

## 2017-05-01 LAB — GLUCOSE, CAPILLARY: Glucose-Capillary: 125 mg/dL — ABNORMAL HIGH (ref 65–99)

## 2017-05-01 SURGERY — LOWER EXTREMITY ANGIOGRAPHY
Anesthesia: LOCAL

## 2017-05-01 MED ORDER — LABETALOL HCL 5 MG/ML IV SOLN
INTRAVENOUS | Status: DC | PRN
  Administered 2017-05-01 (×2): 10 mg via INTRAVENOUS

## 2017-05-01 MED ORDER — HEPARIN SODIUM (PORCINE) 1000 UNIT/ML IJ SOLN
INTRAMUSCULAR | Status: AC
  Filled 2017-05-01: qty 1

## 2017-05-01 MED ORDER — LIDOCAINE HCL (PF) 1 % IJ SOLN
INTRAMUSCULAR | Status: DC | PRN
  Administered 2017-05-01: 10 mL

## 2017-05-01 MED ORDER — HEPARIN (PORCINE) IN NACL 2-0.9 UNIT/ML-% IJ SOLN
INTRAMUSCULAR | Status: AC | PRN
  Administered 2017-05-01: 1000 mL

## 2017-05-01 MED ORDER — FENTANYL CITRATE (PF) 100 MCG/2ML IJ SOLN
INTRAMUSCULAR | Status: DC | PRN
  Administered 2017-05-01: 25 ug via INTRAVENOUS

## 2017-05-01 MED ORDER — HYDRALAZINE HCL 20 MG/ML IJ SOLN
INTRAMUSCULAR | Status: AC
  Filled 2017-05-01: qty 1

## 2017-05-01 MED ORDER — MIDAZOLAM HCL 2 MG/2ML IJ SOLN
INTRAMUSCULAR | Status: AC
  Filled 2017-05-01: qty 2

## 2017-05-01 MED ORDER — HEPARIN (PORCINE) IN NACL 2-0.9 UNIT/ML-% IJ SOLN
INTRAMUSCULAR | Status: AC
  Filled 2017-05-01: qty 1000

## 2017-05-01 MED ORDER — ACETAMINOPHEN 325 MG PO TABS: 650.0000 mg | ORAL_TABLET | ORAL | Status: DC | PRN

## 2017-05-01 MED ORDER — LABETALOL HCL 5 MG/ML IV SOLN
10.0000 mg | INTRAVENOUS | Status: DC | PRN
  Administered 2017-05-01: 10 mg via INTRAVENOUS

## 2017-05-01 MED ORDER — MIDAZOLAM HCL 2 MG/2ML IJ SOLN
INTRAMUSCULAR | Status: DC | PRN
  Administered 2017-05-01: 1 mg via INTRAVENOUS

## 2017-05-01 MED ORDER — FENTANYL CITRATE (PF) 100 MCG/2ML IJ SOLN
INTRAMUSCULAR | Status: AC
  Filled 2017-05-01: qty 2

## 2017-05-01 MED ORDER — SODIUM CHLORIDE 0.9% FLUSH: 3.0000 mL | Freq: Two times a day (BID) | INTRAVENOUS | Status: DC

## 2017-05-01 MED ORDER — ONDANSETRON HCL 4 MG/2ML IJ SOLN
4.0000 mg | Freq: Four times a day (QID) | INTRAMUSCULAR | Status: DC | PRN
Start: 2017-05-01 — End: 2017-05-01

## 2017-05-01 MED ORDER — LABETALOL HCL 5 MG/ML IV SOLN
INTRAVENOUS | Status: AC
  Filled 2017-05-01: qty 4

## 2017-05-01 MED ORDER — LABETALOL HCL 5 MG/ML IV SOLN
10.0000 mg | Freq: Once | INTRAVENOUS | Status: AC
  Administered 2017-05-01: 10 mg via INTRAVENOUS

## 2017-05-01 MED ORDER — LIDOCAINE HCL 1 % IJ SOLN
INTRAMUSCULAR | Status: AC
  Filled 2017-05-01: qty 20

## 2017-05-01 MED ORDER — SODIUM CHLORIDE 0.9 % IV SOLN
INTRAVENOUS | Status: DC
  Administered 2017-05-01: 08:00:00 via INTRAVENOUS

## 2017-05-01 MED ORDER — SODIUM CHLORIDE 0.9% FLUSH: 3.0000 mL | INTRAVENOUS | Status: DC | PRN

## 2017-05-01 MED ORDER — HYDRALAZINE HCL 20 MG/ML IJ SOLN
5.0000 mg | INTRAMUSCULAR | Status: AC | PRN
  Administered 2017-05-01 (×2): 5 mg via INTRAVENOUS

## 2017-05-01 MED ORDER — SODIUM CHLORIDE 0.9 % IV SOLN: 250.0000 mL | INTRAVENOUS | Status: DC | PRN

## 2017-05-01 MED ORDER — HEPARIN SODIUM (PORCINE) 1000 UNIT/ML IJ SOLN
INTRAMUSCULAR | Status: DC | PRN
  Administered 2017-05-01: 12000 [IU] via INTRAVENOUS

## 2017-05-01 MED ORDER — SODIUM CHLORIDE 0.9 % WEIGHT BASED INFUSION: 1.0000 mL/kg/h | INTRAVENOUS | Status: DC

## 2017-05-01 MED ORDER — IODIXANOL 320 MG/ML IV SOLN
INTRAVENOUS | Status: DC | PRN
  Administered 2017-05-01: 124 mL via INTRAVENOUS

## 2017-05-01 SURGICAL SUPPLY — 31 items
BALLN ADMIRAL INPACT 6X250 (BALLOONS) ×3
BALLN STERLING OTW 3X100X150 (BALLOONS) ×3
BALLN STERLING OTW 6X220X150 (BALLOONS) ×3
BALLOON ADMIRAL INPACT 6X250 (BALLOONS) IMPLANT
BALLOON STERLING OTW 3X100X150 (BALLOONS) IMPLANT
BALLOON STERLING OTW 6X220X150 (BALLOONS) IMPLANT
BUR JETSTREAM XC 2.4/3.4 (BURR) IMPLANT
BURR JETSTREAM XC 2.4/3.4 (BURR) ×3
CATH CROSS OVER TEMPO 5F (CATHETERS) ×1 IMPLANT
CATH CXI SUPP ST 2.6FR 150CM (CATHETERS) ×1 IMPLANT
CATH OMNI FLUSH 5F 65CM (CATHETERS) ×1 IMPLANT
COVER PRB 48X5XTLSCP FOLD TPE (BAG) IMPLANT
COVER PROBE 5X48 (BAG) ×3
DEVICE CLOSURE MYNXGRIP 6/7F (Vascular Products) ×1 IMPLANT
FILTER CO2 0.2 MICRON (VASCULAR PRODUCTS) ×1 IMPLANT
GUIDEWIRE ANGLED .035X150CM (WIRE) ×1 IMPLANT
KIT ENCORE 26 ADVANTAGE (KITS) ×1 IMPLANT
KIT MICROPUNCTURE NIT STIFF (SHEATH) ×1 IMPLANT
KIT PV (KITS) ×3 IMPLANT
LUBRICANT VIPERSLIDE CORONARY (MISCELLANEOUS) ×1 IMPLANT
RESERVOIR CO2 (VASCULAR PRODUCTS) ×1 IMPLANT
SET FLUSH CO2 (MISCELLANEOUS) ×1 IMPLANT
SHEATH AVANTI 11CM 5FR (SHEATH) ×1 IMPLANT
SHEATH AVANTI 11CM 7FR (SHEATH) ×1 IMPLANT
SHEATH FLEXOR ANSEL 1 7F 45CM (SHEATH) ×1 IMPLANT
SYR MEDRAD MARK V 150ML (SYRINGE) ×3 IMPLANT
TRANSDUCER W/STOPCOCK (MISCELLANEOUS) ×3 IMPLANT
TRAY PV CATH (CUSTOM PROCEDURE TRAY) ×3 IMPLANT
WIRE BENTSON .035X145CM (WIRE) ×1 IMPLANT
WIRE G V18X300CM (WIRE) ×1 IMPLANT
WIRE SPARTACORE .014X300CM (WIRE) ×1 IMPLANT

## 2017-05-01 NOTE — Progress Notes (Signed)
Patient had extended bedrest due to stage 2 right groin with hematoma.  Bed rest extended 2 hours per Dr. Randie Heinzain.  Patient discharged in stable condtion to home with his daughter.

## 2017-05-01 NOTE — Progress Notes (Signed)
Manual pressure to rt groin 40 minutes. Hematoma is below and slightly  medial to  Dressing;   firm, bruised and raised w/o ridge. Remains firm after holding for 40 minutes. Rt pt dopplered. BP unchanged after I and O cath for 810cc yellow uop

## 2017-05-01 NOTE — Discharge Instructions (Signed)

## 2017-05-01 NOTE — Op Note (Signed)
Patient name: Paul Chen MRN: [id number] DOB: 1940-09-03 Sex: male  05/01/2017 Pre-operative Diagnosis: Critical bilateral lower extremity ischemia Post-operative diagnosis:  Same Surgeon:  Apolinar Junes C. Randie Heinz, MD Procedure Performed: 1.  Ultrasound-guided cannulation right common femoral artery 2.  CO2 aortogram with bilateral lower extremity angiogram with contrast 3.  Jetstream of left SFA 4.  Drug-coated balloon angioplasty left SFA with 6 mm x 250 mm in.pact Admiral 5.  Balloon angioplasty of anterior tibial artery on left with 3 mm balloon 6.  Moderate sedation with fentanyl and Versed for 84 minutes 7.  Minx device closure of right common femoral artery cannulation site   Indications: 77 year old male with history of diabetes and ulceration of his bilateral great toes now indicated for angiogram possible intervention.  He is scheduled for left great toe amputation on Thursday of this week.  Findings: The aorta and iliac segments appear free of disease.  On the left side there are multiple 60% stenoses in the SFA.  The anterior tibial artery has approximately a 70% stenosis.  Following treatment of the SFA there is 0% residual stenosis and no dissection.  The anterior tibial artery similarly has 0% residual stenosis and no dissection and there is three-vessel runoff to the foot.  On the right side there is an occluded SFA that is possibly at the takeoff.  He has three-vessel runoff to the foot on the right side.   Procedure:  The patient was identified in the holding area and taken to room 8.  The patient was then placed supine on the table and prepped and draped in the usual sterile fashion.  A time out was called.  Ultrasound was used to evaluate the right common femoral artery.  It was patent .  A digital ultrasound image was acquired.  A micropuncture needle was used to access the right common femoral artery under ultrasound guidance.  An 018 wire was advanced without resistance and a  micropuncture sheath was placed.  The 018 wire was removed and a benson wire was placed.  The micropuncture sheath was exchanged for a 5 french sheath.  An omniflush catheter was advanced over the wire to the level of L-1.  An abdominal angiogram was obtained with CO2.  Next, using a Glidewire and crossover catheter we were able to select the left common femoral artery and performed left lower extremity angiogram with the above findings.  We then crossed the SFA lesion with a V 18 wire and exchanged for a 014 wire and perform jetstream atherectomy.  We then performed balloon angioplasty with a 6 mm balloon and performed angiogram which showed no dissection and stenosis was resolved.  We then inflated a 6 mm x 250 mm drug-coated balloon to nominal pressure for 3 minutes.  Completion demonstrated no residual stenosis or dissection.  Satisfied we then used the 18 and CXI catheter to cross the tight stenosis in the anterior tibial artery and this was ballooned with a 3 mm balloon for 2 minutes.  Following this completion angiogram demonstrated no residual stenosis or dissection.  We completed a foot angiogram which showed flow into the foot via all 3 vessels.  Satisfied with this we then exchanged for a short 7 [place] sheath and perform right lower extremity angiogram which demonstrated an SFA occlusion with reconstitution of popliteal above the knee with three-vessel runoff to the foot.  Patient will need angiogram from left common femoral approach and possible retrograde right lower extremity approach.  This will be  scheduled in the coming weeks and he will need to hold his Coumadin for this.  He did tolerate this procedure well without immediate complication.  Next  Contrast:124cc   Katerina Zurn C. Randie Heinzain, MD [facility] [place] Office: [phone] Pager: [phone]

## 2017-05-01 NOTE — H&P (Signed)
   History and Physical Update  The patient was interviewed and re-examined.  The patient's previous History and Physical has been reviewed and is unchanged from recent office visit. [name] to amputate left great toe this Thursday. Will need angio with left leg intervention today and right one to follow in the coming weeks.    Paul Chen C. Randie Heinzain, MD [facility] [place] Office: [phone] Pager: [phone]  05/01/2017, 7:33 AM

## 2017-05-02 ENCOUNTER — Encounter (HOSPITAL_COMMUNITY): Payer: Self-pay | Admitting: Vascular Surgery

## 2017-05-02 ENCOUNTER — Other Ambulatory Visit: Payer: Self-pay | Admitting: *Deleted

## 2017-05-02 MED FILL — Lidocaine HCl Local Inj 1%: INTRAMUSCULAR | Qty: 20 | Status: AC

## 2017-05-02 MED FILL — Heparin Sodium (Porcine) 2 Unit/ML in Sodium Chloride 0.9%: INTRAMUSCULAR | Qty: 1000 | Status: AC

## 2017-05-04 ENCOUNTER — Ambulatory Visit (HOSPITAL_BASED_OUTPATIENT_CLINIC_OR_DEPARTMENT_OTHER)
Admission: RE | Admit: 2017-05-04 | Discharge: 2017-05-04 | Disposition: A | Discharge status: Home / Self Care | Payer: Medicare PPO | Source: Ambulatory Visit | Attending: Orthopedic Surgery | Admitting: Orthopedic Surgery

## 2017-05-04 ENCOUNTER — Ambulatory Visit (HOSPITAL_BASED_OUTPATIENT_CLINIC_OR_DEPARTMENT_OTHER): Payer: Medicare PPO | Admitting: Certified Registered"

## 2017-05-04 ENCOUNTER — Encounter (HOSPITAL_BASED_OUTPATIENT_CLINIC_OR_DEPARTMENT_OTHER): Payer: Self-pay | Admitting: *Deleted

## 2017-05-04 ENCOUNTER — Encounter (HOSPITAL_BASED_OUTPATIENT_CLINIC_OR_DEPARTMENT_OTHER)
Admission: RE | Disposition: A | Discharge status: Home / Self Care | Payer: Self-pay | Source: Ambulatory Visit | Attending: Orthopedic Surgery

## 2017-05-04 ENCOUNTER — Other Ambulatory Visit: Payer: Self-pay

## 2017-05-04 DIAGNOSIS — I272 Pulmonary hypertension, unspecified: Secondary | ICD-10-CM | POA: Diagnosis not present

## 2017-05-04 DIAGNOSIS — N183 Chronic kidney disease, stage 3 (moderate): Secondary | ICD-10-CM | POA: Insufficient documentation

## 2017-05-04 DIAGNOSIS — I2510 Atherosclerotic heart disease of native coronary artery without angina pectoris: Secondary | ICD-10-CM | POA: Insufficient documentation

## 2017-05-04 DIAGNOSIS — E1152 Type 2 diabetes mellitus with diabetic peripheral angiopathy with gangrene: Secondary | ICD-10-CM | POA: Insufficient documentation

## 2017-05-04 DIAGNOSIS — Z955 Presence of coronary angioplasty implant and graft: Secondary | ICD-10-CM | POA: Insufficient documentation

## 2017-05-04 DIAGNOSIS — Z86718 Personal history of other venous thrombosis and embolism: Secondary | ICD-10-CM | POA: Insufficient documentation

## 2017-05-04 DIAGNOSIS — I509 Heart failure, unspecified: Secondary | ICD-10-CM | POA: Diagnosis not present

## 2017-05-04 DIAGNOSIS — Z87891 Personal history of nicotine dependence: Secondary | ICD-10-CM | POA: Diagnosis not present

## 2017-05-04 DIAGNOSIS — G4733 Obstructive sleep apnea (adult) (pediatric): Secondary | ICD-10-CM | POA: Diagnosis not present

## 2017-05-04 DIAGNOSIS — Z8249 Family history of ischemic heart disease and other diseases of the circulatory system: Secondary | ICD-10-CM | POA: Diagnosis not present

## 2017-05-04 DIAGNOSIS — Z7902 Long term (current) use of antithrombotics/antiplatelets: Secondary | ICD-10-CM | POA: Diagnosis not present

## 2017-05-04 DIAGNOSIS — E1122 Type 2 diabetes mellitus with diabetic chronic kidney disease: Secondary | ICD-10-CM | POA: Insufficient documentation

## 2017-05-04 DIAGNOSIS — M868X7 Other osteomyelitis, ankle and foot: Secondary | ICD-10-CM | POA: Insufficient documentation

## 2017-05-04 DIAGNOSIS — Z7901 Long term (current) use of anticoagulants: Secondary | ICD-10-CM | POA: Insufficient documentation

## 2017-05-04 DIAGNOSIS — E1151 Type 2 diabetes mellitus with diabetic peripheral angiopathy without gangrene: Secondary | ICD-10-CM | POA: Insufficient documentation

## 2017-05-04 DIAGNOSIS — Z86711 Personal history of pulmonary embolism: Secondary | ICD-10-CM | POA: Diagnosis not present

## 2017-05-04 DIAGNOSIS — Z79899 Other long term (current) drug therapy: Secondary | ICD-10-CM | POA: Insufficient documentation

## 2017-05-04 DIAGNOSIS — Z794 Long term (current) use of insulin: Secondary | ICD-10-CM | POA: Diagnosis not present

## 2017-05-04 DIAGNOSIS — J449 Chronic obstructive pulmonary disease, unspecified: Secondary | ICD-10-CM | POA: Diagnosis not present

## 2017-05-04 DIAGNOSIS — Z6839 Body mass index (BMI) 39.0-39.9, adult: Secondary | ICD-10-CM | POA: Insufficient documentation

## 2017-05-04 DIAGNOSIS — L97524 Non-pressure chronic ulcer of other part of left foot with necrosis of bone: Secondary | ICD-10-CM

## 2017-05-04 DIAGNOSIS — I96 Gangrene, not elsewhere classified: Secondary | ICD-10-CM | POA: Diagnosis not present

## 2017-05-04 DIAGNOSIS — E1169 Type 2 diabetes mellitus with other specified complication: Secondary | ICD-10-CM | POA: Insufficient documentation

## 2017-05-04 DIAGNOSIS — E08621 Diabetes mellitus due to underlying condition with foot ulcer: Secondary | ICD-10-CM

## 2017-05-04 DIAGNOSIS — I130 Hypertensive heart and chronic kidney disease with heart failure and stage 1 through stage 4 chronic kidney disease, or unspecified chronic kidney disease: Secondary | ICD-10-CM | POA: Diagnosis not present

## 2017-05-04 DIAGNOSIS — E669 Obesity, unspecified: Secondary | ICD-10-CM | POA: Diagnosis not present

## 2017-05-04 HISTORY — PX: AMPUTATION TOE: SHX6595

## 2017-05-04 LAB — GLUCOSE, CAPILLARY
Glucose-Capillary: 105 mg/dL — ABNORMAL HIGH (ref 65–99)
Glucose-Capillary: 90 mg/dL (ref 65–99)

## 2017-05-04 SURGERY — AMPUTATION, TOE
Anesthesia: Monitor Anesthesia Care | Site: Toe | Laterality: Left

## 2017-05-04 MED ORDER — FENTANYL CITRATE (PF) 100 MCG/2ML IJ SOLN: 50.0000 ug | INTRAMUSCULAR | Status: DC | PRN

## 2017-05-04 MED ORDER — CEFAZOLIN SODIUM-DEXTROSE 1-4 GM/50ML-% IV SOLN
INTRAVENOUS | Status: AC
  Filled 2017-05-04: qty 50

## 2017-05-04 MED ORDER — LIDOCAINE HCL (CARDIAC) 20 MG/ML IV SOLN
INTRAVENOUS | Status: DC | PRN
  Administered 2017-05-04: 30 mg via INTRAVENOUS

## 2017-05-04 MED ORDER — MIDAZOLAM HCL 2 MG/2ML IJ SOLN: 1.0000 mg | INTRAMUSCULAR | Status: DC | PRN

## 2017-05-04 MED ORDER — SODIUM CHLORIDE 0.9 % IV SOLN: INTRAVENOUS | Status: DC

## 2017-05-04 MED ORDER — DEXTROSE 5 % IV SOLN: 3.0000 g | INTRAVENOUS | Status: DC

## 2017-05-04 MED ORDER — BUPIVACAINE-EPINEPHRINE (PF) 0.5% -1:200000 IJ SOLN
INTRAMUSCULAR | Status: AC
  Filled 2017-05-04: qty 30

## 2017-05-04 MED ORDER — PROPOFOL 500 MG/50ML IV EMUL
INTRAVENOUS | Status: DC | PRN
  Administered 2017-05-04: 35 ug/kg/min via INTRAVENOUS

## 2017-05-04 MED ORDER — LACTATED RINGERS IV SOLN
INTRAVENOUS | Status: DC
  Administered 2017-05-04: 14:00:00 via INTRAVENOUS

## 2017-05-04 MED ORDER — FENTANYL CITRATE (PF) 100 MCG/2ML IJ SOLN: 25.0000 ug | INTRAMUSCULAR | Status: DC | PRN

## 2017-05-04 MED ORDER — PROPOFOL 500 MG/50ML IV EMUL
INTRAVENOUS | Status: AC
  Filled 2017-05-04: qty 50

## 2017-05-04 MED ORDER — CEFAZOLIN SODIUM-DEXTROSE 2-4 GM/100ML-% IV SOLN
INTRAVENOUS | Status: AC
Start: 2017-05-04 — End: ?
  Filled 2017-05-04: qty 100

## 2017-05-04 MED ORDER — CHLORHEXIDINE GLUCONATE 4 % EX LIQD: 60.0000 mL | Freq: Once | CUTANEOUS | Status: DC

## 2017-05-04 MED ORDER — ONDANSETRON HCL 4 MG/2ML IJ SOLN: 4.0000 mg | Freq: Once | INTRAMUSCULAR | Status: DC | PRN

## 2017-05-04 MED ORDER — BUPIVACAINE-EPINEPHRINE 0.5% -1:200000 IJ SOLN
INTRAMUSCULAR | Status: DC | PRN
  Administered 2017-05-04: 10 mL

## 2017-05-04 MED ORDER — CEFAZOLIN SODIUM-DEXTROSE 2-4 GM/100ML-% IV SOLN
2.0000 g | INTRAVENOUS | Status: DC
  Administered 2017-05-04: 2 g via INTRAVENOUS

## 2017-05-04 MED ORDER — SCOPOLAMINE 1 MG/3DAYS TD PT72: 1.0000 | MEDICATED_PATCH | Freq: Once | TRANSDERMAL | Status: DC | PRN

## 2017-05-04 SURGICAL SUPPLY — 63 items
BLADE AVERAGE 25MMX9MM (BLADE)
BLADE AVERAGE 25X9 (BLADE) IMPLANT
BLADE OSC/SAG .038X5.5 CUT EDG (BLADE) IMPLANT
BLADE SURG 10 STRL SS (BLADE) IMPLANT
BLADE SURG 15 STRL LF DISP TIS (BLADE) ×1 IMPLANT
BLADE SURG 15 STRL SS (BLADE) ×6
BNDG CMPR 9X4 STRL LF SNTH (GAUZE/BANDAGES/DRESSINGS) ×1
BNDG COHESIVE 4X5 TAN STRL (GAUZE/BANDAGES/DRESSINGS) ×3 IMPLANT
BNDG CONFORM 3 STRL LF (GAUZE/BANDAGES/DRESSINGS) ×2 IMPLANT
BNDG ESMARK 4X9 LF (GAUZE/BANDAGES/DRESSINGS) ×3 IMPLANT
CHLORAPREP W/TINT 26ML (MISCELLANEOUS) ×3 IMPLANT
COVER BACK TABLE 60X90IN (DRAPES) ×3 IMPLANT
CUFF TOURNIQUET SINGLE 24IN (TOURNIQUET CUFF) IMPLANT
DECANTER SPIKE VIAL GLASS SM (MISCELLANEOUS) IMPLANT
DRAPE EXTREMITY T 121X128X90 (DRAPE) ×3 IMPLANT
DRAPE SURG 17X23 STRL (DRAPES) ×3 IMPLANT
DRAPE U-SHAPE 47X51 STRL (DRAPES) ×3 IMPLANT
DRSG EMULSION OIL 3X3 NADH (GAUZE/BANDAGES/DRESSINGS) IMPLANT
DRSG MEPITEL 4X7.2 (GAUZE/BANDAGES/DRESSINGS) ×3 IMPLANT
DRSG PAD ABDOMINAL 8X10 ST (GAUZE/BANDAGES/DRESSINGS) IMPLANT
ELECT REM PT RETURN 9FT ADLT (ELECTROSURGICAL) ×3
ELECTRODE REM PT RTRN 9FT ADLT (ELECTROSURGICAL) ×1 IMPLANT
GAUZE SPONGE 4X4 12PLY STRL (GAUZE/BANDAGES/DRESSINGS) ×3 IMPLANT
GLOVE BIO SURGEON STRL SZ8 (GLOVE) ×3 IMPLANT
GLOVE BIOGEL PI IND STRL 7.0 (GLOVE) IMPLANT
GLOVE BIOGEL PI IND STRL 8 (GLOVE) ×2 IMPLANT
GLOVE BIOGEL PI INDICATOR 7.0 (GLOVE) ×2
GLOVE BIOGEL PI INDICATOR 8 (GLOVE) ×2
GLOVE ECLIPSE 6.5 STRL STRAW (GLOVE) ×2 IMPLANT
GLOVE ECLIPSE 8.0 STRL XLNG CF (GLOVE) ×1 IMPLANT
GOWN STRL REUS W/ TWL LRG LVL3 (GOWN DISPOSABLE) ×1 IMPLANT
GOWN STRL REUS W/ TWL XL LVL3 (GOWN DISPOSABLE) ×2 IMPLANT
GOWN STRL REUS W/TWL LRG LVL3 (GOWN DISPOSABLE) ×3
GOWN STRL REUS W/TWL XL LVL3 (GOWN DISPOSABLE) ×6
NDL HYPO 25X1 1.5 SAFETY (NEEDLE) IMPLANT
NDL SAFETY ECLIPSE 18X1.5 (NEEDLE) IMPLANT
NEEDLE HYPO 18GX1.5 SHARP (NEEDLE)
NEEDLE HYPO 25X1 1.5 SAFETY (NEEDLE) IMPLANT
NS IRRIG 1000ML POUR BTL (IV SOLUTION) ×3 IMPLANT
PACK BASIN DAY SURGERY FS (CUSTOM PROCEDURE TRAY) ×3 IMPLANT
PAD CAST 4YDX4 CTTN HI CHSV (CAST SUPPLIES) ×1 IMPLANT
PADDING CAST COTTON 4X4 STRL (CAST SUPPLIES) ×3
PENCIL BUTTON HOLSTER BLD 10FT (ELECTRODE) ×3 IMPLANT
SANITIZER HAND PURELL 535ML FO (MISCELLANEOUS) ×3 IMPLANT
SHEET MEDIUM DRAPE 40X70 STRL (DRAPES) ×3 IMPLANT
SLEEVE SCD COMPRESS KNEE MED (MISCELLANEOUS) ×3 IMPLANT
SPONGE LAP 18X18 RF (DISPOSABLE) ×3 IMPLANT
STOCKINETTE 6  STRL (DRAPES) ×2
STOCKINETTE 6 STRL (DRAPES) ×1 IMPLANT
SUCTION FRAZIER HANDLE 10FR (MISCELLANEOUS)
SUCTION TUBE FRAZIER 10FR DISP (MISCELLANEOUS) IMPLANT
SUT ETHILON 2 0 FS 18 (SUTURE) ×2 IMPLANT
SUT ETHILON 2 0 FSLX (SUTURE) IMPLANT
SUT ETHILON 3 0 PS 1 (SUTURE) IMPLANT
SUT MNCRL AB 3-0 PS2 18 (SUTURE) IMPLANT
SWAB COLLECTION DEVICE MRSA (MISCELLANEOUS) IMPLANT
SWAB CULTURE ESWAB REG 1ML (MISCELLANEOUS) IMPLANT
SYR BULB 3OZ (MISCELLANEOUS) ×3 IMPLANT
SYR CONTROL 10ML LL (SYRINGE) ×2 IMPLANT
TOWEL OR 17X24 6PK STRL BLUE (TOWEL DISPOSABLE) ×3 IMPLANT
TUBE CONNECTING 20'X1/4 (TUBING)
TUBE CONNECTING 20X1/4 (TUBING) IMPLANT
UNDERPAD 30X30 (UNDERPADS AND DIAPERS) ×3 IMPLANT

## 2017-05-04 NOTE — Op Note (Signed)
05/04/2017  3:00 PM  PATIENT:  Paul Chen  77 y.o. male  PRE-OPERATIVE DIAGNOSIS:  Left hallux osteomyelitis  POST-OPERATIVE DIAGNOSIS:  Left hallux osteomyelitis  Procedure(s): Left hallux amputation at IP joint  SURGEON:  Wylene Simmer, MD  ASSISTANT: none  ANESTHESIA:   Local, MAC  EBL:  minimal   TOURNIQUET:  approx 10 min with ankle esmarch  COMPLICATIONS:  None apparent  DISPOSITION:  Extubated, awake and stable to recovery.  INDICATION FOR PROCEDURE: The patient is a 77 year old male with past medical history significant for diabetes and peripheral vascular disease.  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  He has failed nonoperative treatment to date including extensive wound care as well as revascularization.  He presents now for amputation of the left hallux.  He understands the risks and benefits of the alternative treatment options and elects surgical treatment.  .The risks and benefits of the alternative treatment options have been discussed in detail.  The patient wishes to proceed with surgery and specifically understands risks of bleeding, infection, nerve damage, blood clots, need for additional surgery, amputation and death.  PROCEDURE IN DETAIL:After pre operative consent was obtained, and the correct operative site was identified, the patient was brought to the operating room and placed supine on the OR table.  Anesthesia was administered.  Pre-operative antibiotics were administered.  A surgical timeout was taken.  The left lower extremity was prepped and draped in standard sterile fashion.  The foot was exsanguinated and a 4 inch Esmarch tourniquet wrapped around the ankle.  Local anesthetic was infiltrated into the subtenons tissues around the hallux as a digital block.  A fishmouth incision was marked on the skin adjacent to the IP joint.  The incision was made and dissection was carried down through the subcutaneous tissues to the level of  the periosteum.  Subperiosteal dissection was then carried to the level of the IP joint.  The toe was disarticulated and the distal portion passed off the field.  The neurovascular structures were all cauterized.  The wound was irrigated copiously.  The incision was closed with horizontal mattress sutures of 2-0 nylon.  Sterile dressings were applied followed by a compression wrap.  The tourniquet was released.  The patient was awakened from anesthesia and transported to the recovery room in stable condition.   FOLLOW UP PLAN: Weightbearing as tolerated in a flat postop shoe.  Follow-up in the office in 2 weeks for suture removal.

## 2017-05-04 NOTE — Discharge Instructions (Addendum)
Toni ArthursJohn Hewitt, MD [facility]  Please read the following information regarding your care after surgery.  Medications  You only need a prescription for the narcotic pain medicine (ex. oxycodone, Percocet, Norco).  All of the other medicines listed below are available over the counter. X acetominophen (Tylenol) 650 mg every 4-6 hours as you need for minor to moderate pain  Weight Bearing ? Bear weight when you are able on your operated leg or foot. X Bear weight only on your operated foot in the post-op shoe. ? Do not bear any weight on the operated leg or foot.  Cast / Splint / Dressing X Keep your splint, cast or dressing clean and dry.  Dont put anything (coat hanger, pencil, etc) down inside of it.  If it gets damp, use a hair dryer on the cool setting to dry it.  If it gets soaked, call the office to schedule an appointment for a cast change. ? Remove your dressing 3 days after surgery and cover the incisions with dry dressings.    After your dressing, cast or splint is removed; you may shower, but do not soak or scrub the wound.  Allow the water to run over it, and then gently pat it dry.  Swelling It is normal for you to have swelling where you had surgery.  To reduce swelling and pain, keep your toes above your nose for at least 3 days after surgery.  It may be necessary to keep your foot or leg elevated for several weeks.  If it hurts, it should be elevated.  Follow Up Call my office at [phone] when you are discharged from the hospital or surgery center to schedule an appointment to be seen two weeks after surgery.  Call my office at [phone] if you develop a fever >101.5 F, nausea, vomiting, bleeding from the surgical site or severe pain.      Post Anesthesia Home Care Instructions  Activity: Get plenty of rest for the remainder of the day. A responsible individual must stay with you for 24 hours following the procedure.  For the next 24 hours, DO  NOT: -Drive a car -Advertising copywriterperate machinery -Drink alcoholic beverages -Take any medication unless instructed by your physician -Make any legal decisions or sign important papers.  Meals: Start with liquid foods such as gelatin or soup. Progress to regular foods as tolerated. Avoid greasy, spicy, heavy foods. If nausea and/or vomiting occur, drink only clear liquids until the nausea and/or vomiting subsides. Call your physician if vomiting continues.  Special Instructions/Symptoms: Your throat may feel dry or sore from the anesthesia or the breathing tube placed in your throat during surgery. If this causes discomfort, gargle with warm salt water. The discomfort should disappear within 24 hours.  If you had a scopolamine patch placed behind your ear for the management of post- operative nausea and/or vomiting:  1. The medication in the patch is effective for 72 hours, after which it should be removed.  Wrap patch in a tissue and discard in the trash. Wash hands thoroughly with soap and water. 2. You may remove the patch earlier than 72 hours if you experience unpleasant side effects which may include dry mouth, dizziness or visual disturbances. 3. Avoid touching the patch. Wash your hands with soap and water after contact with the patch.

## 2017-05-04 NOTE — Anesthesia Procedure Notes (Signed)
Procedure Name: MAC Date/Time: 05/04/2017 2:19 PM Performed by: Signe Colt, CRNA Pre-anesthesia Checklist: Patient identified, Emergency Drugs available, Suction available, Patient being monitored and Timeout performed Patient Re-evaluated:Patient Re-evaluated prior to induction Oxygen Delivery Method: Simple face mask

## 2017-05-04 NOTE — Transfer of Care (Signed)
Immediate Anesthesia Transfer of Care Note  Patient: Paul Chen  Procedure(s) Performed: Left hallux amputation (Left Toe)  Patient Location: PACU  Anesthesia Type:MAC  Level of Consciousness: awake, alert , oriented and patient cooperative  Airway & Oxygen Therapy: Patient Spontanous Breathing and Patient connected to face mask oxygen  Post-op Assessment: Report given to RN and Post -op Vital signs reviewed and stable  Post vital signs: Reviewed and stable  Last Vitals:  Vitals Value Taken Time  BP    Temp    Pulse 64 05/04/2017  2:56 PM  Resp 12 05/04/2017  2:56 PM  SpO2 100 % 05/04/2017  2:56 PM  Vitals shown include unvalidated device data.  Last Pain:  Vitals:   05/04/17 1337  TempSrc: Oral  PainSc: 5       Patients Stated Pain Goal: 4 (59/29/24 4628)  Complications: No apparent anesthesia complications

## 2017-05-04 NOTE — H&P (Signed)
Paul Chen is an 77 y.o. male.   Chief Complaint: left hallux chronic diabetic ulcer HPI: The patient is a 77 year old male with a past medical history significant for diabetes and peripheral vascular disease.  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  He has failed nonoperative treatment to date including revascularization and extensive wound care.  He presents now for amputation of his left hallux.  Past Medical History:  Diagnosis Date  . Chronic renal insufficiency   . CKD (chronic kidney disease), stage III (HCC)   . COPD (chronic obstructive pulmonary disease) (HCC)    with restriction  . Coronary disease October 2010   Status post stenting of the proximal to mid right coronary with DES.  Status post extensive stenting of the proximal right coronary to the crux.  He has moderate disease in  the left coronary system  . Depression   . Diabetes mellitus    Insulin Dependent  . Diastolic dysfunction    Congestive heart failure with  . Dyspnea    multifactorial. chronic  . Gout   . Hypertension   . Hypoventilation    syndrome  . Obesity   . Pulmonary embolism [facility]) March 2010   hx of and DVT in March of 2010 on chronic Coumadin therapy  . Pulmonary hypertension (HCC)    Moderate to severe  . Retroperitoneal bleed    Hx of right pelvic  . Sleep apnea    Obstructive , no CPAP    Past Surgical History:  Procedure Laterality Date  . BACK SURGERY  2000   post  . CARDIOVASCULAR STRESS TEST  11-14-2008   EF 44%  . EXPLORATORY LAPAROTOMY  1972   POST  . LOWER EXTREMITY ANGIOGRAPHY N/A 05/01/2017   Procedure: LOWER EXTREMITY ANGIOGRAPHY;  Surgeon: Maeola Harman, MD;  Location: [facility] INVASIVE CV LAB;  Service: Cardiovascular;  Laterality: N/A;  . PERIPHERAL VASCULAR ATHERECTOMY  05/01/2017   Procedure: PERIPHERAL VASCULAR ATHERECTOMY;  Surgeon: Maeola Harman, MD;  Location[facility] INVASIVE CV LAB;  Service: Cardiovascular;;   Left SFA  . PERIPHERAL VASCULAR BALLOON ANGIOPLASTY  05/01/2017   Procedure: PERIPHERAL VASCULAR BALLOON ANGIOPLASTY;  Surgeon: Maeola Harman, MD;  Location: [facility] INVASIVE CV LAB;  Service: Cardiovascular;;  Left Anterial Tibial Artery  . US ECHOCARDIOGRAPHY  11-25-2008   EF 55-60%    Family History  Problem Relation Age of Onset  . Heart attack Mother   . Coronary artery disease Mother   . Hypertension Mother   . Coronary artery disease Father   . Heart attack Father    Social History:  reports that he quit smoking about 37 years ago. He has never used smokeless tobacco. He reports that he drinks alcohol. He reports that he does not use drugs.  Allergies: No Known Allergies  Medications Prior to Admission  Medication Sig Dispense Refill  . allopurinol (ZYLOPRIM) 300 MG tablet Take 300 mg by mouth daily.   11  . atorvastatin (LIPITOR) 40 MG tablet Take 1 tablet (40 mg total) by mouth daily. 90 tablet 3  . busPIRone (BUSPAR) 15 MG tablet Take 15 mg by mouth 2 (two) times daily.     . clopidogrel (PLAVIX) 75 MG tablet TAKE ONE TABLET BY MOUTH EVERY MORNING (Patient taking differently: TAKE ONE TABLET (75 MG) BY MOUTH EVERY MORNING) 30 tablet 11  . Coenzyme Q10 (COQ10) 100 MG CAPS Take 100 mg by mouth every evening.    Marland Kitchen  doxazosin (CARDURA) 4 MG tablet Take 4 mg by mouth every evening.     . DULoxetine (CYMBALTA) 60 MG capsule Take 60 mg by mouth daily.      Marland Kitchen. enoxaparin (LOVENOX) 120 MG/0.8ML injection Inject 120 mg into the skin.    Marland Kitchen. FLOVENT HFA 44 MCG/ACT inhaler Inhale 3 puffs into the lungs 2 (two) times daily.     . furosemide (LASIX) 20 MG tablet Take 40 mg by mouth 2 (two) times daily.   11  . ipratropium (ATROVENT) 0.02 % nebulizer solution Take 0.5 mg by nebulization 2 (two) times daily as needed for shortness of breath.     Marland Kitchen. LANTUS SOLOSTAR 100 UNIT/ML Solostar Pen Inject 30 Units into the skin 2 (two) times daily.   11  . lisinopril (PRINIVIL,ZESTRIL) 10 MG tablet  Take 10 mg by mouth daily.  3  . Magnesium Oxide 400 (240 Mg) MG TABS Take 400 mg by mouth 2 (two) times daily.  5  . metoprolol succinate (TOPROL-XL) 100 MG 24 hr tablet Take 150 mg by mouth daily.     Marland Kitchen. morphine (AVINZA) 120 MG 24 hr capsule Take 120 mg by mouth daily.     . Multiple Vitamins-Minerals (CENTRUM SILVER ULTRA MENS PO) Take 1 tablet by mouth daily.  99  . Omega-3 Fatty Acids (FISH OIL) 1000 MG CAPS Take 1 capsule by mouth 2 (two) times daily.    Marland Kitchen. oxyCODONE (ROXICODONE) 15 MG immediate release tablet Take 15 mg by mouth 6 (six) times daily.     . silver sulfADIAZINE (SILVADENE) 1 % cream Apply 1 application topically daily as needed (apply to toes).    Marland Kitchen. VICTOZA 18 MG/3ML SOPN Inject 1.8 mg into the skin daily.     Marland Kitchen. glucagon 1 MG injection Inject 1 mg into the vein once as needed (for low blood sugars.).     Marland Kitchen. nitroGLYCERIN (NITROSTAT) 0.4 MG SL tablet Place 0.4 mg under the tongue every 5 (five) minutes as needed for chest pain.     Marland Kitchen. warfarin (COUMADIN) 2.5 MG tablet Take 2.5 mg by mouth every Tuesday, Thursday, Saturday, and Sunday at 6 PM. Take 1 tab by mouth Tuesdays and Fridays   5  . warfarin (COUMADIN) 3 MG tablet Take 3 mg by mouth every Monday, Wednesday, and Friday at 6 PM.       No results found for this or any previous visit (from the past 48 hour(s)). No results found.  ROS no recent fever, chills, nausea, vomiting or changes in his appetite  Blood pressure (!) 164/47, pulse 69, temperature 98.4 F (36.9 C), temperature source Oral, resp. rate 20, height 5\' 9"  (1.753 m), weight 122 kg (269 lb), SpO2 97 %. Physical Exam  Well-nourished well-developed male in no apparent distress.  Alert and oriented x4.  Mood and affect are normal.  Extraocular motions are intact.  Respirations are unlabored.  Gait is wide-based and shuffling bilaterally.  No antalgia.  The left foot has an ulcer at the tip of the toe with necrosis and exposed bone.  He has brisk capillary refill  at the toes.  5 out of 5 strength in plantar flexion and dorsiflexion of the ankle and toes.  No lymphadenopathy.  Sensibility to light touch at the forefoot is diminished.  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  The  patient wishes to proceed with surgery and specifically understands risks of bleeding, infection, nerve damage, blood clots, need for additional surgery, amputation and death.   Toni Arthurs, MD 05-15-17, 1:40 PM

## 2017-05-04 NOTE — Anesthesia Postprocedure Evaluation (Signed)
Anesthesia Post Note  Patient: Paul Chen  Procedure(s) Performed: Left hallux amputation (Left Toe)     Patient location during evaluation: PACU Anesthesia Type: MAC Level of consciousness: awake and alert Pain management: pain level controlled Vital Signs Assessment: post-procedure vital signs reviewed and stable Respiratory status: spontaneous breathing, nonlabored ventilation and respiratory function stable Cardiovascular status: stable and blood pressure returned to baseline Postop Assessment: no apparent nausea or vomiting Anesthetic complications: no    Last Vitals:  Vitals:   05/04/17 1527 05/04/17 1600  BP: (!) 141/51 (!) 170/64  Pulse: 60 61  Resp: 11 16  Temp:  36.9 C  SpO2: 94% 98%    Last Pain:  Vitals:   05/04/17 1600  TempSrc:   PainSc: 0-No pain                 Catalina Gravel

## 2017-05-04 NOTE — Anesthesia Preprocedure Evaluation (Signed)
Anesthesia Evaluation  Patient identified by MRN, date of birth, ID band Patient awake    Reviewed: Allergy & Precautions, NPO status , Patient's Chart, lab work & pertinent test results, reviewed documented beta blocker date and time   Airway Mallampati: II  TM Distance: >3 FB Neck ROM: Full    Dental  (+) Teeth Intact, Dental Advisory Given   Pulmonary shortness of breath, sleep apnea , COPD,  COPD inhaler, former smoker,    Pulmonary exam normal breath sounds clear to auscultation       Cardiovascular hypertension, Pt. on home beta blockers + CAD, + Cardiac Stents, + Peripheral Vascular Disease, +CHF and + DVT  Normal cardiovascular exam Rhythm:Regular Rate:Normal  Mod-severe pulmonary hypertension  Myoview 07/2016: Nuclear stress EF: 52%. Small inferoapical defect that improves during recovery. Overall does not appear to represent significant ischemia. May represent soft tissue attenuation Ovreall low risk scan   Neuro/Psych negative neurological ROS     GI/Hepatic negative GI ROS, Neg liver ROS,   Endo/Other  diabetes, Type 2, Insulin DependentObesity   Renal/GU Renal InsufficiencyRenal disease     Musculoskeletal negative musculoskeletal ROS (+)   Abdominal   Peds  Hematology  (+) Blood dyscrasia (Plavix), anemia ,   Anesthesia Other Findings Day of surgery medications reviewed with the patient.  Reproductive/Obstetrics                             Anesthesia Physical Anesthesia Plan  ASA: IV  Anesthesia Plan: MAC   Post-op Pain Management:    Induction: Intravenous  PONV Risk Score and Plan: 1 and Propofol infusion and Treatment may vary due to age or medical condition  Airway Management Planned: Nasal Cannula  Additional Equipment:   Intra-op Plan:   Post-operative Plan:   Informed Consent: I have reviewed the patients History and Physical, chart, labs and  discussed the procedure including the risks, benefits and alternatives for the proposed anesthesia with the patient or authorized representative who has indicated his/her understanding and acceptance.   Dental advisory given  Plan Discussed with: CRNA and Anesthesiologist  Anesthesia Plan Comments: (Discussed risks/benefits/alternatives to MAC sedation including need for ventilatory support, hypotension, need for conversion to general anesthesia.  All patient questions answered.  Patient/guardian wishes to proceed.)        Anesthesia Quick Evaluation

## 2017-05-05 ENCOUNTER — Encounter (HOSPITAL_BASED_OUTPATIENT_CLINIC_OR_DEPARTMENT_OTHER): Payer: Self-pay | Admitting: Orthopedic Surgery

## 2017-05-10 ENCOUNTER — Encounter (HOSPITAL_COMMUNITY): Admission: RE | Payer: Self-pay | Source: Ambulatory Visit

## 2017-05-10 ENCOUNTER — Ambulatory Visit (HOSPITAL_COMMUNITY): Admission: RE | Admit: 2017-05-10 | Payer: Medicare PPO | Source: Ambulatory Visit | Admitting: Vascular Surgery

## 2017-05-10 ENCOUNTER — Other Ambulatory Visit: Payer: Self-pay | Admitting: *Deleted

## 2017-05-10 SURGERY — LOWER EXTREMITY ANGIOGRAPHY
Anesthesia: LOCAL

## 2017-05-12 ENCOUNTER — Telehealth: Payer: Self-pay | Admitting: Vascular Surgery

## 2017-05-12 NOTE — Telephone Encounter (Signed)
Raynelle FanningJulie from [facility] called to inquire about the patient's warfarin dosage and instructions prior to his procedure scheduled for Monday 05/15/17 with Dr.Cain.  I forwarded her phone call to [facility] LtdBecky. awt

## 2017-05-15 ENCOUNTER — Encounter (HOSPITAL_COMMUNITY): Payer: Self-pay | Admitting: Vascular Surgery

## 2017-05-15 ENCOUNTER — Encounter (HOSPITAL_COMMUNITY)
Admission: RE | Disposition: A | Discharge status: Home / Self Care | Payer: Self-pay | Source: Ambulatory Visit | Attending: Vascular Surgery

## 2017-05-15 ENCOUNTER — Ambulatory Visit (HOSPITAL_COMMUNITY)
Admission: RE | Admit: 2017-05-15 | Discharge: 2017-05-15 | Disposition: A | Discharge status: Home / Self Care | Payer: Medicare PPO | Source: Ambulatory Visit | Attending: Vascular Surgery | Admitting: Vascular Surgery

## 2017-05-15 DIAGNOSIS — Z8249 Family history of ischemic heart disease and other diseases of the circulatory system: Secondary | ICD-10-CM | POA: Insufficient documentation

## 2017-05-15 DIAGNOSIS — N183 Chronic kidney disease, stage 3 (moderate): Secondary | ICD-10-CM | POA: Insufficient documentation

## 2017-05-15 DIAGNOSIS — I7025 Atherosclerosis of native arteries of other extremities with ulceration: Secondary | ICD-10-CM | POA: Diagnosis not present

## 2017-05-15 DIAGNOSIS — Z7982 Long term (current) use of aspirin: Secondary | ICD-10-CM | POA: Diagnosis not present

## 2017-05-15 DIAGNOSIS — Z794 Long term (current) use of insulin: Secondary | ICD-10-CM | POA: Diagnosis not present

## 2017-05-15 DIAGNOSIS — I5030 Unspecified diastolic (congestive) heart failure: Secondary | ICD-10-CM | POA: Diagnosis not present

## 2017-05-15 DIAGNOSIS — L97521 Non-pressure chronic ulcer of other part of left foot limited to breakdown of skin: Secondary | ICD-10-CM | POA: Insufficient documentation

## 2017-05-15 DIAGNOSIS — Z6838 Body mass index (BMI) 38.0-38.9, adult: Secondary | ICD-10-CM | POA: Diagnosis not present

## 2017-05-15 DIAGNOSIS — L97511 Non-pressure chronic ulcer of other part of right foot limited to breakdown of skin: Secondary | ICD-10-CM | POA: Diagnosis not present

## 2017-05-15 DIAGNOSIS — Z87891 Personal history of nicotine dependence: Secondary | ICD-10-CM | POA: Insufficient documentation

## 2017-05-15 DIAGNOSIS — Z79899 Other long term (current) drug therapy: Secondary | ICD-10-CM | POA: Insufficient documentation

## 2017-05-15 DIAGNOSIS — E1122 Type 2 diabetes mellitus with diabetic chronic kidney disease: Secondary | ICD-10-CM | POA: Insufficient documentation

## 2017-05-15 DIAGNOSIS — G4733 Obstructive sleep apnea (adult) (pediatric): Secondary | ICD-10-CM | POA: Insufficient documentation

## 2017-05-15 DIAGNOSIS — E669 Obesity, unspecified: Secondary | ICD-10-CM | POA: Insufficient documentation

## 2017-05-15 DIAGNOSIS — E1151 Type 2 diabetes mellitus with diabetic peripheral angiopathy without gangrene: Secondary | ICD-10-CM | POA: Diagnosis not present

## 2017-05-15 DIAGNOSIS — I272 Pulmonary hypertension, unspecified: Secondary | ICD-10-CM | POA: Diagnosis not present

## 2017-05-15 DIAGNOSIS — Z7902 Long term (current) use of antithrombotics/antiplatelets: Secondary | ICD-10-CM | POA: Insufficient documentation

## 2017-05-15 DIAGNOSIS — E11621 Type 2 diabetes mellitus with foot ulcer: Secondary | ICD-10-CM | POA: Diagnosis not present

## 2017-05-15 DIAGNOSIS — I998 Other disorder of circulatory system: Secondary | ICD-10-CM | POA: Insufficient documentation

## 2017-05-15 DIAGNOSIS — Z86711 Personal history of pulmonary embolism: Secondary | ICD-10-CM | POA: Insufficient documentation

## 2017-05-15 DIAGNOSIS — I130 Hypertensive heart and chronic kidney disease with heart failure and stage 1 through stage 4 chronic kidney disease, or unspecified chronic kidney disease: Secondary | ICD-10-CM | POA: Diagnosis not present

## 2017-05-15 DIAGNOSIS — J449 Chronic obstructive pulmonary disease, unspecified: Secondary | ICD-10-CM | POA: Insufficient documentation

## 2017-05-15 DIAGNOSIS — Z9889 Other specified postprocedural states: Secondary | ICD-10-CM | POA: Insufficient documentation

## 2017-05-15 HISTORY — PX: PERIPHERAL VASCULAR INTERVENTION: CATH118257

## 2017-05-15 HISTORY — PX: LOWER EXTREMITY ANGIOGRAPHY: CATH118251

## 2017-05-15 LAB — GLUCOSE, CAPILLARY
Glucose-Capillary: 65 mg/dL (ref 65–99)
Glucose-Capillary: 26 mg/dL — CL (ref 65–99)
Glucose-Capillary: 81 mg/dL (ref 65–99)

## 2017-05-15 LAB — POCT I-STAT, CHEM 8
BUN: 14 mg/dL (ref 6–20)
Calcium, Ion: 1.11 mmol/L — ABNORMAL LOW (ref 1.15–1.40)
Chloride: 100 mmol/L — ABNORMAL LOW (ref 101–111)
Creatinine, Ser: 1.1 mg/dL (ref 0.61–1.24)
Glucose, Bld: 55 mg/dL — ABNORMAL LOW (ref 65–99)
HCT: 36 % — ABNORMAL LOW (ref 39.0–52.0)
Hemoglobin: 12.2 g/dL — ABNORMAL LOW (ref 13.0–17.0)
Potassium: 3.6 mmol/L (ref 3.5–5.1)
Sodium: 143 mmol/L (ref 135–145)
TCO2: 32 mmol/L (ref 22–32)

## 2017-05-15 LAB — PROTIME-INR
INR: 1.09
Prothrombin Time: 14 seconds (ref 11.4–15.2)

## 2017-05-15 SURGERY — LOWER EXTREMITY ANGIOGRAPHY
Anesthesia: LOCAL | Laterality: Right

## 2017-05-15 MED ORDER — SODIUM CHLORIDE 0.9% FLUSH: 3.0000 mL | Freq: Two times a day (BID) | INTRAVENOUS | Status: DC

## 2017-05-15 MED ORDER — LABETALOL HCL 5 MG/ML IV SOLN
INTRAVENOUS | Status: AC
  Filled 2017-05-15: qty 4

## 2017-05-15 MED ORDER — DEXTROSE 50 % IV SOLN
INTRAVENOUS | Status: AC
  Filled 2017-05-15: qty 50

## 2017-05-15 MED ORDER — SODIUM CHLORIDE 0.9% FLUSH: 3.0000 mL | INTRAVENOUS | Status: DC | PRN

## 2017-05-15 MED ORDER — SODIUM CHLORIDE 0.9 % WEIGHT BASED INFUSION: 1.0000 mL/kg/h | INTRAVENOUS | Status: DC

## 2017-05-15 MED ORDER — SODIUM CHLORIDE 0.9 % IV SOLN
INTRAVENOUS | Status: DC
  Administered 2017-05-15: 08:00:00 via INTRAVENOUS

## 2017-05-15 MED ORDER — HEPARIN SODIUM (PORCINE) 1000 UNIT/ML IJ SOLN
INTRAMUSCULAR | Status: DC | PRN
  Administered 2017-05-15: 12000 [IU] via INTRAVENOUS

## 2017-05-15 MED ORDER — LIDOCAINE HCL (PF) 1 % IJ SOLN
INTRAMUSCULAR | Status: AC
  Filled 2017-05-15: qty 30

## 2017-05-15 MED ORDER — ONDANSETRON HCL 4 MG/2ML IJ SOLN: 4.0000 mg | Freq: Four times a day (QID) | INTRAMUSCULAR | Status: DC | PRN

## 2017-05-15 MED ORDER — FENTANYL CITRATE (PF) 100 MCG/2ML IJ SOLN
INTRAMUSCULAR | Status: AC
  Filled 2017-05-15: qty 2

## 2017-05-15 MED ORDER — HEPARIN (PORCINE) IN NACL 2-0.9 UNIT/ML-% IJ SOLN
INTRAMUSCULAR | Status: AC | PRN
  Administered 2017-05-15 (×2): 500 mL via INTRA_ARTERIAL

## 2017-05-15 MED ORDER — LABETALOL HCL 5 MG/ML IV SOLN: 10.0000 mg | INTRAVENOUS | Status: DC | PRN

## 2017-05-15 MED ORDER — HEPARIN SODIUM (PORCINE) 1000 UNIT/ML IJ SOLN
INTRAMUSCULAR | Status: AC
  Filled 2017-05-15: qty 2

## 2017-05-15 MED ORDER — LIDOCAINE HCL (PF) 1 % IJ SOLN
INTRAMUSCULAR | Status: DC | PRN
  Administered 2017-05-15: 8 mL
  Administered 2017-05-15: 12 mL

## 2017-05-15 MED ORDER — ALBUTEROL SULFATE (2.5 MG/3ML) 0.083% IN NEBU
2.5000 mg | INHALATION_SOLUTION | RESPIRATORY_TRACT | Status: DC
  Filled 2017-05-15: qty 3

## 2017-05-15 MED ORDER — DEXTROSE 50 % IV SOLN
25.0000 mL | Freq: Once | INTRAVENOUS | Status: AC
  Administered 2017-05-15: 25 mL via INTRAVENOUS

## 2017-05-15 MED ORDER — ACETAMINOPHEN 325 MG PO TABS: 650.0000 mg | ORAL_TABLET | ORAL | Status: DC | PRN

## 2017-05-15 MED ORDER — SODIUM CHLORIDE 0.9 % IV SOLN: 250.0000 mL | INTRAVENOUS | Status: DC | PRN

## 2017-05-15 MED ORDER — MIDAZOLAM HCL 2 MG/2ML IJ SOLN
INTRAMUSCULAR | Status: DC | PRN
  Administered 2017-05-15: 1 mg via INTRAVENOUS

## 2017-05-15 MED ORDER — HYDRALAZINE HCL 20 MG/ML IJ SOLN: 5.0000 mg | INTRAMUSCULAR | Status: DC | PRN

## 2017-05-15 MED ORDER — FENTANYL CITRATE (PF) 100 MCG/2ML IJ SOLN
INTRAMUSCULAR | Status: DC | PRN
  Administered 2017-05-15 (×2): 50 ug via INTRAVENOUS

## 2017-05-15 MED ORDER — DEXTROSE 50 % IV SOLN
INTRAVENOUS | Status: DC | PRN
  Administered 2017-05-15: 1 via INTRAVENOUS

## 2017-05-15 MED ORDER — IODIXANOL 320 MG/ML IV SOLN
INTRAVENOUS | Status: DC | PRN
  Administered 2017-05-15: 75 mL via INTRAVENOUS

## 2017-05-15 MED ORDER — DEXTROSE 50 % IV SOLN
INTRAVENOUS | Status: AC
  Administered 2017-05-15: 25 mL
  Filled 2017-05-15: qty 50

## 2017-05-15 MED ORDER — LABETALOL HCL 5 MG/ML IV SOLN
INTRAVENOUS | Status: DC | PRN
  Administered 2017-05-15 (×2): 10 mg via INTRAVENOUS

## 2017-05-15 MED ORDER — IPRATROPIUM-ALBUTEROL 0.5-2.5 (3) MG/3ML IN SOLN
3.0000 mL | RESPIRATORY_TRACT | Status: AC
  Administered 2017-05-15: 3 mL via RESPIRATORY_TRACT
  Filled 2017-05-15: qty 3

## 2017-05-15 MED ORDER — MIDAZOLAM HCL 2 MG/2ML IJ SOLN
INTRAMUSCULAR | Status: AC
  Filled 2017-05-15: qty 2

## 2017-05-15 SURGICAL SUPPLY — 27 items
BALLN MUSTANG 5X150X135 (BALLOONS) ×3
BALLOON MUSTANG 5X150X135 (BALLOONS) IMPLANT
CATH QUICKCROSS SUPP .018X90CM (MICROCATHETER) IMPLANT
CATH QUICKCROSS SUPP .035X90CM (MICROCATHETER) ×1 IMPLANT
CATH TEMPO 5F RIM 65CM (CATHETERS) ×1 IMPLANT
COVER PRB 48X5XTLSCP FOLD TPE (BAG) IMPLANT
COVER PROBE 5X48 (BAG) ×3
DEVICE CLOSURE MYNXGRIP 6/7F (Vascular Products) ×1 IMPLANT
DEVICE ONE SNARE 10MM (MISCELLANEOUS) ×1 IMPLANT
DEVICE TORQUE .025-.038 (MISCELLANEOUS) ×1 IMPLANT
GUIDEWIRE ANGLED .035X150CM (WIRE) ×1 IMPLANT
GUIDEWIRE ANGLED .035X260CM (WIRE) ×1 IMPLANT
HEMOSTASIS PAD V PLUS (HEMOSTASIS) ×1 IMPLANT
HOVERMATT SINGLE USE (MISCELLANEOUS) ×1 IMPLANT
KIT MICROPUNCTURE NIT STIFF (SHEATH) ×1 IMPLANT
KIT PV (KITS) ×3 IMPLANT
NDL PERC 21GX4CM (NEEDLE) IMPLANT
NEEDLE PERC 21GX4CM (NEEDLE) ×3 IMPLANT
SHEATH AVANTI 11CM 5FR (SHEATH) ×1 IMPLANT
SHEATH AVANTI 11CM 7FR (SHEATH) ×1 IMPLANT
SHEATH FLEXOR ANSEL 1 7F 45CM (SHEATH) ×1 IMPLANT
STENT INNOVA 6X150X130 (Permanent Stent) ×2 IMPLANT
STENT ZILVER PTX 6X120 (Permanent Stent) ×1 IMPLANT
SYR MEDRAD MARK V 150ML (SYRINGE) ×3 IMPLANT
TRANSDUCER W/STOPCOCK (MISCELLANEOUS) ×3 IMPLANT
TRAY PV CATH (CUSTOM PROCEDURE TRAY) ×3 IMPLANT
WIRE BENTSON .035X145CM (WIRE) ×1 IMPLANT

## 2017-05-15 NOTE — H&P (Signed)
   History and Physical Update  The patient was interviewed and re-examined.  The patient's previous History and Physical has been reviewed and is unchanged from recent evaluation. Angiogram of right lower extremity with possible retrograde access.  Paul Villasenor C. Randie Heinzain, MD [facility] Office: [phone] Pager: [phone]   05/15/2017, 7:31 AM

## 2017-05-15 NOTE — Progress Notes (Signed)
Pt CBG now 65, another 25 ml of D50 given.

## 2017-05-15 NOTE — Op Note (Signed)
Patient name: Paul Chen MRN: [id number] DOB: Dec 06, 1940 Sex: male  05/15/2017 Pre-operative Diagnosis: Critical right lower extremity ischemia with great toe ulceration Post-operative diagnosis:  Same Surgeon:  Apolinar Junes C. Randie Heinz, MD Procedure Performed: 1.  Ultrasound guided cannulation left common femoral artery 2.  Right lower extremity angiogram 3.  Ultrasound-guided cannulation right anterior tibial artery 4.  Stenting of right SFA with 6 x 150 mm innova x2 and Cook Zilver PTX 6 x 120 in the proximal SFA 5.  Moderate sedation with fentanyl and Versed 89 minutes 6.  Percutaneous closure left common femoral artery with minx device  Indications: 77 year old male has undergone intervention of his left SFA and anterior tibial arteries and a left partial great toe amputation.  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  He is now indicated for retrograde access and intervention of the right SFA  Findings: There is a flush occlusion of the right SFA.  There is three-vessel runoff to the level of the foot on the right.  We were able to cross and retrograde fashion and snare.  Patient did have desaturations requiring respiratory therapy during the procedure.  After balloon dilatation of the SFA there was significant dissection for which we elected to stent primarily.  At completion there was no residual stenosis no flow-limiting dissection   Procedure:  The patient was identified in the holding area and taken to room 8.  The patient was then placed supine on the table and prepped and draped in the usual sterile fashion.  A time out was called.  Ultrasound was used to evaluate the left common femoral artery.  It was patent .  A digital ultrasound image was acquired.  A micropuncture needle was used to access the left common femoral artery under ultrasound guidance.  An 018 wire was advanced without resistance and a micropuncture sheath was placed.   The 018 wire was removed and a benson wire was placed.  The micropuncture sheath was exchanged for a 5 french sheath.  We went directly up and over the bifurcation with rim catheter and performed angiogram from the right common femoral artery that demonstrated our flush occlusion of her right SFA.  We then used ultrasound guidance to cannulate the right anterior tibial artery with micropuncture needle and placed a micropuncture sheath.  This time the patient was given 12,000 units of heparin.  In the groin we exchanged for a long 7 French sheath into the right common femoral artery.  After heparin had circulated we then used a quick cross catheter and Glidewire to cross the occluded right SFA from below get into the common femoral artery we then able to snare and obtain through and through access.  We then balloon dilated the right SFA with 5 mm balloon.  Carlyon Prows demonstrated dissection throughout.  We then elected to stent starting with 5 x 150 bare-metal stents distally x2 and then placed a drug eluting stent proximally at the takeoff of the SFA.  We then dilated this with a 5 mm balloon.  While the balloon was inflated we retracted our wire back into the balloon and turn it around antegrade.  We removed the sheath from the anterior tibial artery and pressure was held.  Following balloon dilatation of all of our stents angiogram demonstrated runoff without residual stenosis or dissection in the SFA and the posterior tibial was noted to be the dominant runoff.  We then exchanged  for a short 7 [place] sheath over a Bentson wire and deployed a minx device.  He did tolerate all this well without immediate complication.  At completion we evaluated both feet with Doppler demonstrated a strong dorsalis pedis on the left and a strong posterior tibial on the right although pressure was being held on the anterior tibial at the time this should have a strong signal as well.  Contrast:  75cc   Garris Melhorn C. Randie Heinzain,  MD [facility] [place] Office: [phone] Pager: [phone]

## 2017-05-15 NOTE — Discharge Instructions (Signed)

## 2017-05-15 NOTE — Progress Notes (Signed)
Per Dr Randie Heinzain client may resume coumadin tonight

## 2017-05-15 NOTE — Progress Notes (Addendum)
Patient blood sugar per Istat is 55. To treat with D50 per protocol.  Notified Richardean CanalJennifer Oneal RN.

## 2017-05-16 ENCOUNTER — Telehealth: Payer: Self-pay | Admitting: Vascular Surgery

## 2017-05-16 ENCOUNTER — Other Ambulatory Visit: Payer: Self-pay

## 2017-05-16 DIAGNOSIS — Z48812 Encounter for surgical aftercare following surgery on the circulatory system: Secondary | ICD-10-CM

## 2017-05-16 DIAGNOSIS — L98499 Non-pressure chronic ulcer of skin of other sites with unspecified severity: Secondary | ICD-10-CM

## 2017-05-16 DIAGNOSIS — I7025 Atherosclerosis of native arteries of other extremities with ulceration: Secondary | ICD-10-CM

## 2017-05-16 NOTE — Telephone Encounter (Signed)
Spoke to family member for appt 5/10 US x2 and OV   Mld lttr

## 2017-05-16 NOTE — Telephone Encounter (Signed)
-----   Message from Sharee PimpleMarilyn K McChesney, RN sent at 05/15/2017 12:07 PM EDT ----- Regarding: 4-5 weeks w/ 2 labs   ----- Message ----- From: Maeola Harmanain, Brandon Christopher, MD Sent: 05/15/2017  11:29 AM To: [address]  Michele RockersDanny L Spinnato [id number] 1940-02-14  05/15/2017 Pre-operative Diagnosis: Critical right lower extremity ischemia with great toe ulceration  Surgeon:  Apolinar JunesBrandon C. Randie Heinzain, MD  Procedure Performed: 1.  Ultrasound guided cannulation left common femoral artery 2.  Right lower extremity angiogram 3.  Ultrasound-guided cannulation right anterior tibial artery 4.  Stenting of right SFA with 6 x 150 mm innova x2 and Cook Zilver PTX 6 x 120 in the proximal SFA 5.  Moderate sedation with fentanyl and Versed 89 minutes 6.  Percutaneous closure left common femoral artery with minx device  F/u in 4-5 weeks with ble duplex and abi

## 2017-06-09 ENCOUNTER — Encounter: Payer: Self-pay | Admitting: Vascular Surgery

## 2017-06-09 ENCOUNTER — Other Ambulatory Visit: Payer: Self-pay

## 2017-06-09 ENCOUNTER — Ambulatory Visit (INDEPENDENT_AMBULATORY_CARE_PROVIDER_SITE_OTHER): Payer: Medicare PPO | Admitting: Vascular Surgery

## 2017-06-09 ENCOUNTER — Ambulatory Visit (HOSPITAL_COMMUNITY)
Admission: RE | Admit: 2017-06-09 | Discharge: 2017-06-09 | Disposition: A | Discharge status: Home / Self Care | Payer: Medicare PPO | Source: Ambulatory Visit | Attending: Vascular Surgery | Admitting: Vascular Surgery

## 2017-06-09 VITALS — BP 186/72 | HR 64 | Temp 97.7°F | Resp 20 | Ht 70.0 in | Wt 258.0 lb

## 2017-06-09 DIAGNOSIS — Z48812 Encounter for surgical aftercare following surgery on the circulatory system: Secondary | ICD-10-CM

## 2017-06-09 DIAGNOSIS — I7025 Atherosclerosis of native arteries of other extremities with ulceration: Secondary | ICD-10-CM

## 2017-06-09 DIAGNOSIS — L98499 Non-pressure chronic ulcer of skin of other sites with unspecified severity: Secondary | ICD-10-CM

## 2017-06-09 DIAGNOSIS — R0989 Other specified symptoms and signs involving the circulatory and respiratory systems: Secondary | ICD-10-CM | POA: Diagnosis present

## 2017-06-09 DIAGNOSIS — I70203 Unspecified atherosclerosis of native arteries of extremities, bilateral legs: Secondary | ICD-10-CM | POA: Diagnosis not present

## 2017-06-09 NOTE — Progress Notes (Signed)
Patient ID: Paul Chen, male   DOB: Jun 18, 1940, 77 y.o.   MRN: [id number]  Reason for Consult: Follow-up (4-5 wks f/u with BLE Art, ABI. )   Referred by Carmin Richmond, MD  Subjective:     HPI:  Paul Chen is a 77 y.o. male with history of bilateral toe ulceration is undergone partial left great toe amputation has a wound persistent on the right.  He is undergone bilateral SFA interventions by me both done in April on separate settings.  He is now healed his left great toe partial amputation.  He is on Plavix as well as Coumadin.  He does have swelling of his bilateral lower extremity with skin irritation.  He is not wearing compression stockings does not elevate his legs when he is recumbent.  Past Medical History:  Diagnosis Date  . Chronic renal insufficiency   . CKD (chronic kidney disease), stage III (HCC)   . COPD (chronic obstructive pulmonary disease) (HCC)    with restriction  . Coronary disease October 2010   Status post stenting of the proximal to mid right coronary with DES.  Status post extensive stenting of the proximal right coronary to the crux.  He has moderate disease in  the left coronary system  . Depression   . Diabetes mellitus    Insulin Dependent  . Diastolic dysfunction    Congestive heart failure with  . Dyspnea    multifactorial. chronic  . Gout   . Hypertension   . Hypoventilation    syndrome  . Obesity   . Pulmonary embolism [facility]) March 2010   hx of and DVT in March of 2010 on chronic Coumadin therapy  . Pulmonary hypertension (HCC)    Moderate to severe  . Retroperitoneal bleed    Hx of right pelvic  . Sleep apnea    Obstructive , no CPAP   Family History  Problem Relation Age of Onset  . Heart attack Mother   . Coronary artery disease Mother   . Hypertension Mother   . Coronary artery disease Father   . Heart attack Father    Past Surgical History:  Procedure Laterality Date  . AMPUTATION TOE Left 05/04/2017   Procedure: Left  hallux amputation;  Surgeon: Toni Arthurs, MD;  Location: [facility];  Service: Orthopedics;  Laterality: Left;  . BACK SURGERY  2000   post  . CARDIOVASCULAR STRESS TEST  11-14-2008   EF 44%  . EXPLORATORY LAPAROTOMY  1972   POST  . LOWER EXTREMITY ANGIOGRAPHY N/A 05/01/2017   Procedure: LOWER EXTREMITY ANGIOGRAPHY;  Surgeon: Maeola Harman, MD;  Location: [facility] INVASIVE CV LAB;  Service: Cardiovascular;  Laterality: N/A;  . LOWER EXTREMITY ANGIOGRAPHY N/A 05/15/2017   Procedure: LOWER EXTREMITY ANGIOGRAPHY;  Surgeon: Maeola Harman, MD;  Location: [facility] INVASIVE CV LAB;  Service: Cardiovascular;  Laterality: N/A;  . PERIPHERAL VASCULAR ATHERECTOMY  05/01/2017   Procedure: PERIPHERAL VASCULAR ATHERECTOMY;  Surgeon: Maeola Harman, MD;  Location: [facility] INVASIVE CV LAB;  Service: Cardiovascular;;  Left SFA  . PERIPHERAL VASCULAR BALLOON ANGIOPLASTY  05/01/2017   Procedure: PERIPHERAL VASCULAR BALLOON ANGIOPLASTY;  Surgeon: Maeola Harman, MD;  Location: [facility] INVASIVE CV LAB;  Service: Cardiovascular;;  Left Anterial Tibial Artery  . PERIPHERAL VASCULAR INTERVENTION Right 05/15/2017   Procedure: PERIPHERAL VASCULAR INTERVENTION;  Surgeon: Maeola Harman, MD;  Location: [facility] INVASIVE CV LAB;  Service: Cardiovascular;  Laterality: Right;  SFA Stent  . US ECHOCARDIOGRAPHY  11-25-2008   EF 55-60%    Short Social History:  Social History   Tobacco Use  . Smoking status: Former Smoker    Last attempt to quit: 02/01/1980    Years since quitting: 37.3  . Smokeless tobacco: Never Used  Substance Use Topics  . Alcohol use: Yes    Comment: Wine Occasionally    No Known Allergies  Current Outpatient Medications  Medication Sig Dispense Refill  . allopurinol (ZYLOPRIM) 300 MG tablet Take 300 mg by mouth daily.   11  . atorvastatin (LIPITOR) 40 MG tablet Take 1 tablet (40 mg total) by mouth daily. 90 tablet 3  . busPIRone (BUSPAR) 15 MG tablet  Take 15 mg by mouth 2 (two) times daily.     . clopidogrel (PLAVIX) 75 MG tablet TAKE ONE TABLET BY MOUTH EVERY MORNING (Patient taking differently: TAKE ONE TABLET (75 MG) BY MOUTH EVERY MORNING) 30 tablet 11  . Coenzyme Q10 (COQ10) 100 MG CAPS Take 100 mg by mouth every evening.    Marland Kitchen doxazosin (CARDURA) 4 MG tablet Take 4 mg by mouth every evening.     . DULoxetine (CYMBALTA) 60 MG capsule Take 60 mg by mouth daily.      Marland Kitchen enoxaparin (LOVENOX) 120 MG/0.8ML injection Inject 120 mg into the skin.    Marland Kitchen FLOVENT HFA 44 MCG/ACT inhaler Inhale 3 puffs into the lungs 2 (two) times daily.     . furosemide (LASIX) 20 MG tablet Take 40 mg by mouth 2 (two) times daily.   11  . glucagon 1 MG injection Inject 1 mg into the vein once as needed (for low blood sugars.).     Marland Kitchen ipratropium (ATROVENT) 0.02 % nebulizer solution Take 0.5 mg by nebulization 2 (two) times daily as needed for shortness of breath.     Marland Kitchen LANTUS SOLOSTAR 100 UNIT/ML Solostar Pen Inject 30 Units into the skin 2 (two) times daily.   11  . lisinopril (PRINIVIL,ZESTRIL) 10 MG tablet Take 10 mg by mouth daily.  3  . Magnesium Oxide 400 (240 Mg) MG TABS Take 400 mg by mouth 2 (two) times daily.  5  . metoprolol succinate (TOPROL-XL) 100 MG 24 hr tablet Take 150 mg by mouth daily.     Marland Kitchen morphine (AVINZA) 120 MG 24 hr capsule Take 120 mg by mouth daily.     . Multiple Vitamins-Minerals (CENTRUM SILVER ULTRA MENS PO) Take 1 tablet by mouth daily.  99  . nitroGLYCERIN (NITROSTAT) 0.4 MG SL tablet Place 0.4 mg under the tongue every 5 (five) minutes as needed for chest pain.     . Omega-3 Fatty Acids (FISH OIL) 1000 MG CAPS Take 1 capsule by mouth 2 (two) times daily.    Marland Kitchen oxyCODONE (ROXICODONE) 15 MG immediate release tablet Take 15 mg by mouth 6 (six) times daily.     . silver sulfADIAZINE (SILVADENE) 1 % cream Apply 1 application topically daily as needed (apply to toes).    Marland Kitchen VICTOZA 18 MG/3ML SOPN Inject 1.8 mg into the skin daily.     Marland Kitchen  warfarin (COUMADIN) 2.5 MG tablet Take 2.5 mg by mouth every Tuesday, Thursday, Saturday, and Sunday at 6 PM. Take 1 tab by mouth Tuesdays and Fridays   5  . warfarin (COUMADIN) 3 MG tablet Take 3 mg by mouth every Monday, Wednesday, and Friday at 6 PM.      No current facility-administered medications for this visit.     Review of Systems  Constitutional:  Constitutional negative. HENT: HENT negative.  Eyes: Eyes negative.  Respiratory: Positive for shortness of breath.  Cardiovascular: Positive for leg swelling.  Skin: Positive for wound.  Neurological: Neurological negative. Hematologic: Positive for bruises/bleeds easily.  Psychiatric: Psychiatric negative.        Objective:  Objective   Vitals:   06/09/17 1126 06/09/17 1131  BP: (!) 198/72 (!) 186/72  Pulse: 64   Resp: 20   Temp: 97.7 F (36.5 C)   TempSrc: Oral   SpO2: 98%   Weight: 258 lb (117 kg)   Height:  (1.778 m)    Body mass index is 37.02 kg/m.  Physical Exam  Constitutional: He appears well-developed.  HENT:  Head: Normocephalic.  Eyes: Pupils are equal, round, and reactive to light.  Cardiovascular: Normal rate.  Strong dp/pt/peroneal signals bilaterally  Pulmonary/Chest: Effort normal.  Abdominal: Soft. He exhibits no mass.  Musculoskeletal: He exhibits edema.  Neurological: He is alert.  Skin:  ble edema and skin changes consistent with c4 venous disease    Data: I have interpreted his bilateral lower extremity duplexes to demonstrate 50 to 99% stenosis in his proximal right SFA stent monophasic waveforms.  On the left there are monophasic waveforms with 50 to 74% stenosis in the mid SFA.  ABIs on the right 0.61 with toe pressure 84 him on the left 0.78 with toe pressure 134.     Assessment/Plan:     77 year old male has recently undergone bilateral SFA interventions for bilateral great toe ulceration.  He has healed partial amputation on the left great toe has a persistent ulceration  on the right.  He does have bilateral stenoses in his SFAs on the stent on the right and the previous endovascular intervention on the left.  Given these were recently done and he has adequate toe pressures I would not want to pursue any further intervention particularly that he has underlying kidney disease as well.  We will follow him up in 3 to 4 months with repeat duplexes and ABIs.  Hopefully at that time he has healed his ulceration.  He also has C4 venous disease and I recommended compression therapy.  I would not want to pursue any venous ablation on a patient who may need to veins in the future for arterial revascularization.  If he has worsening of symptoms between now and 3 to 4 months I will see him sooner.  He demonstrates good understanding the presence of his wife will continue Plavix and Coumadin.     Maeola Harman MD [facility]

## 2017-07-31 ENCOUNTER — Encounter: Payer: Self-pay | Admitting: Cardiology

## 2017-08-01 ENCOUNTER — Other Ambulatory Visit: Payer: Self-pay

## 2017-08-01 DIAGNOSIS — I7025 Atherosclerosis of native arteries of other extremities with ulceration: Secondary | ICD-10-CM

## 2017-08-01 DIAGNOSIS — L98499 Non-pressure chronic ulcer of skin of other sites with unspecified severity: Secondary | ICD-10-CM

## 2017-08-02 ENCOUNTER — Ambulatory Visit (INDEPENDENT_AMBULATORY_CARE_PROVIDER_SITE_OTHER): Payer: Medicare PPO | Admitting: Vascular Surgery

## 2017-08-02 ENCOUNTER — Encounter: Payer: Self-pay | Admitting: Vascular Surgery

## 2017-08-02 ENCOUNTER — Ambulatory Visit (HOSPITAL_COMMUNITY)
Admission: RE | Admit: 2017-08-02 | Discharge: 2017-08-02 | Disposition: A | Discharge status: Home / Self Care | Payer: Medicare PPO | Source: Ambulatory Visit | Attending: Vascular Surgery | Admitting: Vascular Surgery

## 2017-08-02 ENCOUNTER — Other Ambulatory Visit: Payer: Self-pay

## 2017-08-02 VITALS — BP 193/82 | HR 63 | Temp 97.8°F | Resp 20 | Ht 70.0 in | Wt 251.0 lb

## 2017-08-02 DIAGNOSIS — I872 Venous insufficiency (chronic) (peripheral): Secondary | ICD-10-CM | POA: Diagnosis not present

## 2017-08-02 DIAGNOSIS — I7025 Atherosclerosis of native arteries of other extremities with ulceration: Secondary | ICD-10-CM | POA: Diagnosis not present

## 2017-08-02 DIAGNOSIS — L98499 Non-pressure chronic ulcer of skin of other sites with unspecified severity: Secondary | ICD-10-CM

## 2017-08-02 NOTE — Progress Notes (Signed)
Patient ID: Paul Chen, male   DOB: [birth year], 77 y.o.   MRN: [id number]  Reason for Consult: Follow-up (Lymphedema of lower extremity.  Dr. Toni Arthurs [phone].)   Referred by Toni Arthurs, MD  Subjective:     HPI:  Paul Chen is a 77 y.o. male with previous bilateral lower extremity endovascular interventions.  He remains on Coumadin and Plavix.  He does have swelling of his bilateral lower extremities.  He has had an amputation of his left great toe that was partial continues to have a wound on the right great toe that he is treating what sounds like with Santyl.  States that he is wearing very mild compression because he cannot get actual compression stockings on.  We discussed elevation of his legs last time as well.  He is walking with the help of a cane.  Past Medical History:  Diagnosis Date  . Chronic renal insufficiency   . CKD (chronic kidney disease), stage III (HCC)   . COPD (chronic obstructive pulmonary disease) (HCC)    with restriction  . Coronary disease October 2010   Status post stenting of the proximal to mid right coronary with DES.  Status post extensive stenting of the proximal right coronary to the crux.  He has moderate disease in  the left coronary system  . Depression   . Diabetes mellitus    Insulin Dependent  . Diastolic dysfunction    Congestive heart failure with  . Dyspnea    multifactorial. chronic  . Gout   . Hypertension   . Hypoventilation    syndrome  . Obesity   . Pulmonary embolism [facility]) March 2010   hx of and DVT in March of 2010 on chronic Coumadin therapy  . Pulmonary hypertension (HCC)    Moderate to severe  . Retroperitoneal bleed    Hx of right pelvic  . Sleep apnea    Obstructive , no CPAP   Family History  Problem Relation Age of Onset  . Heart attack Mother   . Coronary artery disease Mother   . Hypertension Mother   . Coronary artery disease Father   . Heart attack Father    Past Surgical History:    Procedure Laterality Date  . AMPUTATION TOE Left 05/04/2017   Procedure: Left hallux amputation;  Surgeon: Toni Arthurs, MD;  Location: [facility];  Service: Orthopedics;  Laterality: Left;  . BACK SURGERY  2000   post  . CARDIOVASCULAR STRESS TEST  11-14-2008   EF 44%  . EXPLORATORY LAPAROTOMY  1972   POST  . LOWER EXTREMITY ANGIOGRAPHY N/A 05/01/2017   Procedure: LOWER EXTREMITY ANGIOGRAPHY;  Surgeon: Maeola Harman, MD;  Location: [facility] INVASIVE CV LAB;  Service: Cardiovascular;  Laterality: N/A;  . LOWER EXTREMITY ANGIOGRAPHY N/A 05/15/2017   Procedure: LOWER EXTREMITY ANGIOGRAPHY;  Surgeon: Maeola Harman, MD;  Location: [facility] INVASIVE CV LAB;  Service: Cardiovascular;  Laterality: N/A;  . PERIPHERAL VASCULAR ATHERECTOMY  05/01/2017   Procedure: PERIPHERAL VASCULAR ATHERECTOMY;  Surgeon: Maeola Harman, MD;  Location: [facility] INVASIVE CV LAB;  Service: Cardiovascular;;  Left SFA  . PERIPHERAL VASCULAR BALLOON ANGIOPLASTY  05/01/2017   Procedure: PERIPHERAL VASCULAR BALLOON ANGIOPLASTY;  Surgeon: Maeola Harman, MD;  Location: [facility] INVASIVE CV LAB;  Service: Cardiovascular;;  Left Anterial Tibial Artery  . PERIPHERAL VASCULAR INTERVENTION Right 05/15/2017   Procedure: PERIPHERAL VASCULAR INTERVENTION;  Surgeon: Maeola Harman, MD;  Location: [facility] INVASIVE CV LAB;  Service: Cardiovascular;  Laterality: Right;  SFA Stent  . US ECHOCARDIOGRAPHY  11-25-2008   EF 55-60%    Short Social History:  Social History   Tobacco Use  . Smoking status: Former Smoker    Last attempt to quit: 02/01/1980    Years since quitting: 37.5  . Smokeless tobacco: Never Used  Substance Use Topics  . Alcohol use: Yes    Comment: Wine Occasionally    No Known Allergies  Current Outpatient Medications  Medication Sig Dispense Refill  . allopurinol (ZYLOPRIM) 300 MG tablet Take 300 mg by mouth daily.   11  . atorvastatin (LIPITOR) 40 MG tablet Take 1 tablet  (40 mg total) by mouth daily. 90 tablet 3  . busPIRone (BUSPAR) 15 MG tablet Take 15 mg by mouth 2 (two) times daily.     . clopidogrel (PLAVIX) 75 MG tablet TAKE ONE TABLET BY MOUTH EVERY MORNING (Patient taking differently: TAKE ONE TABLET (75 MG) BY MOUTH EVERY MORNING) 30 tablet 11  . Coenzyme Q10 (COQ10) 100 MG CAPS Take 100 mg by mouth every evening.    Marland Kitchen doxazosin (CARDURA) 4 MG tablet Take 4 mg by mouth every evening.     . DULoxetine (CYMBALTA) 60 MG capsule Take 60 mg by mouth daily.      Marland Kitchen enoxaparin (LOVENOX) 120 MG/0.8ML injection Inject 120 mg into the skin.    Marland Kitchen FLOVENT HFA 44 MCG/ACT inhaler Inhale 3 puffs into the lungs 2 (two) times daily.     . furosemide (LASIX) 20 MG tablet Take 40 mg by mouth 2 (two) times daily.   11  . glucagon 1 MG injection Inject 1 mg into the vein once as needed (for low blood sugars.).     Marland Kitchen ipratropium (ATROVENT) 0.02 % nebulizer solution Take 0.5 mg by nebulization 2 (two) times daily as needed for shortness of breath.     Marland Kitchen LANTUS SOLOSTAR 100 UNIT/ML Solostar Pen Inject 30 Units into the skin 2 (two) times daily.   11  . lisinopril (PRINIVIL,ZESTRIL) 10 MG tablet Take 10 mg by mouth daily.  3  . Magnesium Oxide 400 (240 Mg) MG TABS Take 400 mg by mouth 2 (two) times daily.  5  . metoprolol succinate (TOPROL-XL) 100 MG 24 hr tablet Take 150 mg by mouth daily.     Marland Kitchen morphine (AVINZA) 120 MG 24 hr capsule Take 120 mg by mouth daily.     . Multiple Vitamins-Minerals (CENTRUM SILVER ULTRA MENS PO) Take 1 tablet by mouth daily.  99  . nitroGLYCERIN (NITROSTAT) 0.4 MG SL tablet Place 0.4 mg under the tongue every 5 (five) minutes as needed for chest pain.     . Omega-3 Fatty Acids (FISH OIL) 1000 MG CAPS Take 1 capsule by mouth 2 (two) times daily.    Marland Kitchen oxyCODONE (ROXICODONE) 15 MG immediate release tablet Take 15 mg by mouth 6 (six) times daily.     . silver sulfADIAZINE (SILVADENE) 1 % cream Apply 1 application topically daily as needed (apply to  toes).    Marland Kitchen VICTOZA 18 MG/3ML SOPN Inject 1.8 mg into the skin daily.     Marland Kitchen warfarin (COUMADIN) 2.5 MG tablet Take 2.5 mg by mouth every Tuesday, Thursday, Saturday, and Sunday at 6 PM. Take 1 tab by mouth Tuesdays and Fridays   5  . warfarin (COUMADIN) 3 MG tablet Take 3 mg by mouth every Monday, Wednesday, and Friday at 6 PM.      No current facility-administered medications for this  visit.     Review of Systems  Constitutional:  Constitutional negative. HENT: HENT negative.  Eyes: Eyes negative.  Respiratory: Positive for shortness of breath and wheezing.  Cardiovascular: Positive for irregular heartbeat, leg swelling and orthopnea.  GI: Gastrointestinal negative.  Musculoskeletal: Positive for leg pain.  Skin: Positive for wound.  Neurological: Positive for dizziness.  Hematologic: Positive for bruises/bleeds easily.  Psychiatric: Psychiatric negative.        Objective:  Objective   Vitals:   08/02/17 0859 08/02/17 0902  BP: (!) 188/79 (!) 193/82  Pulse: 63   Resp: 20   Temp: 97.8 F (36.6 C)   TempSrc: Oral   SpO2: 97%   Weight: 251 lb (113.9 kg)   Height: 5\' 10"  (1.778 m)    Body mass index is 36.01 kg/m.  Physical Exam  Constitutional: He appears well-developed.  HENT:  Head: Normocephalic.  Eyes: Pupils are equal, round, and reactive to light.  Neck: Normal range of motion.  Cardiovascular: Normal rate.  There is a strong posterior tibial signal on the right and posterior tibial and dorsalis pedis signals on the left  Pulmonary/Chest: Effort normal.  Abdominal: Soft.  Musculoskeletal: He exhibits edema.  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  Psychiatric: He has a normal mood and affect. His behavior is normal. Judgment and thought content normal.        Data: I have independently interpreted his venous reflux study to have significant deep and superficial reflux up to 2200 ms on the right side with a greater saphenous  vein measuring 0.7 cm in the distal thigh.  On the left side there is no identifiable reflux with a GSV measuring 0.81 cm.     Assessment/Plan:     77 year old male follows up after bilateral lower extremity arterial interventions with persistent lower extremity swelling.  He also has an ulceration of his right great toe this is being followed by [name] and he has healed his partial left great toe amputation.  He remains on Coumadin and Plavix.  He has been inconsistently wearing compression stockings at this time.  Given the reflux only on the right side now on the left I am unsure that ablating his veins would do much help and in light of his arterial disease he may need these veins in the future.  I would continue with nonoperative therapy at this time given that his signals in his feet sound adequate and he appears to be perfusing his feet much better at this time.  He would be a candidate for more surgery on the right if necessary from an orthopedic standpoint.  I have given him information regarding compression stockings when to wear them and also to elevate his legs when recumbent to help with the edema.  We will see him back in 3 months with arterial studies unless issues arise before.     Maeola HarmanBrandon Christopher Dwyne Hasegawa MD [facility]

## 2017-08-07 ENCOUNTER — Encounter: Payer: Self-pay | Admitting: Orthopedic Surgery

## 2017-08-08 NOTE — Progress Notes (Signed)
Cardiology Office Note    Date:  08/09/2017   ID:  Paul Chen 1940-11-04, MRN [id number]  PCP:  Paul Richmond, MD  Cardiologist:  Dr. Swaziland  Chief Complaint  Patient presents with  . Coronary Artery Disease  . Hypertension    History of Present Illness:  Paul Chen is a 77 y.o. male with PMH of COPD, CKD stage III, IDDM, HTN, PAD, obesity hypoventilation syndrome, history of DVT and PE in 2010, moderate to severe pulmonary hypertension, obstructive sleep apnea and CAD.  He underwent sequential DES x3 to proximal to mid RCA in 2010.  Repeat cardiac catheterization in 2011 showed patent stents.  Myoview in August 2012 showed inferior infarct without ischemia, EF 51%.  He is on chronic Plavix and the Coumadin therapy for his coronary artery disease and also PE.  Carotid Doppler in April 2018 showed moderate RICA disease and a mild left ICA disease.  He underwent a stress test on 07/28/2016 which showed EF 52%, small inferoapical defect that improves during recovery, does not appears to represent significant ischemia and may represent soft tissue attenuation, overall considered low risk scan.  He was being followed by [facility] for peripheral arterial disease.  Last lower extremity Doppler in August 2018 showed arterial obstruction involving the right SFA, popliteal artery and of tibial peroneal vessel, arterial obstruction also involving the left aortoiliac segment. He was seen in March for pre operative evaluation for left toe amputation for osteomyelitis. Recommendations made concerning bridging anticoagulation. He underwent left hallux amputation on 05/04/17. On 05/15/17 he underwent revascularization of the left SFA with PTA resulting in dissection and subsequent stenting.   On follow up today he is doing very well from a cardiac standpoint. Denies any chest pain, dyspnea, palpitations. Noted some SOB a while ago but states this has resolved. States a second toe that wasn't amputated still  hasn't healed. Followed by Dr. Victorino Dike. BP has been high. Doesn't monitor at home.      Past Medical History:  Diagnosis Date  . Chronic renal insufficiency   . CKD (chronic kidney disease), stage III (HCC)   . COPD (chronic obstructive pulmonary disease) (HCC)    with restriction  . Coronary disease October 2010   Status post stenting of the proximal to mid right coronary with DES.  Status post extensive stenting of the proximal right coronary to the crux.  He has moderate disease in  the left coronary system  . Depression   . Diabetes mellitus    Insulin Dependent  . Diastolic dysfunction    Congestive heart failure with  . Dyspnea    multifactorial. chronic  . Gout   . Hypertension   . Hypoventilation    syndrome  . Obesity   . Pulmonary embolism [facility]) March 2010   hx of and DVT in March of 2010 on chronic Coumadin therapy  . Pulmonary hypertension (HCC)    Moderate to severe  . Retroperitoneal bleed    Hx of right pelvic  . Sleep apnea    Obstructive , no CPAP    Past Surgical History:  Procedure Laterality Date  . AMPUTATION TOE Left 05/04/2017   Procedure: Left hallux amputation;  Surgeon: Toni Arthurs, MD;  Location: [facility];  Service: Orthopedics;  Laterality: Left;  . BACK SURGERY  2000   post  . CARDIOVASCULAR STRESS TEST  11-14-2008   EF 44%  . EXPLORATORY LAPAROTOMY  1972   POST  . LOWER EXTREMITY  ANGIOGRAPHY N/A 05/01/2017   Procedure: LOWER EXTREMITY ANGIOGRAPHY;  Surgeon: Maeola Harman, MD;  Location: [facility] INVASIVE CV LAB;  Service: Cardiovascular;  Laterality: N/A;  . LOWER EXTREMITY ANGIOGRAPHY N/A 05/15/2017   Procedure: LOWER EXTREMITY ANGIOGRAPHY;  Surgeon: Maeola Harman, MD;  Location: [facility] INVASIVE CV LAB;  Service: Cardiovascular;  Laterality: N/A;  . PERIPHERAL VASCULAR ATHERECTOMY  05/01/2017   Procedure: PERIPHERAL VASCULAR ATHERECTOMY;  Surgeon: Maeola Harman, MD;  Location: [facility] INVASIVE CV LAB;   Service: Cardiovascular;;  Left SFA  . PERIPHERAL VASCULAR BALLOON ANGIOPLASTY  05/01/2017   Procedure: PERIPHERAL VASCULAR BALLOON ANGIOPLASTY;  Surgeon: Maeola Harman, MD;  Location: [facility] INVASIVE CV LAB;  Service: Cardiovascular;;  Left Anterial Tibial Artery  . PERIPHERAL VASCULAR INTERVENTION Right 05/15/2017   Procedure: PERIPHERAL VASCULAR INTERVENTION;  Surgeon: Maeola Harman, MD;  Location: [facility] INVASIVE CV LAB;  Service: Cardiovascular;  Laterality: Right;  SFA Stent  . US ECHOCARDIOGRAPHY  11-25-2008   EF 55-60%    Current Medications: Outpatient Medications Prior to Visit  Medication Sig Dispense Refill  . allopurinol (ZYLOPRIM) 300 MG tablet Take 300 mg by mouth daily.   11  . atorvastatin (LIPITOR) 40 MG tablet Take 1 tablet (40 mg total) by mouth daily. 90 tablet 3  . busPIRone (BUSPAR) 15 MG tablet Take 15 mg by mouth 2 (two) times daily.     . clopidogrel (PLAVIX) 75 MG tablet TAKE ONE TABLET BY MOUTH EVERY MORNING (Patient taking differently: TAKE ONE TABLET (75 MG) BY MOUTH EVERY MORNING) 30 tablet 11  . Coenzyme Q10 (COQ10) 100 MG CAPS Take 100 mg by mouth every evening.    Marland Kitchen doxazosin (CARDURA) 4 MG tablet Take 4 mg by mouth every evening.     . DULoxetine (CYMBALTA) 60 MG capsule Take 60 mg by mouth daily.      Marland Kitchen enoxaparin (LOVENOX) 120 MG/0.8ML injection Inject 120 mg into the skin.    Marland Kitchen FLOVENT HFA 44 MCG/ACT inhaler Inhale 3 puffs into the lungs 2 (two) times daily.     . furosemide (LASIX) 20 MG tablet Take 40 mg by mouth 2 (two) times daily.   11  . glucagon 1 MG injection Inject 1 mg into the vein once as needed (for low blood sugars.).     Marland Kitchen ipratropium (ATROVENT) 0.02 % nebulizer solution Take 0.5 mg by nebulization 2 (two) times daily as needed for shortness of breath.     Marland Kitchen LANTUS SOLOSTAR 100 UNIT/ML Solostar Pen Inject 30 Units into the skin 2 (two) times daily.   11  . lisinopril (PRINIVIL,ZESTRIL) 10 MG tablet Take 10 mg by mouth daily.   3  . Magnesium Oxide 400 (240 Mg) MG TABS Take 400 mg by mouth 2 (two) times daily.  5  . metoprolol succinate (TOPROL-XL) 100 MG 24 hr tablet Take 150 mg by mouth daily.     Marland Kitchen morphine (AVINZA) 120 MG 24 hr capsule Take 120 mg by mouth daily.     . Multiple Vitamins-Minerals (CENTRUM SILVER ULTRA MENS PO) Take 1 tablet by mouth daily.  99  . nitroGLYCERIN (NITROSTAT) 0.4 MG SL tablet Place 0.4 mg under the tongue every 5 (five) minutes as needed for chest pain.     . Omega-3 Fatty Acids (FISH OIL) 1000 MG CAPS Take 1 capsule by mouth 2 (two) times daily.    Marland Kitchen oxyCODONE (ROXICODONE) 15 MG immediate release tablet Take 15 mg by mouth 6 (six) times daily.     Marland Kitchen  silver sulfADIAZINE (SILVADENE) 1 % cream Apply 1 application topically daily as needed (apply to toes).    Marland Kitchen. VICTOZA 18 MG/3ML SOPN Inject 1.8 mg into the skin daily.     Marland Kitchen. warfarin (COUMADIN) 2.5 MG tablet Take 2.5 mg by mouth every Tuesday, Thursday, Saturday, and Sunday at 6 PM. Take 1 tab by mouth Tuesdays and Fridays   5  . warfarin (COUMADIN) 3 MG tablet Take 3 mg by mouth every Monday, Wednesday, and Friday at 6 PM.      No facility-administered medications prior to visit.      Allergies:   Patient has no known allergies.   Social History   Socioeconomic History  . Marital status: Divorced    Spouse name: Not on file  . Number of children: 3  . Years of education: Not on file  . Highest education level: Not on file  Occupational History  . Occupation: disabled    Employer: RETIRED  Social Needs  . Financial resource strain: Not on file  . Food insecurity:    Worry: Not on file    Inability: Not on file  . Transportation needs:    Medical: Not on file    Non-medical: Not on file  Tobacco Use  . Smoking status: Former Smoker    Last attempt to quit: 02/01/1980    Years since quitting: 37.5  . Smokeless tobacco: Never Used  Substance and Sexual Activity  . Alcohol use: Yes    Comment: Wine Occasionally  . Drug  use: Never  . Sexual activity: Not on file  Lifestyle  . Physical activity:    Days per week: Not on file    Minutes per session: Not on file  . Stress: Not on file  Relationships  . Social connections:    Talks on phone: Not on file    Gets together: Not on file    Attends religious service: Not on file    Active member of club or organization: Not on file    Attends meetings of clubs or organizations: Not on file    Relationship status: Not on file  Other Topics Concern  . Not on file  Social History Narrative  . Not on file     Family History:  The patient's family history includes Coronary artery disease in his father and mother; Heart attack in his father and mother; Hypertension in his mother.   ROS:   Please see the history of present illness.    ROS All other systems reviewed and are negative.   PHYSICAL EXAM:   VS:  BP (!) 190/70   Pulse (!) 57   Ht 5\' 10"  (1.778 m)   Wt 252 lb 9.6 oz (114.6 kg)   BMI 36.24 kg/m   Repeat BP 175/75 left arm GENERAL:  Well appearing, overweight WM in NAD.  HEENT:  PERRL, EOMI, sclera are clear. Oropharynx is clear. NECK:  No jugular venous distention, carotid upstroke brisk and symmetric, no bruits, no thyromegaly or adenopathy LUNGS:  Clear to auscultation bilaterally CHEST:  Unremarkable HEART:  RRR,  PMI not displaced or sustained,S1 and S2 within normal limits, no S3, no S4: no clicks, no rubs, no murmurs ABD:  Soft, nontender. BS +, no masses or bruits. No hepatomegaly, no splenomegaly EXT:  Trace edema, no cyanosis no clubbing SKIN:  Warm and dry.  No rashes NEURO:  Alert and oriented x 3. Cranial nerves II through XII intact. PSYCH:  Cognitively intact  Wt Readings from Last 3 Encounters:  08/09/17 252 lb 9.6 oz (114.6 kg)  08/02/17 251 lb (113.9 kg)  06/09/17 258 lb (117 kg)      Studies/Labs Reviewed:   EKG:  EKG is not ordered today.    Recent Labs: 05/15/2017: BUN 14; Creatinine, Ser 1.10; Hemoglobin  12.2; Potassium 3.6; Sodium 143   Lipid Panel    Component Value Date/Time   CHOL  11/27/2008 1038    187        ATP III CLASSIFICATION:  <200     mg/dL   Desirable  829-562  mg/dL   Borderline High  >=130    mg/dL   High          TRIG 865 (H) 11/27/2008 1038   HDL 22 (L) 11/27/2008 1038   CHOLHDL 8.5 11/27/2008 1038   VLDL 36 11/27/2008 1038   LDLCALC (H) 11/27/2008 1038    129        Total Cholesterol/HDL:CHD Risk Coronary Heart Disease Risk Table                     Men   Women  1/2 Average Risk   3.4   3.3  Average Risk       5.0   4.4  2 X Average Risk   9.6   7.1  3 X Average Risk  23.4   11.0        Use the calculated Patient Ratio above and the CHD Risk Table to determine the patient's CHD Risk.        ATP III CLASSIFICATION (LDL):  <100     mg/dL   Optimal  784-696  mg/dL   Near or Above                    Optimal  130-159  mg/dL   Borderline  295-284  mg/dL   High  >132     mg/dL   Very High    Additional studies/ records that were reviewed today include:   Myoview 07/28/2016 Study Highlights    Nuclear stress EF: 52%.  Small inferoapical defect that improves during recovery. Overall does not appear to represent significant ischemia. May represent soft tissue attenuation  Ovreall low risk scan        ASSESSMENT:    1. Essential hypertension   2. Coronary artery disease involving native coronary artery of native heart without angina pectoris   3. PAD (peripheral artery disease) (HCC)   4. CKD (chronic kidney disease), stage III (HCC)   5. Pure hypercholesterolemia      PLAN:  In order of problems listed above:  1. PAD: He had significantly abnormal ABI last year.  S/p left SFA PTA/stenting. No claudication. S/p toe amputation. Still has nonhealing other toe. Follow up with ortho. On Plavix for PV stent  2. Recurrent DVT/PE: On Coumadin, managed by primary care providers office.    3. CAD: s/p DES x 3 to proximal-mid RCA in 2010. No chest  pain, last Myoview was in June 2018 which was low risk.  4. Hypertension: Blood pressure is poorly controlled. maxed out on Toprol. Reluctant to increase lisinopril due to CKD. Will check chemistries today. Add amlodipine 5 mg daily.   5. Hyperlipidemia: On 40 mg daily of Lipitor.  Check lipids today.   6. DM 2: Managed by primary care provider, on insulin.   Will need to have diabetes tightly controlled given his combination of lower  extremity ischemia, peripheral arterial disease, and a history of coronary artery disease.    Medication Adjustments/Labs and Tests Ordered: Current medicines are reviewed at length with the patient today.  Concerns regarding medicines are outlined above.  Medication changes, Labs and Tests ordered today are listed in the Patient Instructions below. Patient Instructions  We will check blood work today  Add amlodipine 5 mg daily to your current medication for blood pressure control  I will see you in 4 months     Signed, Bowden Boody Sw[place], MD  08/09/2017 1:33 PM    [facility] [address] [place], [place], [place]  [address] Phone: [phone]; Fax: [phone]

## 2017-08-09 ENCOUNTER — Encounter: Payer: Self-pay | Admitting: Cardiology

## 2017-08-09 ENCOUNTER — Ambulatory Visit: Payer: Medicare PPO | Admitting: Cardiology

## 2017-08-09 VITALS — BP 190/70 | HR 57 | Ht 70.0 in | Wt 252.6 lb

## 2017-08-09 DIAGNOSIS — I2510 Atherosclerotic heart disease of native coronary artery without angina pectoris: Secondary | ICD-10-CM | POA: Diagnosis not present

## 2017-08-09 DIAGNOSIS — I10 Essential (primary) hypertension: Secondary | ICD-10-CM

## 2017-08-09 DIAGNOSIS — E7800 Pure hypercholesterolemia, unspecified: Secondary | ICD-10-CM | POA: Diagnosis not present

## 2017-08-09 DIAGNOSIS — N183 Chronic kidney disease, stage 3 unspecified: Secondary | ICD-10-CM

## 2017-08-09 DIAGNOSIS — I739 Peripheral vascular disease, unspecified: Secondary | ICD-10-CM | POA: Diagnosis not present

## 2017-08-09 MED ORDER — AMLODIPINE BESYLATE 5 MG PO TABS: 5.0000 mg | ORAL_TABLET | Freq: Every day | ORAL | 3 refills | Status: DC

## 2017-08-09 NOTE — Patient Instructions (Addendum)
We will check blood work today  Add amlodipine 5 mg daily to your current medication for blood pressure control  I will see you in 4 months

## 2017-08-10 LAB — COMPREHENSIVE METABOLIC PANEL
ALT: 15 IU/L (ref 0–44)
AST: 15 IU/L (ref 0–40)
Albumin/Globulin Ratio: 1.3 (ref 1.2–2.2)
Albumin: 3.5 g/dL (ref 3.5–4.8)
Alkaline Phosphatase: 98 IU/L (ref 39–117)
BUN/Creatinine Ratio: 11 (ref 10–24)
BUN: 14 mg/dL (ref 8–27)
Bilirubin Total: 0.4 mg/dL (ref 0.0–1.2)
CO2: 31 mmol/L — ABNORMAL HIGH (ref 20–29)
Calcium: 8.5 mg/dL — ABNORMAL LOW (ref 8.6–10.2)
Chloride: 100 mmol/L (ref 96–106)
Creatinine, Ser: 1.27 mg/dL (ref 0.76–1.27)
GFR calc Af Amer: 63 mL/min/{1.73_m2} (ref >59)
GFR calc non Af Amer: 55 mL/min/{1.73_m2} — ABNORMAL LOW (ref >59)
Globulin, Total: 2.8 g/dL (ref 1.5–4.5)
Glucose: 156 mg/dL — ABNORMAL HIGH (ref 65–99)
Potassium: 4.1 mmol/L (ref 3.5–5.2)
Sodium: 143 mmol/L (ref 134–144)
Total Protein: 6.3 g/dL (ref 6.0–8.5)

## 2017-08-10 LAB — HEMOGLOBIN A1C
Est. average glucose Bld gHb Est-mCnc: 143 mg/dL
Hgb A1c MFr Bld: 6.6 % — ABNORMAL HIGH (ref 4.8–5.6)

## 2017-08-10 LAB — LIPID PANEL
Chol/HDL Ratio: 4.7 ratio (ref 0.0–5.0)
Cholesterol, Total: 118 mg/dL (ref 100–199)
HDL: 25 mg/dL — ABNORMAL LOW (ref >39)
LDL Calculated: 63 mg/dL (ref 0–99)
Triglycerides: 150 mg/dL — ABNORMAL HIGH (ref 0–149)
VLDL Cholesterol Cal: 30 mg/dL (ref 5–40)

## 2017-09-29 ENCOUNTER — Ambulatory Visit: Payer: Medicare PPO | Admitting: Vascular Surgery

## 2017-09-29 ENCOUNTER — Encounter (HOSPITAL_COMMUNITY): Payer: Medicare PPO

## 2017-11-10 ENCOUNTER — Ambulatory Visit: Payer: Medicare PPO | Admitting: Vascular Surgery

## 2017-11-10 ENCOUNTER — Encounter (HOSPITAL_COMMUNITY): Payer: Medicare PPO

## 2017-11-30 ENCOUNTER — Other Ambulatory Visit (HOSPITAL_COMMUNITY): Payer: Self-pay | Admitting: Orthopedic Surgery

## 2017-12-15 NOTE — Progress Notes (Signed)
Left message for Tresa EndoKelly at Dr. Angelica PouHewitts office to reschedule [facility]. Pt has an ASA IV score as of 05/04/17, which makes him an unsuitable candidate [facility].

## 2017-12-15 NOTE — Progress Notes (Signed)
Cardiology Office Note    Date:  12/18/2017   ID:  Paul Chen, Paul Chen [birth year], MRN [id number]  PCP:  Carmin Richmond, MD  Cardiologist:  Dr. Swaziland  No chief complaint on file.   History of Present Illness:  Paul Chen is a 77 y.o. male with PMH of COPD, CKD stage III, IDDM, HTN, PAD, obesity hypoventilation syndrome, history of DVT and PE in 2010, moderate to severe pulmonary hypertension, obstructive sleep apnea and CAD.  He underwent sequential DES x3 to proximal to mid RCA in 2010.  Repeat cardiac catheterization in 2011 showed patent stents.  Myoview in August 2012 showed inferior infarct without ischemia, EF 51%.  He is on chronic Plavix and the Coumadin therapy for his coronary artery disease and also PE.  Carotid Doppler in April 2018 showed moderate RICA disease and a mild left ICA disease.  He underwent a stress test on 07/28/2016 which showed EF 52%, small inferoapical defect that improves during recovery, does not appears to represent significant ischemia and may represent soft tissue attenuation, overall considered low risk scan.  He was being followed by [facility] for peripheral arterial disease.  Last lower extremity Doppler in August 2018 showed arterial obstruction involving the right SFA, popliteal artery and of tibial peroneal vessel, arterial obstruction also involving the left aortoiliac segment. He was seen in March for pre operative evaluation for left toe amputation for osteomyelitis. Recommendations made concerning bridging anticoagulation. He underwent left hallux amputation on 05/04/17. On 05/15/17 he underwent revascularization of the left SFA with PTA resulting in dissection and subsequent stenting. On his last visit we added amlodipine for persistent HTN.   On follow up today he is doing very well from a cardiac standpoint. Denies any chest pain, dyspnea, palpitations. He does report that he has a nonhealing ulcer now on his right great toe and is scheduled for amputation  on this Thursday. Also notes prior left foot is not looking good either.      Past Medical History:  Diagnosis Date  . Chronic renal insufficiency   . CKD (chronic kidney disease), stage III (HCC)   . COPD (chronic obstructive pulmonary disease) (HCC)    with restriction  . Coronary disease October 2010   Status post stenting of the proximal to mid right coronary with DES.  Status post extensive stenting of the proximal right coronary to the crux.  He has moderate disease in  the left coronary system  . Depression   . Diabetes mellitus    Insulin Dependent  . Diastolic dysfunction    Congestive heart failure with  . Dyspnea    multifactorial. chronic  . Gout   . Hypertension   . Hypoventilation    syndrome  . Obesity   . Pulmonary embolism [facility]) March 2010   hx of and DVT in March of 2010 on chronic Coumadin therapy  . Pulmonary hypertension (HCC)    Moderate to severe  . Retroperitoneal bleed    Hx of right pelvic  . Sleep apnea    Obstructive , no CPAP    Past Surgical History:  Procedure Laterality Date  . AMPUTATION TOE Left 05/04/2017   Procedure: Left hallux amputation;  Surgeon: Toni Arthurs, MD;  Location: [facility];  Service: Orthopedics;  Laterality: Left;  . BACK SURGERY  2000   post  . CARDIOVASCULAR STRESS TEST  11-14-2008   EF 44%  . EXPLORATORY LAPAROTOMY  1972   POST  . LOWER EXTREMITY  ANGIOGRAPHY N/A 05/01/2017   Procedure: LOWER EXTREMITY ANGIOGRAPHY;  Surgeon: Maeola Harmanain, Brandon Christopher, MD;  [facility];  Service: Cardiovascular;  Laterality: N/A;  . LOWER EXTREMITY ANGIOGRAPHY N/A 05/15/2017   Procedure: LOWER EXTREMITY ANGIOGRAPHY;  Surgeon: Maeola Harmanain, Brandon Christopher, MD;  [facility];  Service: Cardiovascular;  Laterality: N/A;  . PERIPHERAL VASCULAR ATHERECTOMY  05/01/2017   Procedure: PERIPHERAL VASCULAR ATHERECTOMY;  Surgeon: Maeola Harmanain, Brandon Christopher, MD;  [facility];  Service:  Cardiovascular;;  Left SFA  . PERIPHERAL VASCULAR BALLOON ANGIOPLASTY  05/01/2017   Procedure: PERIPHERAL VASCULAR BALLOON ANGIOPLASTY;  Surgeon: Maeola Harmanain, Brandon Christopher, MD;  [facility];  Service: Cardiovascular;;  Left Anterial Tibial Artery  . PERIPHERAL VASCULAR INTERVENTION Right 05/15/2017   Procedure: PERIPHERAL VASCULAR INTERVENTION;  Surgeon: Maeola Harmanain, Brandon Christopher, MD;  [facility];  Service: Cardiovascular;  Laterality: Right;  SFA Stent  . US ECHOCARDIOGRAPHY  11-25-2008   EF 55-60%    Current Medications: Outpatient Medications Prior to Visit  Medication Sig Dispense Refill  . allopurinol (ZYLOPRIM) 300 MG tablet Take 300 mg by mouth daily.   11  . amLODipine (NORVASC) 5 MG tablet Take 1 tablet (5 mg total) by mouth daily. 90 tablet 3  . atorvastatin (LIPITOR) 40 MG tablet Take 1 tablet (40 mg total) by mouth daily. 90 tablet 3  . busPIRone (BUSPAR) 15 MG tablet Take 15 mg by mouth 2 (two) times daily.     . clopidogrel (PLAVIX) 75 MG tablet TAKE ONE TABLET BY MOUTH EVERY MORNING (Patient taking differently: TAKE ONE TABLET (75 MG) BY MOUTH EVERY MORNING) 30 tablet 11  . Coenzyme Q10 (COQ10) 100 MG CAPS Take 100 mg by mouth every evening.    Marland Kitchen. doxazosin (CARDURA) 4 MG tablet Take 4 mg by mouth every evening.     . DULoxetine (CYMBALTA) 60 MG capsule Take 60 mg by mouth daily.      Marland Kitchen. enoxaparin (LOVENOX) 120 MG/0.8ML injection Inject 120 mg into the skin.    Marland Kitchen. FLOVENT HFA 44 MCG/ACT inhaler Inhale 3 puffs into the lungs 2 (two) times daily.     . furosemide (LASIX) 20 MG tablet Take 40 mg by mouth 2 (two) times daily.   11  . glucagon 1 MG injection Inject 1 mg into the vein once as needed (for low blood sugars.).     Marland Kitchen. ipratropium (ATROVENT) 0.02 % nebulizer solution Take 0.5 mg by nebulization 2 (two) times daily as needed for shortness of breath.     Marland Kitchen. LANTUS SOLOSTAR 100 UNIT/ML Solostar Pen Inject 30 Units into the skin 2 (two) times  daily.   11  . lisinopril (PRINIVIL,ZESTRIL) 10 MG tablet Take 10 mg by mouth daily.  3  . Magnesium Oxide 400 (240 Mg) MG TABS Take 400 mg by mouth 2 (two) times daily.  5  . metoprolol succinate (TOPROL-XL) 100 MG 24 hr tablet Take 150 mg by mouth daily.     Marland Kitchen. morphine (AVINZA) 120 MG 24 hr capsule Take 120 mg by mouth daily.     . Multiple Vitamins-Minerals (CENTRUM SILVER ULTRA MENS PO) Take 1 tablet by mouth daily.  99  . nitroGLYCERIN (NITROSTAT) 0.4 MG SL tablet Place 0.4 mg under the tongue every 5 (five) minutes as needed for chest pain.     . Omega-3 Fatty Acids (FISH OIL) 1000 MG CAPS Take 1 capsule by mouth 2 (two) times daily.    Marland Kitchen. oxyCODONE (  ROXICODONE) 15 MG immediate release tablet Take 15 mg by mouth 6 (six) times daily.     . silver sulfADIAZINE (SILVADENE) 1 % cream Apply 1 application topically daily as needed (apply to toes).    Marland Kitchen VICTOZA 18 MG/3ML SOPN Inject 1.8 mg into the skin daily.     Marland Kitchen warfarin (COUMADIN) 2.5 MG tablet Take 2.5 mg by mouth every Tuesday, Thursday, Saturday, and Sunday at 6 PM. Take 1 tab by mouth Tuesdays and Fridays   5  . warfarin (COUMADIN) 3 MG tablet Take 3 mg by mouth every Monday, Wednesday, and Friday at 6 PM.      No facility-administered medications prior to visit.      Allergies:   Patient has no known allergies.   Social History   Socioeconomic History  . Marital status: Divorced    Spouse name: Not on file  . Number of children: 3  . Years of education: Not on file  . Highest education level: Not on file  Occupational History  . Occupation: disabled    Employer: RETIRED  Social Needs  . Financial resource strain: Not on file  . Food insecurity:    Worry: Not on file    Inability: Not on file  . Transportation needs:    Medical: Not on file    Non-medical: Not on file  Tobacco Use  . Smoking status: Former Smoker    Last attempt to quit: 02/01/1980    Years since quitting: 37.9  . Smokeless tobacco: Never Used    Substance and Sexual Activity  . Alcohol use: Yes    Comment: Wine Occasionally  . Drug use: Never  . Sexual activity: Not on file  Lifestyle  . Physical activity:    Days per week: Not on file    Minutes per session: Not on file  . Stress: Not on file  Relationships  . Social connections:    Talks on phone: Not on file    Gets together: Not on file    Attends religious service: Not on file    Active member of club or organization: Not on file    Attends meetings of clubs or organizations: Not on file    Relationship status: Not on file  Other Topics Concern  . Not on file  Social History Narrative  . Not on file     Family History:  The patient's family history includes Coronary artery disease in his father and mother; Heart attack in his father and mother; Hypertension in his mother.   ROS:   Please see the history of present illness.    ROS All other systems reviewed and are negative.   PHYSICAL EXAM:   VS:  BP (!) 102/54   Pulse 76   Ht 5\' 9"  (1.753 m)   Wt 259 lb 3.2 oz (117.6 kg)   BMI 38.28 kg/m    GENERAL:  Well appearing, overweight WM in NAD.  HEENT:  PERRL, EOMI, sclera are clear. Oropharynx is clear. NECK:  No jugular venous distention, carotid upstroke brisk and symmetric, no bruits, no thyromegaly or adenopathy LUNGS:  Clear to auscultation bilaterally CHEST:  Unremarkable HEART:  RRR,  PMI not displaced or sustained,S1 and S2 within normal limits, no S3, no S4: no clicks, no rubs, no murmurs ABD:  Soft, nontender. BS +, no masses or bruits. No hepatomegaly, no splenomegaly EXT:  He is wearing compression hose, 1+ edema, no cyanosis no clubbing SKIN:  Warm and dry.  No  rashes NEURO:  Alert and oriented x 3. Cranial nerves II through XII intact. PSYCH:  Cognitively intact    Wt Readings from Last 3 Encounters:  12/18/17 259 lb 3.2 oz (117.6 kg)  08/09/17 252 lb 9.6 oz (114.6 kg)  08/02/17 251 lb (113.9 kg)      Studies/Labs Reviewed:   EKG:   EKG is not ordered today.    Recent Labs: 05/15/2017: Hemoglobin 12.2 08/09/2017: ALT 15; BUN 14; Creatinine, Ser 1.27; Potassium 4.1; Sodium 143   Lipid Panel    Component Value Date/Time   CHOL 118 08/09/2017 1402   TRIG 150 (H) 08/09/2017 1402   HDL 25 (L) 08/09/2017 1402   CHOLHDL 4.7 08/09/2017 1402   CHOLHDL 8.5 11/27/2008 1038   VLDL 36 11/27/2008 1038   LDLCALC 63 08/09/2017 1402   Dated 10/06/17: Hgb 12.3. BMET normal. Cholesterol 124, triglycerides 104, HDL 26, LDL 77. A1c 6.5%  Additional studies/ records that were reviewed today include:   Myoview 07/28/2016 Study Highlights    Nuclear stress EF: 52%.  Small inferoapical defect that improves during recovery. Overall does not appear to represent significant ischemia. May represent soft tissue attenuation  Ovreall low risk scan        ASSESSMENT:    1. Coronary artery disease involving native coronary artery of native heart without angina pectoris   2. PAD (peripheral artery disease) (HCC)   3. CKD (chronic kidney disease), stage III (HCC)   4. Essential hypertension   5. Pure hypercholesterolemia      PLAN:  In order of problems listed above:  1. PAD: He had significantly abnormal ABI last year.  S/p left SFA PTA/stenting. No claudication. S/p left hallux  amputation. Still has nonhealing ulcer right great toe.  Plan amputation later this week by Dr. Victorino Dike. On Plavix for PV stent. Bridging Lovenox therapy for planned procedure. Will need follow up with Dr. Randie Heinz.   2. Recurrent DVT/PE: On Coumadin, managed by primary care providers office.    3. CAD: s/p DES x 3 to proximal-mid RCA in 2010. No chest pain, last Myoview was in June 2018 which was low risk.  4. Hypertension: Blood pressure is now well controlled on amlodipine.  5. Hyperlipidemia: On 40 mg daily of Lipitor.  LDL 77.   6. DM 2: Managed by primary care provider, on insulin. A1c 6.5%.       Medication Adjustments/Labs and Tests  Ordered: Current medicines are reviewed at length with the patient today.  Concerns regarding medicines are outlined above.  Medication changes, Labs and Tests ordered today are listed in the Patient Instructions below. Patient Instructions  Continue your current therapy  Follow up in 6 months.      Signed, Paul Cavenaugh Swazil[place], MD  12/18/2017 12:12 PM    [facility] [address] [place], [place], [place]  [address] Phone: [phone]; Fax: [phone]

## 2017-12-18 ENCOUNTER — Ambulatory Visit: Payer: Medicare HMO | Admitting: Cardiology

## 2017-12-18 ENCOUNTER — Encounter: Payer: Self-pay | Admitting: Cardiology

## 2017-12-18 VITALS — BP 102/54 | HR 76 | Ht 69.0 in | Wt 259.2 lb

## 2017-12-18 DIAGNOSIS — N183 Chronic kidney disease, stage 3 unspecified: Secondary | ICD-10-CM

## 2017-12-18 DIAGNOSIS — I10 Essential (primary) hypertension: Secondary | ICD-10-CM

## 2017-12-18 DIAGNOSIS — E7800 Pure hypercholesterolemia, unspecified: Secondary | ICD-10-CM

## 2017-12-18 DIAGNOSIS — I739 Peripheral vascular disease, unspecified: Secondary | ICD-10-CM | POA: Diagnosis not present

## 2017-12-18 DIAGNOSIS — I2510 Atherosclerotic heart disease of native coronary artery without angina pectoris: Secondary | ICD-10-CM | POA: Diagnosis not present

## 2017-12-18 NOTE — Patient Instructions (Addendum)
Continue your current therapy  Follow up in 6 months   

## 2017-12-19 ENCOUNTER — Encounter (HOSPITAL_COMMUNITY): Payer: Self-pay | Admitting: *Deleted

## 2017-12-19 ENCOUNTER — Other Ambulatory Visit: Payer: Self-pay

## 2017-12-19 NOTE — Progress Notes (Signed)
Mr. Almon RegisterHigdon denies chest pain or shortness of breath.  Patient's cardiologist is Dr Jama FlavorsJordan,[facility], who he saw today. PCP Is Dr Natasha BenceJames Wayne Davis, Endocrinologist is Dr. Toni AmendStamalaros, [facility]. Mr. Almon RegisterHigdon is on Lovenox presently, Plavix and Coumadin were put on hold 12/17/17.  Patient's PCP manages anticoagulants, patient was not told if he should take Lovenox the morning of surgery or not.  I instructed patient to call SDr Hewitt's office in am and ask.  I called Dr ONEOKHewitt's scheduler and left a voice message with this question and asked her to call patient, if he does not call her.  Mr Almon RegisterHigdon is a Type II diabetic, he reports that in the past 2 weeks CBG's have run from 83 - 235. I instructed patient hat if CBG is > 70 on Thursday to take 1/2 of Lantus Insulin- 20 units. If CBG > 220 to call pre surgery desk and inform them what CBG is , and tell them that you do not have a Sliding Scale, you have meal coverage and ask for instructions.  I instructed patient to check CBG after awaking and every 2 hours until arrival  to the hospital.  I Instructed patient if CBG is less than 70 to drink  1/2 cup of a clear juice. Recheck CBG in 15 minutes then call pre- op desk at [phone] for further instructions. If scheduled to receive Insulin, do not take Insulin.  I had patient repeat instructions, he did so with minimal assistance.  I told patient if he has any questions to call pre surgery desk.

## 2017-12-21 ENCOUNTER — Ambulatory Visit (HOSPITAL_BASED_OUTPATIENT_CLINIC_OR_DEPARTMENT_OTHER): Payer: Medicare HMO | Admitting: Anesthesiology

## 2017-12-21 ENCOUNTER — Ambulatory Visit (HOSPITAL_BASED_OUTPATIENT_CLINIC_OR_DEPARTMENT_OTHER)
Admission: RE | Admit: 2017-12-21 | Discharge: 2017-12-21 | Disposition: A | Discharge status: Home / Self Care | Payer: Medicare HMO | Source: Ambulatory Visit | Attending: Orthopedic Surgery | Admitting: Orthopedic Surgery

## 2017-12-21 ENCOUNTER — Encounter (HOSPITAL_BASED_OUTPATIENT_CLINIC_OR_DEPARTMENT_OTHER): Payer: Self-pay | Admitting: *Deleted

## 2017-12-21 ENCOUNTER — Encounter (HOSPITAL_BASED_OUTPATIENT_CLINIC_OR_DEPARTMENT_OTHER)
Admission: RE | Disposition: A | Discharge status: Home / Self Care | Payer: Self-pay | Source: Ambulatory Visit | Attending: Orthopedic Surgery

## 2017-12-21 ENCOUNTER — Other Ambulatory Visit: Payer: Self-pay

## 2017-12-21 DIAGNOSIS — E1151 Type 2 diabetes mellitus with diabetic peripheral angiopathy without gangrene: Secondary | ICD-10-CM | POA: Diagnosis not present

## 2017-12-21 DIAGNOSIS — E11621 Type 2 diabetes mellitus with foot ulcer: Secondary | ICD-10-CM | POA: Insufficient documentation

## 2017-12-21 DIAGNOSIS — M109 Gout, unspecified: Secondary | ICD-10-CM | POA: Insufficient documentation

## 2017-12-21 DIAGNOSIS — Z89422 Acquired absence of other left toe(s): Secondary | ICD-10-CM | POA: Insufficient documentation

## 2017-12-21 DIAGNOSIS — L97516 Non-pressure chronic ulcer of other part of right foot with bone involvement without evidence of necrosis: Secondary | ICD-10-CM | POA: Insufficient documentation

## 2017-12-21 DIAGNOSIS — Z87891 Personal history of nicotine dependence: Secondary | ICD-10-CM | POA: Insufficient documentation

## 2017-12-21 DIAGNOSIS — I130 Hypertensive heart and chronic kidney disease with heart failure and stage 1 through stage 4 chronic kidney disease, or unspecified chronic kidney disease: Secondary | ICD-10-CM | POA: Diagnosis not present

## 2017-12-21 DIAGNOSIS — E1122 Type 2 diabetes mellitus with diabetic chronic kidney disease: Secondary | ICD-10-CM | POA: Diagnosis not present

## 2017-12-21 DIAGNOSIS — Z86711 Personal history of pulmonary embolism: Secondary | ICD-10-CM | POA: Diagnosis not present

## 2017-12-21 DIAGNOSIS — Z79899 Other long term (current) drug therapy: Secondary | ICD-10-CM | POA: Diagnosis not present

## 2017-12-21 DIAGNOSIS — Z79891 Long term (current) use of opiate analgesic: Secondary | ICD-10-CM | POA: Insufficient documentation

## 2017-12-21 DIAGNOSIS — J449 Chronic obstructive pulmonary disease, unspecified: Secondary | ICD-10-CM | POA: Diagnosis not present

## 2017-12-21 DIAGNOSIS — Z7902 Long term (current) use of antithrombotics/antiplatelets: Secondary | ICD-10-CM | POA: Insufficient documentation

## 2017-12-21 DIAGNOSIS — N183 Chronic kidney disease, stage 3 (moderate): Secondary | ICD-10-CM | POA: Diagnosis not present

## 2017-12-21 DIAGNOSIS — Z794 Long term (current) use of insulin: Secondary | ICD-10-CM | POA: Insufficient documentation

## 2017-12-21 DIAGNOSIS — Z955 Presence of coronary angioplasty implant and graft: Secondary | ICD-10-CM | POA: Insufficient documentation

## 2017-12-21 DIAGNOSIS — Z7901 Long term (current) use of anticoagulants: Secondary | ICD-10-CM | POA: Insufficient documentation

## 2017-12-21 DIAGNOSIS — M869 Osteomyelitis, unspecified: Secondary | ICD-10-CM | POA: Insufficient documentation

## 2017-12-21 DIAGNOSIS — E1169 Type 2 diabetes mellitus with other specified complication: Secondary | ICD-10-CM | POA: Insufficient documentation

## 2017-12-21 DIAGNOSIS — I2510 Atherosclerotic heart disease of native coronary artery without angina pectoris: Secondary | ICD-10-CM | POA: Insufficient documentation

## 2017-12-21 DIAGNOSIS — G4733 Obstructive sleep apnea (adult) (pediatric): Secondary | ICD-10-CM | POA: Diagnosis not present

## 2017-12-21 DIAGNOSIS — I509 Heart failure, unspecified: Secondary | ICD-10-CM | POA: Diagnosis not present

## 2017-12-21 DIAGNOSIS — E669 Obesity, unspecified: Secondary | ICD-10-CM | POA: Insufficient documentation

## 2017-12-21 DIAGNOSIS — F329 Major depressive disorder, single episode, unspecified: Secondary | ICD-10-CM | POA: Insufficient documentation

## 2017-12-21 DIAGNOSIS — Z86718 Personal history of other venous thrombosis and embolism: Secondary | ICD-10-CM | POA: Diagnosis not present

## 2017-12-21 DIAGNOSIS — Z6837 Body mass index (BMI) 37.0-37.9, adult: Secondary | ICD-10-CM | POA: Insufficient documentation

## 2017-12-21 HISTORY — DX: Pneumonia, unspecified organism: J18.9

## 2017-12-21 HISTORY — PX: AMPUTATION TOE: SHX6595

## 2017-12-21 HISTORY — DX: Unspecified osteoarthritis, unspecified site: M19.90

## 2017-12-21 HISTORY — DX: Personal history of other medical treatment: Z92.89

## 2017-12-21 LAB — POCT I-STAT, CHEM 8
BUN: 17 mg/dL (ref 8–23)
Calcium, Ion: 1.19 mmol/L (ref 1.15–1.40)
Chloride: 99 mmol/L (ref 98–111)
Creatinine, Ser: 1.2 mg/dL (ref 0.61–1.24)
Glucose, Bld: 77 mg/dL (ref 70–99)
HCT: 39 % (ref 39.0–52.0)
Hemoglobin: 13.3 g/dL (ref 13.0–17.0)
Potassium: 3.7 mmol/L (ref 3.5–5.1)
Sodium: 143 mmol/L (ref 135–145)
TCO2: 30 mmol/L (ref 22–32)

## 2017-12-21 LAB — GLUCOSE, CAPILLARY
Glucose-Capillary: 46 mg/dL — ABNORMAL LOW (ref 70–99)
Glucose-Capillary: 117 mg/dL — ABNORMAL HIGH (ref 70–99)

## 2017-12-21 SURGERY — AMPUTATION, TOE
Anesthesia: Monitor Anesthesia Care | Site: Foot | Laterality: Right

## 2017-12-21 MED ORDER — 0.9 % SODIUM CHLORIDE (POUR BTL) OPTIME
TOPICAL | Status: DC | PRN
  Administered 2017-12-21: 1000 mL

## 2017-12-21 MED ORDER — MIDAZOLAM HCL 2 MG/2ML IJ SOLN: 1.0000 mg | INTRAMUSCULAR | Status: DC | PRN

## 2017-12-21 MED ORDER — ROCURONIUM BROMIDE 50 MG/5ML IV SOSY
PREFILLED_SYRINGE | INTRAVENOUS | Status: AC
  Filled 2017-12-21: qty 10

## 2017-12-21 MED ORDER — FENTANYL CITRATE (PF) 100 MCG/2ML IJ SOLN
INTRAMUSCULAR | Status: AC
  Filled 2017-12-21: qty 2

## 2017-12-21 MED ORDER — BUPIVACAINE-EPINEPHRINE 0.25% -1:200000 IJ SOLN
INTRAMUSCULAR | Status: DC | PRN
  Administered 2017-12-21: 10 mL

## 2017-12-21 MED ORDER — FENTANYL CITRATE (PF) 100 MCG/2ML IJ SOLN
50.0000 ug | INTRAMUSCULAR | Status: DC | PRN
  Administered 2017-12-21: 50 ug via INTRAVENOUS

## 2017-12-21 MED ORDER — LACTATED RINGERS IV SOLN
INTRAVENOUS | Status: DC
  Administered 2017-12-21: 11:00:00 via INTRAVENOUS

## 2017-12-21 MED ORDER — PROPOFOL 500 MG/50ML IV EMUL
INTRAVENOUS | Status: DC | PRN
  Administered 2017-12-21: 50 ug/kg/min via INTRAVENOUS

## 2017-12-21 MED ORDER — CEFAZOLIN SODIUM-DEXTROSE 2-4 GM/100ML-% IV SOLN
INTRAVENOUS | Status: AC
  Filled 2017-12-21: qty 100

## 2017-12-21 MED ORDER — CEFAZOLIN SODIUM-DEXTROSE 2-4 GM/100ML-% IV SOLN
2.0000 g | INTRAVENOUS | Status: AC
  Administered 2017-12-21: 2 g via INTRAVENOUS

## 2017-12-21 MED ORDER — DEXTROSE 50 % IV SOLN
INTRAVENOUS | Status: AC
  Filled 2017-12-21: qty 50

## 2017-12-21 MED ORDER — DEXAMETHASONE SODIUM PHOSPHATE 10 MG/ML IJ SOLN
INTRAMUSCULAR | Status: AC
  Filled 2017-12-21: qty 2

## 2017-12-21 MED ORDER — CHLORHEXIDINE GLUCONATE 4 % EX LIQD: 60.0000 mL | Freq: Once | CUTANEOUS | Status: DC

## 2017-12-21 MED ORDER — PROPOFOL 500 MG/50ML IV EMUL
INTRAVENOUS | Status: AC
  Filled 2017-12-21: qty 50

## 2017-12-21 MED ORDER — ONDANSETRON HCL 4 MG/2ML IJ SOLN: 4.0000 mg | Freq: Once | INTRAMUSCULAR | Status: DC | PRN

## 2017-12-21 MED ORDER — DEXTROSE 50 % IV SOLN
INTRAVENOUS | Status: DC | PRN
  Administered 2017-12-21: 1 via INTRAVENOUS

## 2017-12-21 MED ORDER — FENTANYL CITRATE (PF) 250 MCG/5ML IJ SOLN
INTRAMUSCULAR | Status: AC
  Filled 2017-12-21: qty 5

## 2017-12-21 MED ORDER — ONDANSETRON HCL 4 MG/2ML IJ SOLN
INTRAMUSCULAR | Status: AC
  Filled 2017-12-21: qty 4

## 2017-12-21 MED ORDER — SODIUM CHLORIDE 0.9 % IV SOLN: INTRAVENOUS | Status: DC

## 2017-12-21 MED ORDER — FENTANYL CITRATE (PF) 100 MCG/2ML IJ SOLN: 25.0000 ug | INTRAMUSCULAR | Status: DC | PRN

## 2017-12-21 MED ORDER — SCOPOLAMINE 1 MG/3DAYS TD PT72: 1.0000 | MEDICATED_PATCH | Freq: Once | TRANSDERMAL | Status: DC | PRN

## 2017-12-21 MED ORDER — PROPOFOL 10 MG/ML IV BOLUS
INTRAVENOUS | Status: AC
  Filled 2017-12-21: qty 20

## 2017-12-21 SURGICAL SUPPLY — 61 items
BLADE AVERAGE 25MMX9MM (BLADE) ×1
BLADE AVERAGE 25X9 (BLADE) ×1 IMPLANT
BLADE OSC/SAG .038X5.5 CUT EDG (BLADE) IMPLANT
BLADE SURG 10 STRL SS (BLADE) IMPLANT
BLADE SURG 15 STRL LF DISP TIS (BLADE) ×1 IMPLANT
BLADE SURG 15 STRL SS (BLADE) ×6
BNDG CMPR 9X4 STRL LF SNTH (GAUZE/BANDAGES/DRESSINGS) ×1
BNDG COHESIVE 4X5 TAN STRL (GAUZE/BANDAGES/DRESSINGS) ×3 IMPLANT
BNDG CONFORM 3 STRL LF (GAUZE/BANDAGES/DRESSINGS) IMPLANT
BNDG ESMARK 4X9 LF (GAUZE/BANDAGES/DRESSINGS) ×3 IMPLANT
CHLORAPREP W/TINT 26ML (MISCELLANEOUS) ×3 IMPLANT
COVER BACK TABLE 60X90IN (DRAPES) ×3 IMPLANT
COVER WAND RF STERILE (DRAPES) IMPLANT
CUFF TOURNIQUET SINGLE 24IN (TOURNIQUET CUFF) IMPLANT
DECANTER SPIKE VIAL GLASS SM (MISCELLANEOUS) IMPLANT
DRAPE EXTREMITY T 121X128X90 (DRAPE) ×3 IMPLANT
DRAPE SURG 17X23 STRL (DRAPES) ×3 IMPLANT
DRAPE U-SHAPE 47X51 STRL (DRAPES) ×3 IMPLANT
DRSG EMULSION OIL 3X3 NADH (GAUZE/BANDAGES/DRESSINGS) IMPLANT
DRSG MEPITEL 4X7.2 (GAUZE/BANDAGES/DRESSINGS) ×3 IMPLANT
DRSG PAD ABDOMINAL 8X10 ST (GAUZE/BANDAGES/DRESSINGS) IMPLANT
ELECT REM PT RETURN 9FT ADLT (ELECTROSURGICAL) ×3
ELECTRODE REM PT RTRN 9FT ADLT (ELECTROSURGICAL) ×1 IMPLANT
GAUZE SPONGE 4X4 12PLY STRL (GAUZE/BANDAGES/DRESSINGS) ×3 IMPLANT
GLOVE BIO SURGEON STRL SZ8 (GLOVE) ×3 IMPLANT
GLOVE BIOGEL PI IND STRL 8 (GLOVE) ×2 IMPLANT
GLOVE BIOGEL PI INDICATOR 8 (GLOVE) ×4
GLOVE ECLIPSE 8.0 STRL XLNG CF (GLOVE) ×3 IMPLANT
GOWN STRL REUS W/ TWL LRG LVL3 (GOWN DISPOSABLE) ×1 IMPLANT
GOWN STRL REUS W/ TWL XL LVL3 (GOWN DISPOSABLE) ×2 IMPLANT
GOWN STRL REUS W/TWL LRG LVL3 (GOWN DISPOSABLE) ×3
GOWN STRL REUS W/TWL XL LVL3 (GOWN DISPOSABLE) ×6
NDL HYPO 25X1 1.5 SAFETY (NEEDLE) IMPLANT
NDL SAFETY ECLIPSE 18X1.5 (NEEDLE) IMPLANT
NEEDLE HYPO 18GX1.5 SHARP (NEEDLE)
NEEDLE HYPO 25X1 1.5 SAFETY (NEEDLE) IMPLANT
NS IRRIG 1000ML POUR BTL (IV SOLUTION) ×3 IMPLANT
PACK BASIN DAY SURGERY FS (CUSTOM PROCEDURE TRAY) ×3 IMPLANT
PAD CAST 4YDX4 CTTN HI CHSV (CAST SUPPLIES) ×1 IMPLANT
PADDING CAST COTTON 4X4 STRL (CAST SUPPLIES) ×3
PENCIL BUTTON HOLSTER BLD 10FT (ELECTRODE) ×3 IMPLANT
SANITIZER HAND PURELL 535ML FO (MISCELLANEOUS) ×3 IMPLANT
SHEET MEDIUM DRAPE 40X70 STRL (DRAPES) ×3 IMPLANT
SLEEVE SCD COMPRESS KNEE MED (MISCELLANEOUS) ×1 IMPLANT
SPONGE LAP 18X18 RF (DISPOSABLE) ×3 IMPLANT
STOCKINETTE 6  STRL (DRAPES) ×2
STOCKINETTE 6 STRL (DRAPES) ×1 IMPLANT
SUCTION FRAZIER HANDLE 10FR (MISCELLANEOUS)
SUCTION TUBE FRAZIER 10FR DISP (MISCELLANEOUS) IMPLANT
SUT ETHILON 2 0 FS 18 (SUTURE) IMPLANT
SUT ETHILON 2 0 FSLX (SUTURE) ×2 IMPLANT
SUT ETHILON 3 0 PS 1 (SUTURE) IMPLANT
SUT MNCRL AB 3-0 PS2 18 (SUTURE) IMPLANT
SWAB COLLECTION DEVICE MRSA (MISCELLANEOUS) IMPLANT
SWAB CULTURE ESWAB REG 1ML (MISCELLANEOUS) IMPLANT
SYR BULB 3OZ (MISCELLANEOUS) ×3 IMPLANT
SYR CONTROL 10ML LL (SYRINGE) IMPLANT
TOWEL GREEN STERILE FF (TOWEL DISPOSABLE) ×3 IMPLANT
TUBE CONNECTING 20'X1/4 (TUBING) ×1
TUBE CONNECTING 20X1/4 (TUBING) ×1 IMPLANT
UNDERPAD 30X30 (UNDERPADS AND DIAPERS) ×3 IMPLANT

## 2017-12-21 NOTE — Transfer of Care (Signed)
Immediate Anesthesia Transfer of Care Note  Patient: Paul Chen  Procedure(s) Performed: Right hallux amputation (Right Foot)  Patient Location: PACU  Anesthesia Type:MAC  Level of Consciousness: awake, alert  and oriented  Airway & Oxygen Therapy: Patient Spontanous Breathing and Patient connected to face mask oxygen  Post-op Assessment: Report given to RN and Post -op Vital signs reviewed and stable  Post vital signs: Reviewed and stable  Last Vitals:  Vitals Value Taken Time  BP    Temp    Pulse 57 12/21/2017  2:36 PM  Resp 10 12/21/2017  2:36 PM  SpO2 100 % 12/21/2017  2:36 PM  Vitals shown include unvalidated device data.  Last Pain:  Vitals:   12/21/17 0957  TempSrc: Oral  PainSc: 5          Complications: No apparent anesthesia complications

## 2017-12-21 NOTE — Op Note (Signed)
12/21/2017  2:36 PM  PATIENT:  Paul Chen  77 y.o. male  PRE-OPERATIVE DIAGNOSIS: Right hallux diabetic ulcer and osteomyelitis  POST-OPERATIVE DIAGNOSIS:  same   Procedure(s):  Right hallux amputation  SURGEON:  Wylene Simmer, MD  ASSISTANT: Mechele Claude, PA-C  ANESTHESIA: Local, MAC  EBL:  minimal   TOURNIQUET:   Total Tourniquet Time Documented: Calf (Right) - 10 minutes Total: Calf (Right) - 10 minutes  COMPLICATIONS:  None apparent  DISPOSITION:  Extubated, awake and stable to recovery.  INDICATION FOR PROCEDURE: The patient is a 77 year old male with the past medical history significant for diabetes, coronary artery disease, congestive heart failure, chronic kidney disease and COPD.  He has a nonhealing diabetic ulcer of his right hallux with osteomyelitis.  He presents today for amputation of the right hallux.  PROCEDURE IN DETAIL:  After pre operative consent was obtained, and the correct operative site was identified, the patient was brought to the operating room and placed supine on the OR table.  Anesthesia was administered.  Pre-operative antibiotics were administered.  A surgical timeout was taken.  The right lower extremity was prepped and draped in standard sterile fashion.  The foot was exsanguinated and a 4 inch Esmarch tourniquet wrapped around the ankle.  A digital block was then performed at the right hallux with quarter percent Marcaine with epinephrine.  A fishmouth incision was marked on the skin just proximal to the unhealthy soft tissue.  The incision was made and dissection was carried down through the subcutaneous tissues to the bone.  Subperiosteal dissection was carried proximally along the hallux proximal phalanx.  An oscillating saw was used to cut the bone, and the toe was passed off the field.  The wound was irrigated copiously.  Neurovascular bundles were cauterized.  Incision was closed with 2-0 nylon horizontal mattress sutures.  Sterile dressings  were applied followed by a compression wrap.  The tourniquet was released after application of the dressings.  The patient was awakened from anesthesia and transported to the recovery room in stable condition.   FOLLOW UP PLAN: Weightbearing as tolerated in a flat postop shoe.  Follow-up in the office in 3 weeks for suture removal.    Mechele Claude PA-C was present and scrubbed for the duration of the operative case. His assistance was essential in positioning the patient, prepping and draping, gaining and maintaining exposure, performing the operation, closing and dressing the wounds and applying the splint.

## 2017-12-21 NOTE — H&P (Signed)
Paul Chen is an 77 y.o. male.   Chief Complaint: Right hallux diabetic ulcer HPI: The patient is a 77 year old male with a past medical history significant for coronary artery disease, diabetes, chronic kidney disease and COPD.  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  He presents today for amputation of his right hallux.  Past Medical History:  Diagnosis Date  . Arthritis   . Chronic renal insufficiency   . CKD (chronic kidney disease), stage III (HCC)   . COPD (chronic obstructive pulmonary disease) (HCC)    with restriction  . Coronary disease October 2010   Status post stenting of the proximal to mid right coronary with DES.  Status post extensive stenting of the proximal right coronary to the crux.  He has moderate disease in  the left coronary system  . Depression   . Diabetes mellitus    Insulin Dependent  . Diastolic dysfunction    Congestive heart failure with  . Dyspnea    multifactorial. chronic, 11/18/9- not an issue at this time  . Gout   . History of blood transfusion   . Hypertension   . Hypoventilation    syndrome  . Obesity   . Pneumonia   . Pulmonary embolism [facility]) March 2010   hx of and DVT in March of 2010 on chronic Coumadin therapy  . Pulmonary hypertension (HCC)    Moderate to severe  . Retroperitoneal bleed    Hx of right pelvic  . Sleep apnea    Obstructive , no CPAP    Past Surgical History:  Procedure Laterality Date  . AMPUTATION TOE Left 05/04/2017   Procedure: Left hallux amputation;  Surgeon: Toni Arthurs, MD;  Location: [facility];  Service: Orthopedics;  Laterality: Left;  . BACK SURGERY  2000   post  . CARDIOVASCULAR STRESS TEST  11-14-2008   EF 44%  . EXPLORATORY LAPAROTOMY  1972   POST  . LOWER EXTREMITY ANGIOGRAPHY N/A 05/01/2017   Procedure: LOWER EXTREMITY ANGIOGRAPHY;  Surgeon: Maeola Harman, MD;  Location: [facility] INVASIVE CV LAB;  Service: Cardiovascular;   Laterality: N/A;  . LOWER EXTREMITY ANGIOGRAPHY N/A 05/15/2017   Procedure: LOWER EXTREMITY ANGIOGRAPHY;  Surgeon: Maeola Harman, MD;  Location: [facility] INVASIVE CV LAB;  Service: Cardiovascular;  Laterality: N/A;  . PERIPHERAL VASCULAR ATHERECTOMY  05/01/2017   Procedure: PERIPHERAL VASCULAR ATHERECTOMY;  Surgeon: Maeola Harman, MD;  Location: [facility] INVASIVE CV LAB;  Service: Cardiovascular;;  Left SFA  . PERIPHERAL VASCULAR BALLOON ANGIOPLASTY  05/01/2017   Procedure: PERIPHERAL VASCULAR BALLOON ANGIOPLASTY;  Surgeon: Maeola Harman, MD;  Location: [facility] INVASIVE CV LAB;  Service: Cardiovascular;;  Left Anterial Tibial Artery  . PERIPHERAL VASCULAR INTERVENTION Right 05/15/2017   Procedure: PERIPHERAL VASCULAR INTERVENTION;  Surgeon: Maeola Harman, MD;  Location: [facility] INVASIVE CV LAB;  Service: Cardiovascular;  Laterality: Right;  SFA Stent  . US ECHOCARDIOGRAPHY  11-25-2008   EF 55-60%    Family History  Problem Relation Age of Onset  . Heart attack Mother   . Coronary artery disease Mother   . Hypertension Mother   . Coronary artery disease Father   . Heart attack Father    Social History:  reports that he quit smoking about 37 years ago. He quit after 2.00 years of use. He has never used smokeless tobacco. He reports that he drinks alcohol. He reports that he does not use drugs.  Allergies: No Known Allergies  Medications Prior  to Admission  Medication Sig Dispense Refill  . albuterol (PROVENTIL) (2.5 MG/3ML) 0.083% nebulizer solution Take 2.5 mg by nebulization every 4 (four) hours as needed for wheezing or shortness of breath.     . allopurinol (ZYLOPRIM) 300 MG tablet Take 300 mg by mouth daily.   11  . amLODipine (NORVASC) 5 MG tablet Take 1 tablet (5 mg total) by mouth daily. 90 tablet 3  . atorvastatin (LIPITOR) 40 MG tablet Take 1 tablet (40 mg total) by mouth daily. 90 tablet 3  . busPIRone (BUSPAR) 15 MG tablet Take 15 mg by mouth 2 (two) times  daily.     . clopidogrel (PLAVIX) 75 MG tablet TAKE ONE TABLET BY MOUTH EVERY MORNING 30 tablet 11  . Coenzyme Q10 (COQ10) 100 MG CAPS Take 100 mg by mouth every evening.    Marland Kitchen doxazosin (CARDURA) 4 MG tablet Take 4 mg by mouth every evening.     . DULoxetine (CYMBALTA) 60 MG capsule Take 60 mg by mouth daily.      . EMBEDA 60-2.4 MG CPCR Take 2 capsules by mouth daily.  0  . enoxaparin (LOVENOX) 120 MG/0.8ML injection Inject 120 mg into the skin every 12 (twelve) hours.     Marland Kitchen FLOVENT HFA 44 MCG/ACT inhaler Inhale 3 puffs into the lungs 2 (two) times daily.     . furosemide (LASIX) 20 MG tablet Take 40 mg by mouth daily.   11  . gabapentin (NEURONTIN) 300 MG capsule Take 600 mg by mouth 2 (two) times daily.    Marland Kitchen ipratropium (ATROVENT) 0.02 % nebulizer solution Take 0.5 mg by nebulization every 4 (four) hours as needed for wheezing or shortness of breath.     Marland Kitchen LANTUS SOLOSTAR 100 UNIT/ML Solostar Pen Inject 40 Units into the skin daily.   11  . lisinopril (PRINIVIL,ZESTRIL) 10 MG tablet Take 10 mg by mouth daily.  3  . Magnesium Oxide 400 (240 Mg) MG TABS Take 400 mg by mouth 2 (two) times daily.  5  . metoprolol succinate (TOPROL-XL) 100 MG 24 hr tablet Take 150 mg by mouth daily.     . Multiple Vitamins-Minerals (CENTRUM SILVER ULTRA MENS PO) Take 1 tablet by mouth daily.  99  . NOVOLOG FLEXPEN 100 UNIT/ML FlexPen Inject 8 Units into the skin 2 (two) times daily with a meal.  11  . Omega-3 Fatty Acids (FISH OIL PO) Take 1 capsule by mouth 2 (two) times daily.     Marland Kitchen oxyCODONE (ROXICODONE) 15 MG immediate release tablet Take 15 mg by mouth 4 (four) times daily.     . silver sulfADIAZINE (SILVADENE) 1 % cream Apply 1 application topically daily as needed (apply to toes).    Marland Kitchen VICTOZA 18 MG/3ML SOPN Inject 1.8 mg into the skin daily.     Marland Kitchen warfarin (COUMADIN) 2.5 MG tablet Take 2.5 mg by mouth daily.   5  . ciclopirox (PENLAC) 8 % solution Apply 1 application topically at bedtime. Apply over nail  and surrounding skin. Apply daily over previous coat. After seven (7) days, may remove with alcohol and continue cycle.    Marland Kitchen glucagon 1 MG injection Inject 1 mg into the vein once as needed (for low blood sugars.).     Marland Kitchen naloxone (NARCAN) nasal spray 4 mg/0.1 mL Place 1 spray into the nose once.     . nitroGLYCERIN (NITROSTAT) 0.4 MG SL tablet Place 0.4 mg under the tongue every 5 (five) minutes as needed for chest pain.  Results for orders placed or performed during the hospital encounter of 12/21/17 (from the past 48 hour(s))  I-STAT, chem 8     Status: None   Collection Time: 12/21/17 10:31 AM  Result Value Ref Range   Sodium 143 135 - 145 mmol/L   Potassium 3.7 3.5 - 5.1 mmol/L   Chloride 99 98 - 111 mmol/L   BUN 17 8 - 23 mg/dL   Creatinine, Ser 9.601.20 0.61 - 1.24 mg/dL   Glucose, Bld 77 70 - 99 mg/dL   Calcium, Ion 4.541.19 0.981.15 - 1.40 mmol/L   TCO2 30 22 - 32 mmol/L   Hemoglobin 13.3 13.0 - 17.0 g/dL   HCT 11.939.0 14.739.0 - 82.952.0 %   No results found.  ROS no recent fever, chills, nausea, vomiting or changes in his appetite.  Blood pressure (!) 160/39, pulse 62, temperature 97.9 F (36.6 C), temperature source Oral, resp. rate 20, height 5\' 9"  (1.753 m), weight 116.4 kg, SpO2 98 %. Physical Exam  Well-nourished well-developed overweight male in no apparent distress.  He appears generally unwell.  He is alert and oriented x4.  Mood and affect are normal.  Extraocular motions are intact.  Respirations are unlabored.  Gait is antalgic bilaterally with a cane.  He has bilateral chronic lymphedema with ulcers on both legs.  The right hallux has an ulcer with exposed bone.  The left hallux has a superficial ulcer at the site of his previous amputation.  No lymphadenopathy is noted on the right.  4 out of 5 strength in plantar flexion and dorsiflexion of the ankle.  Sensibility to light touch is diminished at the forefoot.  Assessment/Plan Right hallux diabetic ulcer and osteomyelitis -to the  operating room today for right hallux amputation.  The risks and benefits of the alternative treatment options have been discussed in detail.  The patient wishes to proceed with surgery and specifically understands risks of bleeding, infection, nerve damage, blood clots, need for additional surgery, amputation and death.   Toni ArthursJohn Jany Buckwalter, MD 12/21/2017, 12:03 PM

## 2017-12-21 NOTE — Anesthesia Preprocedure Evaluation (Addendum)
Anesthesia Evaluation  Patient identified by MRN, date of birth, ID band Patient awake    Reviewed: Allergy & Precautions, NPO status , Patient's Chart, lab work & pertinent test results, reviewed documented beta blocker date and time   Airway Mallampati: II  TM Distance: >3 FB Neck ROM: Full    Dental  (+) Dental Advisory Given, Missing, Partial Upper   Pulmonary shortness of breath, sleep apnea , COPD,  COPD inhaler, former smoker, PE   Pulmonary exam normal breath sounds clear to auscultation       Cardiovascular hypertension, Pt. on home beta blockers and Pt. on medications + CAD, + Cardiac Stents, + Peripheral Vascular Disease, +CHF and + DVT  Normal cardiovascular exam Rhythm:Regular Rate:Normal  Mod-severe pulmonary hypertension  Myoview 07/2016: Nuclear stress EF: 52%. Small inferoapical defect that improves during recovery. Overall does not appear to represent significant ischemia. May represent soft tissue attenuation Ovreall low risk scan   Neuro/Psych PSYCHIATRIC DISORDERS Depression negative neurological ROS     GI/Hepatic negative GI ROS, Neg liver ROS,   Endo/Other  diabetes, Type 2, Insulin DependentObesity   Renal/GU Renal InsufficiencyRenal disease     Musculoskeletal  (+) Arthritis ,   Abdominal   Peds  Hematology  (+) Blood dyscrasia (Plavix), anemia ,   Anesthesia Other Findings Day of surgery medications reviewed with the patient.  Reproductive/Obstetrics                            Anesthesia Physical  Anesthesia Plan  ASA: III  Anesthesia Plan: MAC   Post-op Pain Management:    Induction: Intravenous  PONV Risk Score and Plan: 1 and Propofol infusion and Treatment may vary due to age or medical condition  Airway Management Planned: Nasal Cannula and Natural Airway  Additional Equipment:   Intra-op Plan:   Post-operative Plan:   Informed Consent: I  have reviewed the patients History and Physical, chart, labs and discussed the procedure including the risks, benefits and alternatives for the proposed anesthesia with the patient or authorized representative who has indicated his/her understanding and acceptance.   Dental advisory given  Plan Discussed with: CRNA and Anesthesiologist  Anesthesia Plan Comments:         Anesthesia Quick Evaluation

## 2017-12-21 NOTE — Discharge Instructions (Addendum)
Toni ArthursJohn Hewitt, MD [facility]  Please read the following information regarding your care after surgery.  Medications  You only need a prescription for the narcotic pain medicine (ex. oxycodone, Percocet, Norco).  All of the other medicines listed below are available over the counter. X Aleve 2 pills twice a day for the first 3 days after surgery. X acetominophen (Tylenol) 650 mg every 4-6 hours as you need for minor to moderate pain  X To help prevent blood clots, resume your blood thinners (coumadin, lovenox, and plavix) tomorrow.  You should also get up every hour while you are awake to move around.    Weight Bearing X Bear weight only on your operated foot in the post-op shoe.   Cast / Splint / Dressing X Keep your splint, cast or dressing clean and dry.  Dont put anything (coat hanger, pencil, etc) down inside of it.  If it gets damp, use a hair dryer on the cool setting to dry it.  If it gets soaked, call the office to schedule an appointment for a cast change.   After your dressing, cast or splint is removed; you may shower, but do not soak or scrub the wound.  Allow the water to run over it, and then gently pat it dry.  Swelling It is normal for you to have swelling where you had surgery.  To reduce swelling and pain, keep your toes above your nose for at least 3 days after surgery.  It may be necessary to keep your foot or leg elevated for several weeks.  If it hurts, it should be elevated.  Follow Up Call my office at [phone] when you are discharged from the hospital or surgery center to schedule an appointment to be seen two weeks after surgery.  Call my office at [phone] if you develop a fever >101.5 F, nausea, vomiting, bleeding from the surgical site or severe pain.        Post Anesthesia Home Care Instructions  Activity: Get plenty of rest for the remainder of the day. A responsible individual must stay with you for 24 hours following the  procedure.  For the next 24 hours, DO NOT: -Drive a car -Advertising copywriterperate machinery -Drink alcoholic beverages -Take any medication unless instructed by your physician -Make any legal decisions or sign important papers.  Meals: Start with liquid foods such as gelatin or soup. Progress to regular foods as tolerated. Avoid greasy, spicy, heavy foods. If nausea and/or vomiting occur, drink only clear liquids until the nausea and/or vomiting subsides. Call your physician if vomiting continues.  Special Instructions/Symptoms: Your throat may feel dry or sore from the anesthesia or the breathing tube placed in your throat during surgery. If this causes discomfort, gargle with warm salt water. The discomfort should disappear within 24 hours.  If you had a scopolamine patch placed behind your ear for the management of post- operative nausea and/or vomiting:  1. The medication in the patch is effective for 72 hours, after which it should be removed.  Wrap patch in a tissue and discard in the trash. Wash hands thoroughly with soap and water. 2. You may remove the patch earlier than 72 hours if you experience unpleasant side effects which may include dry mouth, dizziness or visual disturbances. 3. Avoid touching the patch. Wash your hands with soap and water after contact with the patch.

## 2017-12-21 NOTE — Anesthesia Postprocedure Evaluation (Signed)
Anesthesia Post Note  Patient: Paul Chen  Procedure(s) Performed: Right hallux amputation (Right Foot)     Patient location during evaluation: PACU Anesthesia Type: MAC Level of consciousness: awake and alert, awake and oriented Pain management: pain level controlled Vital Signs Assessment: post-procedure vital signs reviewed and stable Respiratory status: spontaneous breathing, nonlabored ventilation and respiratory function stable Cardiovascular status: stable and blood pressure returned to baseline Postop Assessment: no apparent nausea or vomiting Anesthetic complications: no    Last Vitals:  Vitals:   12/21/17 1445 12/21/17 1500  BP: (!) 103/48 (!) 115/41  Pulse: (!) 59 (!) 54  Resp: 14 13  Temp:    SpO2: 100% 99%    Last Pain:  Vitals:   12/21/17 1500  TempSrc:   PainSc: 0-No pain                 Catalina Gravel

## 2017-12-22 ENCOUNTER — Encounter (HOSPITAL_BASED_OUTPATIENT_CLINIC_OR_DEPARTMENT_OTHER): Payer: Self-pay | Admitting: Orthopedic Surgery

## 2018-01-04 ENCOUNTER — Encounter (HOSPITAL_BASED_OUTPATIENT_CLINIC_OR_DEPARTMENT_OTHER): Payer: Self-pay | Admitting: Orthopedic Surgery

## 2018-01-04 NOTE — Addendum Note (Signed)
Addendum  created 01/04/18 1152 by Cecile Hearingurk, Jcion Buddenhagen Edward, MD   Intraprocedure Event edited, Intraprocedure Staff edited

## 2018-01-12 ENCOUNTER — Encounter: Payer: Self-pay | Admitting: Vascular Surgery

## 2018-01-12 ENCOUNTER — Other Ambulatory Visit: Payer: Self-pay | Admitting: *Deleted

## 2018-01-12 ENCOUNTER — Other Ambulatory Visit: Payer: Self-pay

## 2018-01-12 ENCOUNTER — Ambulatory Visit (HOSPITAL_COMMUNITY)
Admission: RE | Admit: 2018-01-12 | Discharge: 2018-01-12 | Disposition: A | Discharge status: Home / Self Care | Payer: Medicare HMO | Source: Ambulatory Visit | Attending: Vascular Surgery | Admitting: Vascular Surgery

## 2018-01-12 ENCOUNTER — Ambulatory Visit (INDEPENDENT_AMBULATORY_CARE_PROVIDER_SITE_OTHER): Payer: Medicare HMO | Admitting: Vascular Surgery

## 2018-01-12 ENCOUNTER — Encounter: Payer: Self-pay | Admitting: *Deleted

## 2018-01-12 ENCOUNTER — Ambulatory Visit (INDEPENDENT_AMBULATORY_CARE_PROVIDER_SITE_OTHER)
Admission: RE | Admit: 2018-01-12 | Discharge: 2018-01-12 | Disposition: A | Discharge status: Home / Self Care | Payer: Medicare HMO | Source: Ambulatory Visit | Attending: Vascular Surgery | Admitting: Vascular Surgery

## 2018-01-12 VITALS — BP 130/60 | HR 58 | Temp 98.1°F | Resp 18 | Ht 69.0 in | Wt 245.0 lb

## 2018-01-12 DIAGNOSIS — I7025 Atherosclerosis of native arteries of other extremities with ulceration: Secondary | ICD-10-CM

## 2018-01-12 DIAGNOSIS — I872 Venous insufficiency (chronic) (peripheral): Secondary | ICD-10-CM

## 2018-01-12 DIAGNOSIS — L98499 Non-pressure chronic ulcer of skin of other sites with unspecified severity: Secondary | ICD-10-CM | POA: Diagnosis present

## 2018-01-12 MED ORDER — ENOXAPARIN SODIUM 120 MG/0.8ML ~~LOC~~ SOLN: 120.0000 mg | Freq: Two times a day (BID) | SUBCUTANEOUS | 0 refills | Status: DC

## 2018-01-12 NOTE — Progress Notes (Signed)
Patient ID: Paul Chen, male   DOB: May 11, 1940, 77 y.o.   MRN: [id number]  Reason for Consult: PVD   Referred by Carmin Richmond, MD  Subjective:     HPI:  Paul Chen is a 77 y.o. male is undergone bilateral lower extremity intervention including SFA stenting on the right.  He has had great toe amputation of the left which is healed.  He more recently has a right partial great toe amputation which is healing he has sutures in place.  He is here today with lower extremity vascular studies.  We have previously evaluated his veins thought this to be unlikely the cause of his disease.  He does walk.  He lives with his sister who takes care of him.  He is on Coumadin and has chronic renal insufficiency.  Not having any fevers or chills at this time.  Past Medical History:  Diagnosis Date  . Arthritis   . Chronic renal insufficiency   . CKD (chronic kidney disease), stage III (HCC)   . COPD (chronic obstructive pulmonary disease) (HCC)    with restriction  . Coronary disease October 2010   Status post stenting of the proximal to mid right coronary with DES.  Status post extensive stenting of the proximal right coronary to the crux.  He has moderate disease in  the left coronary system  . Depression   . Diabetes mellitus    Insulin Dependent  . Diastolic dysfunction    Congestive heart failure with  . Dyspnea    multifactorial. chronic, 11/18/9- not an issue at this time  . Gout   . History of blood transfusion   . Hypertension   . Hypoventilation    syndrome  . Obesity   . Pneumonia   . Pulmonary embolism [facility]) March 2010   hx of and DVT in March of 2010 on chronic Coumadin therapy  . Pulmonary hypertension (HCC)    Moderate to severe  . Retroperitoneal bleed    Hx of right pelvic  . Sleep apnea    Obstructive , no CPAP   Family History  Problem Relation Age of Onset  . Heart attack Mother   . Coronary artery disease Mother   . Hypertension Mother   . Coronary  artery disease Father   . Heart attack Father    Past Surgical History:  Procedure Laterality Date  . AMPUTATION TOE Left 05/04/2017   Procedure: Left hallux amputation;  Surgeon: Toni Arthurs, MD;  Location: [facility];  Service: Orthopedics;  Laterality: Left;  . AMPUTATION TOE Right 12/21/2017   Procedure: Right hallux amputation;  Surgeon: Toni Arthurs, MD;  Location: [facility];  Service: Orthopedics;  Laterality: Right;   . BACK SURGERY  2000   post  . CARDIOVASCULAR STRESS TEST  11-14-2008   EF 44%  . EXPLORATORY LAPAROTOMY  1972   POST  . LOWER EXTREMITY ANGIOGRAPHY N/A 05/01/2017   Procedure: LOWER EXTREMITY ANGIOGRAPHY;  Surgeon: Maeola Harman, MD;  Location: [facility] INVASIVE CV LAB;  Service: Cardiovascular;  Laterality: N/A;  . LOWER EXTREMITY ANGIOGRAPHY N/A 05/15/2017   Procedure: LOWER EXTREMITY ANGIOGRAPHY;  Surgeon: Maeola Harman, MD;  Location: [facility] INVASIVE CV LAB;  Service: Cardiovascular;  Laterality: N/A;  . PERIPHERAL VASCULAR ATHERECTOMY  05/01/2017   Procedure: PERIPHERAL VASCULAR ATHERECTOMY;  Surgeon: Maeola Harman, MD;  Location: [facility] INVASIVE CV LAB;  Service: Cardiovascular;;  Left SFA  . PERIPHERAL VASCULAR BALLOON ANGIOPLASTY  05/01/2017  Procedure: PERIPHERAL VASCULAR BALLOON ANGIOPLASTY;  Surgeon: Maeola Harman, MD;  Location: [facility] INVASIVE CV LAB;  Service: Cardiovascular;;  Left Anterial Tibial Artery  . PERIPHERAL VASCULAR INTERVENTION Right 05/15/2017   Procedure: PERIPHERAL VASCULAR INTERVENTION;  Surgeon: Maeola Harman, MD;  Location: [facility] INVASIVE CV LAB;  Service: Cardiovascular;  Laterality: Right;  SFA Stent  . US ECHOCARDIOGRAPHY  11-25-2008   EF 55-60%    Short Social History:  Social History   Tobacco Use  . Smoking status: Former Smoker    Years: 2.00    Last attempt to quit: 02/01/1980    Years since quitting: 37.9  . Smokeless tobacco: Never Used  Substance  Use Topics  . Alcohol use: Yes    Comment: Wine Occasionally    No Known Allergies  Current Outpatient Medications  Medication Sig Dispense Refill  . albuterol (PROVENTIL) (2.5 MG/3ML) 0.083% nebulizer solution Take 2.5 mg by nebulization every 4 (four) hours as needed for wheezing or shortness of breath.     . allopurinol (ZYLOPRIM) 300 MG tablet Take 300 mg by mouth daily.   11  . amLODipine (NORVASC) 5 MG tablet Take 1 tablet (5 mg total) by mouth daily. 90 tablet 3  . atorvastatin (LIPITOR) 40 MG tablet Take 1 tablet (40 mg total) by mouth daily. 90 tablet 3  . busPIRone (BUSPAR) 15 MG tablet Take 15 mg by mouth 2 (two) times daily.     . ciclopirox (PENLAC) 8 % solution Apply 1 application topically at bedtime. Apply over nail and surrounding skin. Apply daily over previous coat. After seven (7) days, may remove with alcohol and continue cycle.    . clopidogrel (PLAVIX) 75 MG tablet TAKE ONE TABLET BY MOUTH EVERY MORNING 30 tablet 11  . Coenzyme Q10 (COQ10) 100 MG CAPS Take 100 mg by mouth every evening.    Marland Kitchen doxazosin (CARDURA) 4 MG tablet Take 4 mg by mouth every evening.     . DULoxetine (CYMBALTA) 60 MG capsule Take 60 mg by mouth daily.      . EMBEDA 60-2.4 MG CPCR Take 2 capsules by mouth daily.  0  . FLOVENT HFA 44 MCG/ACT inhaler Inhale 3 puffs into the lungs 2 (two) times daily.     . furosemide (LASIX) 20 MG tablet Take 40 mg by mouth daily.   11  . gabapentin (NEURONTIN) 300 MG capsule Take 600 mg by mouth 2 (two) times daily.    Marland Kitchen glucagon 1 MG injection Inject 1 mg into the vein once as needed (for low blood sugars.).     Marland Kitchen ipratropium (ATROVENT) 0.02 % nebulizer solution Take 0.5 mg by nebulization every 4 (four) hours as needed for wheezing or shortness of breath.     Marland Kitchen LANTUS SOLOSTAR 100 UNIT/ML Solostar Pen Inject 40 Units into the skin daily.   11  . lisinopril (PRINIVIL,ZESTRIL) 10 MG tablet Take 10 mg by mouth daily.  3  . Magnesium Oxide 400 (240 Mg) MG TABS Take  400 mg by mouth 2 (two) times daily.  5  . metoprolol succinate (TOPROL-XL) 100 MG 24 hr tablet Take 150 mg by mouth daily.     . Multiple Vitamins-Minerals (CENTRUM SILVER ULTRA MENS PO) Take 1 tablet by mouth daily.  99  . naloxone (NARCAN) nasal spray 4 mg/0.1 mL Place 1 spray into the nose once.     . nitroGLYCERIN (NITROSTAT) 0.4 MG SL tablet Place 0.4 mg under the tongue every 5 (five) minutes as needed for  chest pain.     Marland Kitchen NOVOLOG FLEXPEN 100 UNIT/ML FlexPen Inject 8 Units into the skin 2 (two) times daily with a meal.  11  . Omega-3 Fatty Acids (FISH OIL PO) Take 1 capsule by mouth 2 (two) times daily.     Marland Kitchen oxyCODONE (ROXICODONE) 15 MG immediate release tablet Take 15 mg by mouth 4 (four) times daily.     . silver sulfADIAZINE (SILVADENE) 1 % cream Apply 1 application topically daily as needed (apply to toes).    Marland Kitchen VICTOZA 18 MG/3ML SOPN Inject 1.8 mg into the skin daily.     Marland Kitchen warfarin (COUMADIN) 2.5 MG tablet Take 2.5 mg by mouth daily.   5  . enoxaparin (LOVENOX) 120 MG/0.8ML injection Inject 0.8 mLs (120 mg total) into the skin every 12 (twelve) hours for 3 days. 4.8 mL 0   No current facility-administered medications for this visit.     Review of Systems  Constitutional:  Constitutional negative. HENT: HENT negative.  Eyes: Eyes negative.  Skin: Positive for wound.  Neurological: Neurological negative. Psychiatric: Psychiatric negative.        Objective:  Objective   Vitals:   01/12/18 1332  BP: 130/60  Pulse: (!) 58  Resp: 18  Temp: 98.1 F (36.7 C)  TempSrc: Oral  SpO2: 91%  Weight: 245 lb (111.1 kg)  Height: 5\' 9"  (1.753 m)   Body mass index is 36.18 kg/m.  Physical Exam Eyes:     Pupils: Pupils are equal, round, and reactive to light.  Neck:     Musculoskeletal: Normal range of motion.  Cardiovascular:     Rate and Rhythm: Normal rate.  Pulmonary:     Effort: Pulmonary effort is normal.  Abdominal:     Palpations: Abdomen is soft.    Musculoskeletal:        General: Swelling present.     Right lower leg: Edema present.     Left lower leg: Edema present.     Comments: Right great toe amputation site with sutures in place  Skin:    Capillary Refill: Capillary refill takes 2 to 3 seconds.  Neurological:     General: No focal deficit present.     Mental Status: He is alert.  Psychiatric:        Mood and Affect: Mood normal.        Behavior: Behavior normal.        Thought Content: Thought content normal.        Judgment: Judgment normal.     Data: ABI 0.4 on the right and 0.48 on the left.     There is apparent 50 to 99% stenosis in his bilateral SFA stents  Assessment/Plan:     77 year old male with bilateral lower extremity endovascular interventions with stenting in his bilateral SFAs.  This was for bilateral toe ulcerations from which she is healed a left partial great toe amputation.  He has more recently undergone a great toe amputation on the right which has not healed perfectly.  I discussed with him that he does not really have the blood flow at this time to heal this well with an ABI dropped from 0.6 down to 0.4 since May.  We will plan for angiogram possible invention was right lower extremity stent and attempt to get this toe amputation to heal and prevent further amputation.  We would not intervene on the left given that his amputation is healed.  Patient takes Coumadin and we will bridge with Lovenox  over the weekend.     Maeola HarmanBrandon Christopher Arnoldo Hildreth MD [facility]

## 2018-01-15 ENCOUNTER — Other Ambulatory Visit: Payer: Self-pay

## 2018-01-15 ENCOUNTER — Encounter (HOSPITAL_COMMUNITY)
Admission: RE | Disposition: A | Discharge status: Home / Self Care | Payer: Self-pay | Source: Home / Self Care | Attending: Vascular Surgery

## 2018-01-15 ENCOUNTER — Ambulatory Visit (HOSPITAL_COMMUNITY)
Admission: RE | Admit: 2018-01-15 | Discharge: 2018-01-15 | Disposition: A | Discharge status: Home / Self Care | Payer: Medicare HMO | Attending: Vascular Surgery | Admitting: Vascular Surgery

## 2018-01-15 DIAGNOSIS — Z8249 Family history of ischemic heart disease and other diseases of the circulatory system: Secondary | ICD-10-CM | POA: Insufficient documentation

## 2018-01-15 DIAGNOSIS — E1122 Type 2 diabetes mellitus with diabetic chronic kidney disease: Secondary | ICD-10-CM | POA: Insufficient documentation

## 2018-01-15 DIAGNOSIS — I5032 Chronic diastolic (congestive) heart failure: Secondary | ICD-10-CM | POA: Diagnosis not present

## 2018-01-15 DIAGNOSIS — N183 Chronic kidney disease, stage 3 (moderate): Secondary | ICD-10-CM | POA: Diagnosis not present

## 2018-01-15 DIAGNOSIS — I998 Other disorder of circulatory system: Secondary | ICD-10-CM | POA: Insufficient documentation

## 2018-01-15 DIAGNOSIS — I872 Venous insufficiency (chronic) (peripheral): Secondary | ICD-10-CM | POA: Diagnosis not present

## 2018-01-15 DIAGNOSIS — Z7901 Long term (current) use of anticoagulants: Secondary | ICD-10-CM | POA: Insufficient documentation

## 2018-01-15 DIAGNOSIS — E669 Obesity, unspecified: Secondary | ICD-10-CM | POA: Insufficient documentation

## 2018-01-15 DIAGNOSIS — J449 Chronic obstructive pulmonary disease, unspecified: Secondary | ICD-10-CM | POA: Insufficient documentation

## 2018-01-15 DIAGNOSIS — L97519 Non-pressure chronic ulcer of other part of right foot with unspecified severity: Secondary | ICD-10-CM | POA: Insufficient documentation

## 2018-01-15 DIAGNOSIS — I130 Hypertensive heart and chronic kidney disease with heart failure and stage 1 through stage 4 chronic kidney disease, or unspecified chronic kidney disease: Secondary | ICD-10-CM | POA: Diagnosis not present

## 2018-01-15 DIAGNOSIS — Y831 Surgical operation with implant of artificial internal device as the cause of abnormal reaction of the patient, or of later complication, without mention of misadventure at the time of the procedure: Secondary | ICD-10-CM | POA: Insufficient documentation

## 2018-01-15 DIAGNOSIS — Z6836 Body mass index (BMI) 36.0-36.9, adult: Secondary | ICD-10-CM | POA: Insufficient documentation

## 2018-01-15 DIAGNOSIS — G4733 Obstructive sleep apnea (adult) (pediatric): Secondary | ICD-10-CM | POA: Insufficient documentation

## 2018-01-15 DIAGNOSIS — Z7902 Long term (current) use of antithrombotics/antiplatelets: Secondary | ICD-10-CM | POA: Insufficient documentation

## 2018-01-15 DIAGNOSIS — T82858A Stenosis of vascular prosthetic devices, implants and grafts, initial encounter: Secondary | ICD-10-CM | POA: Insufficient documentation

## 2018-01-15 DIAGNOSIS — Z86711 Personal history of pulmonary embolism: Secondary | ICD-10-CM | POA: Insufficient documentation

## 2018-01-15 DIAGNOSIS — Z794 Long term (current) use of insulin: Secondary | ICD-10-CM | POA: Insufficient documentation

## 2018-01-15 DIAGNOSIS — Z992 Dependence on renal dialysis: Secondary | ICD-10-CM | POA: Insufficient documentation

## 2018-01-15 DIAGNOSIS — Z95820 Peripheral vascular angioplasty status with implants and grafts: Secondary | ICD-10-CM | POA: Insufficient documentation

## 2018-01-15 DIAGNOSIS — Z87891 Personal history of nicotine dependence: Secondary | ICD-10-CM | POA: Insufficient documentation

## 2018-01-15 DIAGNOSIS — Z955 Presence of coronary angioplasty implant and graft: Secondary | ICD-10-CM | POA: Insufficient documentation

## 2018-01-15 DIAGNOSIS — I7025 Atherosclerosis of native arteries of other extremities with ulceration: Secondary | ICD-10-CM | POA: Diagnosis not present

## 2018-01-15 DIAGNOSIS — Z89412 Acquired absence of left great toe: Secondary | ICD-10-CM | POA: Insufficient documentation

## 2018-01-15 DIAGNOSIS — I70235 Atherosclerosis of native arteries of right leg with ulceration of other part of foot: Secondary | ICD-10-CM | POA: Diagnosis not present

## 2018-01-15 DIAGNOSIS — Z89411 Acquired absence of right great toe: Secondary | ICD-10-CM | POA: Insufficient documentation

## 2018-01-15 DIAGNOSIS — Z79899 Other long term (current) drug therapy: Secondary | ICD-10-CM | POA: Insufficient documentation

## 2018-01-15 HISTORY — PX: PERIPHERAL VASCULAR BALLOON ANGIOPLASTY: CATH118281

## 2018-01-15 HISTORY — PX: ABDOMINAL AORTOGRAM W/LOWER EXTREMITY: CATH118223

## 2018-01-15 LAB — PROTIME-INR
INR: 1.32
Prothrombin Time: 16.3 seconds — ABNORMAL HIGH (ref 11.4–15.2)

## 2018-01-15 LAB — POCT I-STAT, CHEM 8
BUN: 17 mg/dL (ref 8–23)
Calcium, Ion: 1.09 mmol/L — ABNORMAL LOW (ref 1.15–1.40)
Chloride: 102 mmol/L (ref 98–111)
Creatinine, Ser: 1.3 mg/dL — ABNORMAL HIGH (ref 0.61–1.24)
Glucose, Bld: 164 mg/dL — ABNORMAL HIGH (ref 70–99)
HCT: 37 % — ABNORMAL LOW (ref 39.0–52.0)
Hemoglobin: 12.6 g/dL — ABNORMAL LOW (ref 13.0–17.0)
Potassium: 3.5 mmol/L (ref 3.5–5.1)
Sodium: 144 mmol/L (ref 135–145)
TCO2: 33 mmol/L — ABNORMAL HIGH (ref 22–32)

## 2018-01-15 LAB — GLUCOSE, CAPILLARY
Glucose-Capillary: 214 mg/dL — ABNORMAL HIGH (ref 70–99)
Glucose-Capillary: 121 mg/dL — ABNORMAL HIGH (ref 70–99)
Glucose-Capillary: 27 mg/dL — CL (ref 70–99)
Glucose-Capillary: 104 mg/dL — ABNORMAL HIGH (ref 70–99)

## 2018-01-15 LAB — POCT ACTIVATED CLOTTING TIME: Activated Clotting Time: 323 seconds

## 2018-01-15 SURGERY — ABDOMINAL AORTOGRAM W/LOWER EXTREMITY
Anesthesia: LOCAL | Laterality: Right

## 2018-01-15 MED ORDER — LABETALOL HCL 5 MG/ML IV SOLN: 10.0000 mg | INTRAVENOUS | Status: DC | PRN

## 2018-01-15 MED ORDER — ONDANSETRON HCL 4 MG/2ML IJ SOLN: 4.0000 mg | Freq: Four times a day (QID) | INTRAMUSCULAR | Status: DC | PRN

## 2018-01-15 MED ORDER — FENTANYL CITRATE (PF) 100 MCG/2ML IJ SOLN
INTRAMUSCULAR | Status: AC
  Filled 2018-01-15: qty 2

## 2018-01-15 MED ORDER — SODIUM CHLORIDE 0.9% FLUSH: 3.0000 mL | Freq: Two times a day (BID) | INTRAVENOUS | Status: DC

## 2018-01-15 MED ORDER — HEPARIN SODIUM (PORCINE) 1000 UNIT/ML IJ SOLN
INTRAMUSCULAR | Status: DC | PRN
  Administered 2018-01-15: 11000 [IU] via INTRAVENOUS

## 2018-01-15 MED ORDER — HEPARIN SODIUM (PORCINE) 1000 UNIT/ML IJ SOLN
INTRAMUSCULAR | Status: AC
  Filled 2018-01-15: qty 1

## 2018-01-15 MED ORDER — HEPARIN (PORCINE) IN NACL 1000-0.9 UT/500ML-% IV SOLN
INTRAVENOUS | Status: DC | PRN
  Administered 2018-01-15 (×2): 500 mL

## 2018-01-15 MED ORDER — LIDOCAINE HCL (PF) 1 % IJ SOLN
INTRAMUSCULAR | Status: AC
  Filled 2018-01-15: qty 30

## 2018-01-15 MED ORDER — DEXTROSE 50 % IV SOLN
INTRAVENOUS | Status: AC
  Filled 2018-01-15: qty 50

## 2018-01-15 MED ORDER — ACETAMINOPHEN 325 MG PO TABS: 650.0000 mg | ORAL_TABLET | ORAL | Status: DC | PRN

## 2018-01-15 MED ORDER — FENTANYL CITRATE (PF) 100 MCG/2ML IJ SOLN
INTRAMUSCULAR | Status: DC | PRN
  Administered 2018-01-15 (×2): 50 ug via INTRAVENOUS

## 2018-01-15 MED ORDER — OXYCODONE HCL 5 MG PO TABS: 5.0000 mg | ORAL_TABLET | ORAL | Status: DC | PRN

## 2018-01-15 MED ORDER — SODIUM CHLORIDE 0.9% FLUSH: 3.0000 mL | INTRAVENOUS | Status: DC | PRN

## 2018-01-15 MED ORDER — HEPARIN (PORCINE) IN NACL 1000-0.9 UT/500ML-% IV SOLN
INTRAVENOUS | Status: AC
  Filled 2018-01-15: qty 1000

## 2018-01-15 MED ORDER — LIDOCAINE HCL (PF) 1 % IJ SOLN
INTRAMUSCULAR | Status: DC | PRN
  Administered 2018-01-15: 2 mL via INTRADERMAL
  Administered 2018-01-15: 15 mL via INTRADERMAL

## 2018-01-15 MED ORDER — DEXTROSE 50 % IV SOLN
INTRAVENOUS | Status: DC | PRN
  Administered 2018-01-15: 1 via INTRAVENOUS

## 2018-01-15 MED ORDER — VIPERSLIDE LUBRICANT OPTIME
TOPICAL | Status: DC | PRN
  Administered 2018-01-15: 20 mL via SURGICAL_CAVITY

## 2018-01-15 MED ORDER — IODIXANOL 320 MG/ML IV SOLN
INTRAVENOUS | Status: DC | PRN
  Administered 2018-01-15: 165 mL via INTRA_ARTERIAL

## 2018-01-15 MED ORDER — SODIUM CHLORIDE 0.9 % WEIGHT BASED INFUSION: 1.0000 mL/kg/h | INTRAVENOUS | Status: DC

## 2018-01-15 MED ORDER — HYDRALAZINE HCL 20 MG/ML IJ SOLN: 5.0000 mg | INTRAMUSCULAR | Status: DC | PRN

## 2018-01-15 MED ORDER — MIDAZOLAM HCL 2 MG/2ML IJ SOLN
INTRAMUSCULAR | Status: DC | PRN
  Administered 2018-01-15 (×2): 0.5 mg via INTRAVENOUS

## 2018-01-15 MED ORDER — MIDAZOLAM HCL 2 MG/2ML IJ SOLN
INTRAMUSCULAR | Status: AC
  Filled 2018-01-15: qty 2

## 2018-01-15 MED ORDER — SODIUM CHLORIDE 0.9 % IV SOLN
INTRAVENOUS | Status: DC
  Administered 2018-01-15: 11:00:00 via INTRAVENOUS

## 2018-01-15 MED ORDER — SODIUM CHLORIDE 0.9 % IV SOLN: 250.0000 mL | INTRAVENOUS | Status: DC | PRN

## 2018-01-15 SURGICAL SUPPLY — 33 items
BAG SNAP BAND KOVER 36X36 (MISCELLANEOUS) ×1 IMPLANT
BALLN ADMIRAL INPACT 5X250 (BALLOONS) ×2
BALLN STERLING OTW 5X220X150 (BALLOONS) ×2
BALLOON ADMIRAL INPACT 5X250 (BALLOONS) IMPLANT
BALLOON STERLING OTW 5X220X150 (BALLOONS) IMPLANT
BUR JETSTREAM XC 2.4/3.4 (BURR) IMPLANT
BURR JETSTREAM XC 2.4/3.4 (BURR) ×2
CATH ANGIO 5F BER2 65CM (CATHETERS) ×1 IMPLANT
CATH CROSS OVER TEMPO 5F (CATHETERS) ×1 IMPLANT
CATH OMNI FLUSH 5F 65CM (CATHETERS) ×1 IMPLANT
CATH QUICKCROSS SUPP .035X90CM (MICROCATHETER) ×1 IMPLANT
CLOSURE MYNX CONTROL 6F/7F (Vascular Products) ×1 IMPLANT
COVER DOME SNAP 22 D (MISCELLANEOUS) ×1 IMPLANT
DEVICE EMBOSHIELD NAV6 4.0-7.0 (FILTER) ×1 IMPLANT
DEVICE ONE SNARE 10MM (MISCELLANEOUS) ×1 IMPLANT
DEVICE TORQUE .025-.038 (MISCELLANEOUS) ×1 IMPLANT
GUIDEWIRE ANGLED .035X260CM (WIRE) ×1 IMPLANT
KIT ENCORE 26 ADVANTAGE (KITS) ×1 IMPLANT
KIT MICROPUNCTURE NIT STIFF (SHEATH) ×1 IMPLANT
KIT PV (KITS) ×2 IMPLANT
LUBRICANT VIPERSLIDE CORONARY (MISCELLANEOUS) ×1 IMPLANT
PATCH THROMBIX TOPICAL PLAIN (HEMOSTASIS) ×1 IMPLANT
SHEATH FLEXOR ANSEL 1 7F 45CM (SHEATH) ×1 IMPLANT
SHEATH MICROPUNCTURE PEDAL 4FR (SHEATH) ×1 IMPLANT
SHEATH PINNACLE 5F 10CM (SHEATH) ×1 IMPLANT
SHEATH PINNACLE 7F 10CM (SHEATH) ×1 IMPLANT
SHIELD RADPAD SCOOP 12X17 (MISCELLANEOUS) ×1 IMPLANT
SYR MEDRAD MARK 7 150ML (SYRINGE) ×2 IMPLANT
TRANSDUCER W/STOPCOCK (MISCELLANEOUS) ×2 IMPLANT
TRAY PV CATH (CUSTOM PROCEDURE TRAY) ×2 IMPLANT
WIRE BAREWIRE WORK .014X315CM (WIRE) ×1 IMPLANT
WIRE BENTSON .035X145CM (WIRE) ×1 IMPLANT
WIRE G V18X300CM (WIRE) ×1 IMPLANT

## 2018-01-15 NOTE — Discharge Instructions (Signed)

## 2018-01-15 NOTE — H&P (Signed)
   History and Physical Update  The patient was interviewed and re-examined.  The patient's previous History and Physical has been reviewed and is unchanged recent office visit. Plan for angiogram to eval and treat rle.   Aaryav Hopfensperger C. Randie Heinzain, MD [facility] Office: [phone] Pager: [phone]  01/15/2018, 11:51 AM

## 2018-01-15 NOTE — Op Note (Signed)
Patient name: Paul Chen MRN: [id number] DOB: 04/12/1940 Sex: male  01/15/2018 Pre-operative Diagnosis: critical right lower extremity ischemia Post-operative diagnosis:  Same Surgeon:  Apolinar Junes C. Randie Heinz, MD Procedure Performed: 1.  Ultrasound-guided cannulation left common femoral artery 2.  Aortogram and right lower extremity angiogram 3.  Ultrasound-guided cannulation right posterior tibial artery 4.  Jetstream atherectomy of right SFA in-stent occlusion and stenosis 5.  Drug-coated balloon angioplasty of right SFA stents with 5 x 250 mm in.pact Admiral 6.  Minx device closure left common femoral artery 7.  Moderate sedation with fentanyl and Versed 407 minutes  Indications: 77 year old male with history of bilateral lower extremity interventions.  He has recently undergone right great toe partial amputation which does not appear to be healing well and he has at least 50% stenosis in his stents.  He is indicated for angiogram possible intervention.  Findings: The right SFA stents were occluded at their takeoff.  We had to access the posterior tibial artery on the right to get retrograde through and through access.  There were multiple greater than 50% stenosis throughout the stents.  After atherectomy and drug-coated balloon angioplasty there is no further residual stenosis within the stents and there is three-vessel runoff with a dominant vessel being the posterior tibial to the level of the foot.   Procedure:  The patient was identified in the holding area and taken to room 8.  The patient was then placed supine on the table and prepped and draped in the usual sterile fashion.  A time out was called.  Ultrasound was used to evaluate the left common femoral artery which was noted to be patent.  The area was anesthetized 1% lidocaine cannulated with direct ultrasound guidance a micropuncture needle followed by wire and sheath.  An image was saved to the permanent record.  Placed an Omni  Flush catheter to the level of the L1 vertebrae and aortogram was performed.  I then attempted to cross the bifurcation with Omni flush had to use crossover catheter and Glidewire.  We then placed the crossover catheter into the right common femoral artery perform right lower extremity angiogram which demonstrated our occlusion of stents.  We exchanged for a long 7 [place] sheath patient was fully heparinized and ACT returned greater than 300.  I attempted to get into the stents using combination of quick cross and bare catheters as well as V 18 and glide wires but could not.  With this I made the determination to cannulate the posterior tibial artery at the ankle.  The right foot was prepped ultrasound was used we cannulated the posterior tibial artery and a micropuncture sheath was placed.  We used ultrasound guidance and image was saved the permanent record.  We then exchanged for a quick cross catheter and Glidewire were able to traverse this dense easily in a retrograde fashion we snared the common femoral artery and established through and through access.  We then placed a quick cross catheter from above and placed on a 1 for bare wire into the tibioperoneal trunk distally and deployed a nav 6 filter.  We then performed jetstream atherectomy of the entire SFA and stents and then performed balloon angioplasty with a drug-coated balloon of the right SFA stents.  Completion demonstrated patency with no residual stenosis.  We retrieved our filter which was intact.  We then exchanged for a short 7 French sheath and deployed a minx device.  He tolerated this well the immediate complication.  Contrast  165 cc   Tywon Niday C. Randie Heinzain, MD [facility] Office: [phone] Pager: [phone]

## 2018-01-16 ENCOUNTER — Encounter (HOSPITAL_COMMUNITY): Payer: Self-pay | Admitting: Vascular Surgery

## 2018-01-17 ENCOUNTER — Encounter (HOSPITAL_COMMUNITY): Payer: Self-pay | Admitting: Vascular Surgery

## 2018-01-31 ENCOUNTER — Other Ambulatory Visit (HOSPITAL_COMMUNITY): Payer: Self-pay | Admitting: Orthopedic Surgery

## 2018-02-14 ENCOUNTER — Other Ambulatory Visit: Payer: Self-pay

## 2018-02-14 ENCOUNTER — Encounter (HOSPITAL_COMMUNITY): Payer: Self-pay | Admitting: *Deleted

## 2018-02-14 NOTE — Progress Notes (Signed)
Pt denies any acute cardiopulmonary issues. Pt stated that he is under the care of Dr. [place], Cardiology. Pt denies having a cardiac cath. Pt denies having a chest x ray within the last year. Pt stated that last dose of Plavix and Coumadin was " Friday" and last dose of Lovenox will be at HS. Pt made aware to stop taking vitamins, fish oil and herbal medications. Do not take any NSAIDs ie: Ibuprofen, Advil, Naproxen (Aleve), Motrin, BC and Goody Powder. Pt mad aware to check blood glucose (BG) the morning of surgery- if BG > 70 take 20 units of Basaglar insulin. If BG is < 70 take 4 ounces of apple juice, wait 15 minutes after intervention and recheck BG, if BG remains < 70, call Short Stay unit to speak with a nurse. Pt made aware to check BG every 2 hours prior to arrival to hospital.  PT verbalized understanding of all pre-op instructions.

## 2018-02-15 ENCOUNTER — Encounter (HOSPITAL_COMMUNITY)
Admission: RE | Disposition: A | Discharge status: Home / Self Care | Payer: Self-pay | Source: Home / Self Care | Attending: Orthopedic Surgery

## 2018-02-15 ENCOUNTER — Ambulatory Visit (HOSPITAL_COMMUNITY): Payer: Medicare Other | Admitting: Registered Nurse

## 2018-02-15 ENCOUNTER — Other Ambulatory Visit: Payer: Self-pay

## 2018-02-15 ENCOUNTER — Encounter (HOSPITAL_COMMUNITY): Payer: Self-pay

## 2018-02-15 ENCOUNTER — Ambulatory Visit (HOSPITAL_COMMUNITY)
Admission: RE | Admit: 2018-02-15 | Discharge: 2018-02-15 | Disposition: A | Discharge status: Home / Self Care | Payer: Medicare Other | Source: Home / Self Care | Attending: Orthopedic Surgery | Admitting: Orthopedic Surgery

## 2018-02-15 DIAGNOSIS — N183 Chronic kidney disease, stage 3 (moderate): Secondary | ICD-10-CM

## 2018-02-15 DIAGNOSIS — E1151 Type 2 diabetes mellitus with diabetic peripheral angiopathy without gangrene: Secondary | ICD-10-CM

## 2018-02-15 DIAGNOSIS — F329 Major depressive disorder, single episode, unspecified: Secondary | ICD-10-CM | POA: Insufficient documentation

## 2018-02-15 DIAGNOSIS — E11621 Type 2 diabetes mellitus with foot ulcer: Secondary | ICD-10-CM | POA: Insufficient documentation

## 2018-02-15 DIAGNOSIS — Z79899 Other long term (current) drug therapy: Secondary | ICD-10-CM | POA: Insufficient documentation

## 2018-02-15 DIAGNOSIS — J449 Chronic obstructive pulmonary disease, unspecified: Secondary | ICD-10-CM | POA: Insufficient documentation

## 2018-02-15 DIAGNOSIS — Z86718 Personal history of other venous thrombosis and embolism: Secondary | ICD-10-CM

## 2018-02-15 DIAGNOSIS — Z7901 Long term (current) use of anticoagulants: Secondary | ICD-10-CM | POA: Insufficient documentation

## 2018-02-15 DIAGNOSIS — M109 Gout, unspecified: Secondary | ICD-10-CM

## 2018-02-15 DIAGNOSIS — M869 Osteomyelitis, unspecified: Principal | ICD-10-CM

## 2018-02-15 DIAGNOSIS — I272 Pulmonary hypertension, unspecified: Secondary | ICD-10-CM

## 2018-02-15 DIAGNOSIS — Z955 Presence of coronary angioplasty implant and graft: Secondary | ICD-10-CM

## 2018-02-15 DIAGNOSIS — L97516 Non-pressure chronic ulcer of other part of right foot with bone involvement without evidence of necrosis: Secondary | ICD-10-CM

## 2018-02-15 DIAGNOSIS — I509 Heart failure, unspecified: Secondary | ICD-10-CM

## 2018-02-15 DIAGNOSIS — I130 Hypertensive heart and chronic kidney disease with heart failure and stage 1 through stage 4 chronic kidney disease, or unspecified chronic kidney disease: Secondary | ICD-10-CM | POA: Insufficient documentation

## 2018-02-15 DIAGNOSIS — Z86711 Personal history of pulmonary embolism: Secondary | ICD-10-CM

## 2018-02-15 DIAGNOSIS — E1122 Type 2 diabetes mellitus with diabetic chronic kidney disease: Secondary | ICD-10-CM | POA: Insufficient documentation

## 2018-02-15 DIAGNOSIS — Z794 Long term (current) use of insulin: Secondary | ICD-10-CM

## 2018-02-15 DIAGNOSIS — Z87891 Personal history of nicotine dependence: Secondary | ICD-10-CM | POA: Insufficient documentation

## 2018-02-15 DIAGNOSIS — M1990 Unspecified osteoarthritis, unspecified site: Secondary | ICD-10-CM

## 2018-02-15 DIAGNOSIS — E1169 Type 2 diabetes mellitus with other specified complication: Secondary | ICD-10-CM

## 2018-02-15 DIAGNOSIS — G4730 Sleep apnea, unspecified: Secondary | ICD-10-CM | POA: Insufficient documentation

## 2018-02-15 HISTORY — DX: Personal history of diabetic foot ulcer: Z86.31

## 2018-02-15 HISTORY — DX: Presence of dental prosthetic device (complete) (partial): Z97.2

## 2018-02-15 HISTORY — PX: AMPUTATION TOE: SHX6595

## 2018-02-15 LAB — GLUCOSE, CAPILLARY
Glucose-Capillary: 231 mg/dL — ABNORMAL HIGH (ref 70–99)
Glucose-Capillary: 212 mg/dL — ABNORMAL HIGH (ref 70–99)

## 2018-02-15 LAB — PROTIME-INR
INR: 1.16
Prothrombin Time: 14.7 seconds (ref 11.4–15.2)

## 2018-02-15 SURGERY — AMPUTATION, TOE
Anesthesia: Monitor Anesthesia Care | Site: Toe | Laterality: Right

## 2018-02-15 MED ORDER — ONDANSETRON HCL 4 MG/2ML IJ SOLN
INTRAMUSCULAR | Status: AC
  Filled 2018-02-15: qty 2

## 2018-02-15 MED ORDER — OXYCODONE HCL 5 MG/5ML PO SOLN: 5.0000 mg | Freq: Once | ORAL | Status: DC | PRN

## 2018-02-15 MED ORDER — FENTANYL CITRATE (PF) 250 MCG/5ML IJ SOLN
INTRAMUSCULAR | Status: AC
  Filled 2018-02-15: qty 5

## 2018-02-15 MED ORDER — LIDOCAINE HCL (PF) 1 % IJ SOLN
INTRAMUSCULAR | Status: AC
  Filled 2018-02-15: qty 30

## 2018-02-15 MED ORDER — CEFAZOLIN SODIUM-DEXTROSE 2-4 GM/100ML-% IV SOLN
2.0000 g | INTRAVENOUS | Status: AC
  Administered 2018-02-15: 2 g via INTRAVENOUS

## 2018-02-15 MED ORDER — OXYCODONE HCL 5 MG PO TABS: 5.0000 mg | ORAL_TABLET | Freq: Once | ORAL | Status: DC | PRN

## 2018-02-15 MED ORDER — FENTANYL CITRATE (PF) 100 MCG/2ML IJ SOLN: 25.0000 ug | INTRAMUSCULAR | Status: DC | PRN

## 2018-02-15 MED ORDER — LACTATED RINGERS IV SOLN
INTRAVENOUS | Status: DC
  Administered 2018-02-15: 09:00:00 via INTRAVENOUS

## 2018-02-15 MED ORDER — BUPIVACAINE HCL (PF) 0.5 % IJ SOLN
INTRAMUSCULAR | Status: DC | PRN
  Administered 2018-02-15: 10 mL

## 2018-02-15 MED ORDER — CHLORHEXIDINE GLUCONATE 4 % EX LIQD: 60.0000 mL | Freq: Once | CUTANEOUS | Status: DC

## 2018-02-15 MED ORDER — BUPIVACAINE HCL (PF) 0.5 % IJ SOLN
INTRAMUSCULAR | Status: AC
  Filled 2018-02-15: qty 30

## 2018-02-15 MED ORDER — ONDANSETRON HCL 4 MG/2ML IJ SOLN
INTRAMUSCULAR | Status: DC | PRN
  Administered 2018-02-15: 4 mg via INTRAVENOUS

## 2018-02-15 MED ORDER — FENTANYL CITRATE (PF) 100 MCG/2ML IJ SOLN
INTRAMUSCULAR | Status: DC | PRN
  Administered 2018-02-15 (×2): 25 ug via INTRAVENOUS

## 2018-02-15 MED ORDER — BUPIVACAINE HCL (PF) 0.25 % IJ SOLN
INTRAMUSCULAR | Status: AC
  Filled 2018-02-15: qty 30

## 2018-02-15 MED ORDER — 0.9 % SODIUM CHLORIDE (POUR BTL) OPTIME
TOPICAL | Status: DC | PRN
  Administered 2018-02-15: 1000 mL

## 2018-02-15 MED ORDER — ONDANSETRON HCL 4 MG/2ML IJ SOLN: 4.0000 mg | Freq: Once | INTRAMUSCULAR | Status: DC | PRN

## 2018-02-15 SURGICAL SUPPLY — 41 items
BLADE AVERAGE 25X9 (BLADE) ×2 IMPLANT
BLADE LONG MED 31X9 (MISCELLANEOUS) ×2 IMPLANT
BNDG CMPR 9X4 STRL LF SNTH (GAUZE/BANDAGES/DRESSINGS) ×1
BNDG COHESIVE 4X5 TAN STRL (GAUZE/BANDAGES/DRESSINGS) ×2 IMPLANT
BNDG COHESIVE 6X5 TAN STRL LF (GAUZE/BANDAGES/DRESSINGS) ×2 IMPLANT
BNDG ESMARK 4X9 LF (GAUZE/BANDAGES/DRESSINGS) ×2 IMPLANT
CANISTER SUCT 3000ML PPV (MISCELLANEOUS) ×2 IMPLANT
CHLORAPREP W/TINT 26ML (MISCELLANEOUS) ×2 IMPLANT
COVER WAND RF STERILE (DRAPES) ×2 IMPLANT
CUFF TOURNIQUET SINGLE 34IN LL (TOURNIQUET CUFF) IMPLANT
CUFF TOURNIQUET SINGLE 44IN (TOURNIQUET CUFF) IMPLANT
DRAPE U-SHAPE 47X51 STRL (DRAPES) ×4 IMPLANT
DRSG MEPILEX BORDER 4X4 (GAUZE/BANDAGES/DRESSINGS) ×1 IMPLANT
DRSG MEPITEL 3X4 ME34 (GAUZE/BANDAGES/DRESSINGS) ×1 IMPLANT
DRSG MEPITEL 4X7.2 (GAUZE/BANDAGES/DRESSINGS) ×2 IMPLANT
ELECT REM PT RETURN 9FT ADLT (ELECTROSURGICAL) ×2
ELECTRODE REM PT RTRN 9FT ADLT (ELECTROSURGICAL) ×1 IMPLANT
GAUZE SPONGE 4X4 12PLY STRL (GAUZE/BANDAGES/DRESSINGS) ×2 IMPLANT
GLOVE BIO SURGEON STRL SZ8 (GLOVE) ×4 IMPLANT
GLOVE BIOGEL PI IND STRL 8 (GLOVE) ×2 IMPLANT
GLOVE BIOGEL PI INDICATOR 8 (GLOVE) ×2
GLOVE ECLIPSE 8.0 STRL XLNG CF (GLOVE) ×2 IMPLANT
GOWN STRL REUS W/ TWL LRG LVL3 (GOWN DISPOSABLE) ×1 IMPLANT
GOWN STRL REUS W/ TWL XL LVL3 (GOWN DISPOSABLE) ×2 IMPLANT
GOWN STRL REUS W/TWL LRG LVL3 (GOWN DISPOSABLE) ×2
GOWN STRL REUS W/TWL XL LVL3 (GOWN DISPOSABLE) ×4
KIT BASIN OR (CUSTOM PROCEDURE TRAY) ×2 IMPLANT
KIT TURNOVER KIT B (KITS) ×2 IMPLANT
NS IRRIG 1000ML POUR BTL (IV SOLUTION) ×2 IMPLANT
PACK ORTHO EXTREMITY (CUSTOM PROCEDURE TRAY) ×2 IMPLANT
PAD ARMBOARD 7.5X6 YLW CONV (MISCELLANEOUS) ×4 IMPLANT
PAD CAST 4YDX4 CTTN HI CHSV (CAST SUPPLIES) ×1 IMPLANT
PADDING CAST COTTON 4X4 STRL (CAST SUPPLIES) ×2
SPECIMEN JAR SMALL (MISCELLANEOUS) ×2 IMPLANT
SPONGE LAP 18X18 X RAY DECT (DISPOSABLE) IMPLANT
SUCTION FRAZIER HANDLE 10FR (MISCELLANEOUS)
SUCTION TUBE FRAZIER 10FR DISP (MISCELLANEOUS) IMPLANT
SUT ETHILON 2 0 PSLX (SUTURE) ×2 IMPLANT
TOWEL OR 17X26 10 PK STRL BLUE (TOWEL DISPOSABLE) ×2 IMPLANT
TUBE CONNECTING 12X1/4 (SUCTIONS) IMPLANT
UNDERPAD 30X30 (UNDERPADS AND DIAPERS) ×2 IMPLANT

## 2018-02-15 NOTE — Progress Notes (Signed)
Orthopedic Tech Progress Note Patient Details:  Paul Chen 04/15/1940 [id number]  Ortho Devices Type of Ortho Device: Postop shoe/boot Ortho Device/Splint Location: LRE Ortho Device/Splint Interventions: Adjustment, Application, Ordered   Post Interventions Patient Tolerated: Well Instructions Provided: Care of device, Adjustment of device   Donald Pore 02/15/2018, 10:03 AM

## 2018-02-15 NOTE — Anesthesia Procedure Notes (Signed)
Date/Time: 02/15/2018 9:05 AM Performed by: Laruth Bouchard., CRNA Pre-anesthesia Checklist: Patient identified, Emergency Drugs available, Suction available, Patient being monitored and Timeout performed Patient Re-evaluated:Patient Re-evaluated prior to induction Oxygen Delivery Method: Simple face mask Preoxygenation: Pre-oxygenation with 100% oxygen Induction Type: IV induction Placement Confirmation: positive ETCO2

## 2018-02-15 NOTE — Anesthesia Postprocedure Evaluation (Signed)
Anesthesia Post Note  Patient: Paul Chen  Procedure(s) Performed: Right 2nd toe amputation (Right Toe)     Patient location during evaluation: PACU Anesthesia Type: MAC Level of consciousness: awake and alert Pain management: pain level controlled Vital Signs Assessment: post-procedure vital signs reviewed and stable Respiratory status: spontaneous breathing, nonlabored ventilation and respiratory function stable Cardiovascular status: blood pressure returned to baseline and stable Postop Assessment: no apparent nausea or vomiting Anesthetic complications: no    Last Vitals:  Vitals:   02/15/18 1017 02/15/18 1032  BP: (!) 124/47 (!) 113/46  Pulse: 60 61  Resp: 11 13  Temp:    SpO2: 100% 98%    Last Pain:  Vitals:   02/15/18 1015  TempSrc:   PainSc: 0-No pain                 Lidia Collum

## 2018-02-15 NOTE — Anesthesia Preprocedure Evaluation (Signed)
Anesthesia Evaluation  Patient identified by MRN, date of birth, ID band Patient awake    Reviewed: Allergy & Precautions, NPO status , Patient's Chart, lab work & pertinent test results, reviewed documented beta blocker date and time   History of Anesthesia Complications Negative for: history of anesthetic complications  Airway Mallampati: II  TM Distance: >3 FB Neck ROM: Full    Dental no notable dental hx. (+) Missing   Pulmonary sleep apnea , COPD, former smoker, PE Pulmonary HTN   Pulmonary exam normal        Cardiovascular hypertension, Pt. on home beta blockers and Pt. on medications + CAD and + Cardiac Stents  Normal cardiovascular exam     Neuro/Psych negative neurological ROS  negative psych ROS   GI/Hepatic negative GI ROS, Neg liver ROS,   Endo/Other  diabetes, Insulin Dependent  Renal/GU CRFRenal disease  negative genitourinary   Musculoskeletal  (+) Arthritis ,   Abdominal   Peds  Hematology negative hematology ROS (+)   Anesthesia Other Findings Low risk myoview 2018  Reproductive/Obstetrics                          Anesthesia Physical Anesthesia Plan  ASA: III  Anesthesia Plan: MAC   Post-op Pain Management:    Induction:   PONV Risk Score and Plan: 1 and Propofol infusion and Treatment may vary due to age or medical condition  Airway Management Planned: Nasal Cannula and Simple Face Mask  Additional Equipment: None  Intra-op Plan:   Post-operative Plan:   Informed Consent: I have reviewed the patients History and Physical, chart, labs and discussed the procedure including the risks, benefits and alternatives for the proposed anesthesia with the patient or authorized representative who has indicated his/her understanding and acceptance.       Plan Discussed with:   Anesthesia Plan Comments:        Anesthesia Quick Evaluation

## 2018-02-15 NOTE — Op Note (Signed)
02/15/2018  10:01 AM  PATIENT:  Paul Chen  78 y.o. 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  INDICATION FOR PROCEDURE: The patient is a 78 year old male with uncontrolled diabetes.  He has a history of a right hallux ulcer requiring amputation 2 months ago.  He has now developed a second toe ulcer.  Bone is exposed at the dorsum of the DIP and PIP joints.  He presents for amputation of the second toe now.  This is a separate diagnosis in the postoperative period for his right hallux amputation.  The risks and benefits of the alternative treatment options have been discussed in detail.  The patient wishes to proceed with surgery and specifically understands risks of bleeding, infection, nerve damage, blood clots, need for additional surgery, amputation and death.  PROCEDURE IN DETAIL:  After pre operative consent was obtained, and the correct operative site was identified, the patient was brought to the operating room and placed supine on the OR table.  Anesthesia was administered.  Pre-operative antibiotics were administered.  A surgical timeout was taken.  The right lower extremity was prepped and draped in standard sterile fashion.  The foot was exsanguinated and an Esmarch tourniquet wrapped around the ankle.  A fishmouth incision was then made around the second toe proximal phalanx.  Dissection was carried down through the subcutaneous tissues to the level of the bone.  The bone was cut in the distal portion of the toe removed.  The wound was irrigated.  The neurovascular bundles were cauterized.  The incision was closed with nylon.   Sterile dressings were applied followed by a compression wrap.  The tourniquet was released after application of the dressings.  The patient was awakened from anesthesia and transported to the recovery room in stable condition.  FOLLOW UP PLAN: Weightbearing as tolerated in a flat postop shoe.  Follow-up in 2 weeks for suture removal.  Resume blood thinners postop day 1.   Alfredo Martinez PA-C was present and scrubbed for the duration of the operative case. His assistance was essential in positioning the patient, prepping and draping, gaining and maintaining exposure, performing the operation, closing and dressing the wounds and applying the splint.

## 2018-02-15 NOTE — Discharge Instructions (Addendum)
Toni Arthurs, MD [facility]  Please read the following information regarding your care after surgery.  Medications  You only need a prescription for the narcotic pain medicine (ex. oxycodone, Percocet, Norco).  All of the other medicines listed below are available over the counter. X Aleve 2 pills twice a day for the first 3 days after surgery. X acetominophen (Tylenol) 650 mg every 4-6 hours as you need for minor to moderate pain X resume oxycodone as prescribed by pain management provider  Narcotic pain medicine (ex. oxycodone, Percocet, Vicodin) will cause constipation.  To prevent this problem, take the following medicines while you are taking any pain medicine. X docusate sodium (Colace) 100 mg twice a day X senna (Senokot) 2 tablets twice a day  X To help prevent blood clots, resume plavix  Weight Bearing X Bear weight only on your operated foot in the post-op shoe. X NO DRIVING!   Cast / Splint / Dressing X Keep your splint, cast or dressing clean and dry.  Dont put anything (coat hanger, pencil, etc) down inside of it.  If it gets damp, use a hair dryer on the cool setting to dry it.  If it gets soaked, call the office to schedule an appointment for a cast change.   After your dressing, cast or splint is removed; you may shower, but do not soak or scrub the wound.  Allow the water to run over it, and then gently pat it dry.  Swelling It is normal for you to have swelling where you had surgery.  To reduce swelling and pain, keep your toes above your nose for at least 3 days after surgery.  It may be necessary to keep your foot or leg elevated for several weeks.  If it hurts, it should be elevated.  Follow Up Call my office at [phone] when you are discharged from the hospital or surgery center to schedule an appointment to be seen two weeks after surgery.  Call my office at [phone] if you develop a fever >101.5 F, nausea, vomiting, bleeding from the  surgical site or severe pain.

## 2018-02-15 NOTE — Transfer of Care (Signed)
Immediate Anesthesia Transfer of Care Note  Patient: Paul Chen  Procedure(s) Performed: Right 2nd toe amputation (Right Toe)  Patient Location: PACU  Anesthesia Type:MAC  Level of Consciousness: awake, alert  and oriented  Airway & Oxygen Therapy: Patient Spontanous Breathing and Patient connected to face mask oxygen  Post-op Assessment: Report given to RN and Post -op Vital signs reviewed and stable  Post vital signs: Reviewed and stable  Last Vitals:  Vitals Value Taken Time  BP 142/59 02/15/2018  9:47 AM  Temp    Pulse 69 02/15/2018  9:49 AM  Resp 6 02/15/2018  9:49 AM  SpO2 100 % 02/15/2018  9:49 AM  Vitals shown include unvalidated device data.  Last Pain:  Vitals:   02/15/18 0948  TempSrc:   PainSc: (P) 0-No pain         Complications: No apparent anesthesia complications

## 2018-02-15 NOTE — H&P (Signed)
Paul Chen is an 78 y.o. male.   Chief Complaint: Right second toe ulcer HPI: The patient is a 78 year old male with past medical history significant for poorly controlled diabetes and peripheral vascular disease.  He is now 2 months status post right hallux amputation for a chronic nonhealing diabetic ulcer.  This wound has healed but the second toe has now developed a chronic nonhealing ulcer with exposed bone.  He presents now for second toe amputation.  Past Medical History:  Diagnosis Date  . Arthritis   . Chronic renal insufficiency   . CKD (chronic kidney disease), stage III (HCC)   . COPD (chronic obstructive pulmonary disease) (HCC)    with restriction  . Coronary disease October 2010   Status post stenting of the proximal to mid right coronary with DES.  Status post extensive stenting of the proximal right coronary to the crux.  He has moderate disease in  the left coronary system  . Depression   . Diabetes mellitus    Insulin Dependent  . Diastolic dysfunction    Congestive heart failure with  . Dyspnea    multifactorial. chronic, 11/18/9- not an issue at this time  . Gout   . History of blood transfusion   . History of diabetic ulcer of foot    right  . Hypertension   . Hypoventilation    syndrome  . Obesity   . Pneumonia   . Pulmonary embolism [facility]) March 2010   hx of and DVT in March of 2010 on chronic Coumadin therapy  . Pulmonary hypertension (HCC)    Moderate to severe  . Retroperitoneal bleed    Hx of right pelvic  . Sleep apnea    Obstructive , no CPAP  . Wears dentures     Past Surgical History:  Procedure Laterality Date  . ABDOMINAL AORTOGRAM W/LOWER EXTREMITY Right 01/15/2018   Procedure: ABDOMINAL AORTOGRAM W/LOWER EXTREMITY;  Surgeon: Maeola Harman, MD;  Location: [facility] INVASIVE CV LAB;  Service: Cardiovascular;  Laterality: Right;  . AMPUTATION TOE Left 05/04/2017   Procedure: Left hallux amputation;  Surgeon: Toni Arthurs, MD;   Location: [facility];  Service: Orthopedics;  Laterality: Left;  . AMPUTATION TOE Right 12/21/2017   Procedure: Right hallux amputation;  Surgeon: Toni Arthurs, MD;  Location: [facility];  Service: Orthopedics;  Laterality: Right;   . BACK SURGERY  2000   post  . CARDIOVASCULAR STRESS TEST  11-14-2008   EF 44%  . EXPLORATORY LAPAROTOMY  1972   POST  . LOWER EXTREMITY ANGIOGRAPHY N/A 05/01/2017   Procedure: LOWER EXTREMITY ANGIOGRAPHY;  Surgeon: Maeola Harman, MD;  Location: [facility] INVASIVE CV LAB;  Service: Cardiovascular;  Laterality: N/A;  . LOWER EXTREMITY ANGIOGRAPHY N/A 05/15/2017   Procedure: LOWER EXTREMITY ANGIOGRAPHY;  Surgeon: Maeola Harman, MD;  Location: [facility] INVASIVE CV LAB;  Service: Cardiovascular;  Laterality: N/A;  . MULTIPLE TOOTH EXTRACTIONS    . PERIPHERAL VASCULAR ATHERECTOMY  05/01/2017   Procedure: PERIPHERAL VASCULAR ATHERECTOMY;  Surgeon: Maeola Harman, MD;  Location: [facility] INVASIVE CV LAB;  Service: Cardiovascular;;  Left SFA  . PERIPHERAL VASCULAR BALLOON ANGIOPLASTY  05/01/2017   Procedure: PERIPHERAL VASCULAR BALLOON ANGIOPLASTY;  Surgeon: Maeola Harman, MD;  Location: [facility] INVASIVE CV LAB;  Service: Cardiovascular;;  Left Anterial Tibial Artery  . PERIPHERAL VASCULAR BALLOON ANGIOPLASTY Right 01/15/2018   Procedure: PERIPHERAL VASCULAR BALLOON ANGIOPLASTY;  Surgeon: Maeola Harman, MD;  Location: [facility] INVASIVE CV LAB;  Service: Cardiovascular;  Laterality: Right;  SFA atherectomy  . PERIPHERAL VASCULAR INTERVENTION Right 05/15/2017   Procedure: PERIPHERAL VASCULAR INTERVENTION;  Surgeon: Maeola Harmanain, Brandon Christopher, MD;  [facility];  Service: Cardiovascular;  Laterality: Right;  SFA Stent  . US ECHOCARDIOGRAPHY  11-25-2008   EF 55-60%    Family History  Problem Relation Age of Onset  . Heart attack Mother   . Coronary artery disease Mother   . Hypertension Mother   .  Coronary artery disease Father   . Heart attack Father    Social History:  reports that he quit smoking about 38 years ago. He quit after 2.00 years of use. He has never used smokeless tobacco. He reports previous alcohol use. He reports that he does not use drugs.  Allergies: No Known Allergies  Medications Prior to Admission  Medication Sig Dispense Refill  . albuterol (PROVENTIL) (2.5 MG/3ML) 0.083% nebulizer solution Take 2.5 mg by nebulization every 4 (four) hours as needed for wheezing or shortness of breath.     . allopurinol (ZYLOPRIM) 300 MG tablet Take 300 mg by mouth daily.   11  . amLODipine (NORVASC) 5 MG tablet Take 1 tablet (5 mg total) by mouth daily. 90 tablet 3  . atorvastatin (LIPITOR) 40 MG tablet Take 1 tablet (40 mg total) by mouth daily. 90 tablet 3  . busPIRone (BUSPAR) 15 MG tablet Take 15 mg by mouth 2 (two) times daily.     . cephALEXin (KEFLEX) 500 MG capsule Take 500 mg by mouth 4 (four) times daily.    . clopidogrel (PLAVIX) 75 MG tablet TAKE ONE TABLET BY MOUTH EVERY MORNING 30 tablet 11  . doxazosin (CARDURA) 4 MG tablet Take 4 mg by mouth every evening.     . DULoxetine (CYMBALTA) 60 MG capsule Take 60 mg by mouth daily.      Marland Kitchen. enoxaparin (LOVENOX) 120 MG/0.8ML injection Inject 0.8 mLs (120 mg total) into the skin every 12 (twelve) hours for 3 days. (Patient taking differently: Inject 120 mg into the skin every 12 (twelve) hours. ) 4.8 mL 0  . FLOVENT HFA 44 MCG/ACT inhaler Inhale 3 puffs into the lungs 2 (two) times daily.     . furosemide (LASIX) 20 MG tablet Take 40 mg by mouth daily.   11  . gabapentin (NEURONTIN) 300 MG capsule Take 600 mg by mouth 2 (two) times daily.    Marland Kitchen. glucagon 1 MG injection Inject 1 mg into the vein once as needed (for low blood sugars.).     . Insulin Glargine (BASAGLAR KWIKPEN) 100 UNIT/ML SOPN Inject 40 Units into the skin daily.    Marland Kitchen. ipratropium (ATROVENT) 0.02 % nebulizer solution Take 0.5 mg by nebulization every 4 (four)  hours as needed for wheezing or shortness of breath.     . lisinopril (PRINIVIL,ZESTRIL) 10 MG tablet Take 10 mg by mouth daily.  3  . Magnesium Oxide 400 (240 Mg) MG TABS Take 400 mg by mouth 2 (two) times daily.  5  . metoprolol succinate (TOPROL-XL) 100 MG 24 hr tablet Take 150 mg by mouth daily.     . Multiple Vitamins-Minerals (CENTRUM SILVER ULTRA MENS PO) Take 1 tablet by mouth daily.  99  . naloxone (NARCAN) nasal spray 4 mg/0.1 mL Place 1 spray into the nose as needed (opioid overdose).     . nitroGLYCERIN (NITROSTAT) 0.4 MG SL tablet Place 0.4 mg under the tongue every 5 (five) minutes as needed for chest pain.     .Marland Kitchen  NOVOLOG FLEXPEN 100 UNIT/ML FlexPen Inject 8-12 Units into the skin 2 (two) times daily with a meal.   11  . Omega-3 Fatty Acids (FISH OIL) 1000 MG CAPS Take 1,000 mg by mouth at bedtime.    Marland Kitchen oxyCODONE (ROXICODONE) 15 MG immediate release tablet Take 15 mg by mouth 4 (four) times daily as needed for pain.     Marland Kitchen oxyCODONE ER (XTAMPZA ER) 36 MG C12A Take 36 mg by mouth every 12 (twelve) hours.    . silver sulfADIAZINE (SILVADENE) 1 % cream Apply 1 application topically daily as needed (wound care). apply to toes    . ciclopirox (PENLAC) 8 % solution Apply 1 application topically at bedtime. Apply over nail and surrounding skin. Apply daily over previous coat. After seven (7) days, may remove with alcohol and continue cycle.    . warfarin (COUMADIN) 2.5 MG tablet Take 2.5 mg by mouth every evening.   5    No results found for this or any previous visit (from the past 48 hour(s)). No results found.  ROS no recent fever, chills, nausea, vomiting or changes in his appetite  Blood pressure (!) 185/46, pulse 70, temperature 98.1 F (36.7 C), temperature source Oral, resp. rate 18, height 5\' 10"  (1.778 m), weight 113.4 kg, SpO2 90 %. Physical Exam  Well-nourished well-developed elderly man in no apparent distress.  Alert and oriented x4.  Moderately hard of hearing.   Extraocular motions are intact.  Respirations are unlabored.  Gait is antalgic bilaterally, wide-based and shuffling.  The right second toe has a dorsal ulcer from the tip back to the level of the PIP joint.  Bone is exposed at the PIP and DIP joints.  Brisk capillary refill at the toe.  No lymphadenopathy.  Chronic lymphedema of both lower extremities.  5 out of 5 strength in plantar flexion and dorsiflexion of the ankles.  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  This is a separate diagnosis in the postoperative period for his previous hallux amputation.  The risks and benefits of the alternative treatment options have been discussed in detail.  The patient wishes to proceed with surgery and specifically understands risks of bleeding, infection, nerve damage, blood clots, need for additional surgery, amputation and death.   Toni Arthurs, MD 03-15-18, 7:31 AM

## 2018-02-16 ENCOUNTER — Encounter (HOSPITAL_COMMUNITY): Payer: Self-pay | Admitting: Orthopedic Surgery

## 2018-02-17 ENCOUNTER — Inpatient Hospital Stay (HOSPITAL_COMMUNITY)
Admission: AD | Admit: 2018-02-17 | Discharge: 2018-02-27 | DRG: 981 | Disposition: A | Discharge status: Skilled Nursing Facility | Payer: Medicare Other | Source: Other Acute Inpatient Hospital | Attending: Internal Medicine | Admitting: Internal Medicine

## 2018-02-17 ENCOUNTER — Inpatient Hospital Stay (HOSPITAL_COMMUNITY): Payer: Medicare Other

## 2018-02-17 ENCOUNTER — Inpatient Hospital Stay: Admission: AD | Admit: 2018-02-17 | Payer: Self-pay | Admitting: Pulmonary Disease

## 2018-02-17 ENCOUNTER — Other Ambulatory Visit: Payer: Self-pay

## 2018-02-17 DIAGNOSIS — I214 Non-ST elevation (NSTEMI) myocardial infarction: Secondary | ICD-10-CM | POA: Diagnosis not present

## 2018-02-17 DIAGNOSIS — F329 Major depressive disorder, single episode, unspecified: Secondary | ICD-10-CM | POA: Diagnosis present

## 2018-02-17 DIAGNOSIS — I10 Essential (primary) hypertension: Secondary | ICD-10-CM | POA: Diagnosis not present

## 2018-02-17 DIAGNOSIS — I480 Paroxysmal atrial fibrillation: Secondary | ICD-10-CM | POA: Diagnosis present

## 2018-02-17 DIAGNOSIS — Z87891 Personal history of nicotine dependence: Secondary | ICD-10-CM

## 2018-02-17 DIAGNOSIS — I2510 Atherosclerotic heart disease of native coronary artery without angina pectoris: Secondary | ICD-10-CM | POA: Diagnosis present

## 2018-02-17 DIAGNOSIS — Z79891 Long term (current) use of opiate analgesic: Secondary | ICD-10-CM

## 2018-02-17 DIAGNOSIS — J9690 Respiratory failure, unspecified, unspecified whether with hypoxia or hypercapnia: Secondary | ICD-10-CM | POA: Diagnosis present

## 2018-02-17 DIAGNOSIS — L97519 Non-pressure chronic ulcer of other part of right foot with unspecified severity: Secondary | ICD-10-CM | POA: Diagnosis present

## 2018-02-17 DIAGNOSIS — G8929 Other chronic pain: Secondary | ICD-10-CM | POA: Diagnosis present

## 2018-02-17 DIAGNOSIS — R627 Adult failure to thrive: Secondary | ICD-10-CM | POA: Diagnosis present

## 2018-02-17 DIAGNOSIS — E876 Hypokalemia: Secondary | ICD-10-CM | POA: Diagnosis not present

## 2018-02-17 DIAGNOSIS — J9621 Acute and chronic respiratory failure with hypoxia: Secondary | ICD-10-CM | POA: Diagnosis present

## 2018-02-17 DIAGNOSIS — I5032 Chronic diastolic (congestive) heart failure: Secondary | ICD-10-CM | POA: Diagnosis present

## 2018-02-17 DIAGNOSIS — Z9119 Patient's noncompliance with other medical treatment and regimen: Secondary | ICD-10-CM

## 2018-02-17 DIAGNOSIS — Z9989 Dependence on other enabling machines and devices: Secondary | ICD-10-CM

## 2018-02-17 DIAGNOSIS — R0602 Shortness of breath: Secondary | ICD-10-CM | POA: Diagnosis not present

## 2018-02-17 DIAGNOSIS — M25552 Pain in left hip: Secondary | ICD-10-CM | POA: Diagnosis present

## 2018-02-17 DIAGNOSIS — IMO0001 Reserved for inherently not codable concepts without codable children: Secondary | ICD-10-CM

## 2018-02-17 DIAGNOSIS — G4733 Obstructive sleep apnea (adult) (pediatric): Secondary | ICD-10-CM | POA: Diagnosis not present

## 2018-02-17 DIAGNOSIS — E11621 Type 2 diabetes mellitus with foot ulcer: Secondary | ICD-10-CM | POA: Diagnosis present

## 2018-02-17 DIAGNOSIS — I130 Hypertensive heart and chronic kidney disease with heart failure and stage 1 through stage 4 chronic kidney disease, or unspecified chronic kidney disease: Secondary | ICD-10-CM | POA: Diagnosis present

## 2018-02-17 DIAGNOSIS — R0902 Hypoxemia: Secondary | ICD-10-CM | POA: Diagnosis not present

## 2018-02-17 DIAGNOSIS — Z89412 Acquired absence of left great toe: Secondary | ICD-10-CM

## 2018-02-17 DIAGNOSIS — T40605A Adverse effect of unspecified narcotics, initial encounter: Secondary | ICD-10-CM | POA: Diagnosis not present

## 2018-02-17 DIAGNOSIS — Z794 Long term (current) use of insulin: Secondary | ICD-10-CM

## 2018-02-17 DIAGNOSIS — R339 Retention of urine, unspecified: Secondary | ICD-10-CM | POA: Diagnosis not present

## 2018-02-17 DIAGNOSIS — J9622 Acute and chronic respiratory failure with hypercapnia: Secondary | ICD-10-CM | POA: Diagnosis present

## 2018-02-17 DIAGNOSIS — S301XXA Contusion of abdominal wall, initial encounter: Secondary | ICD-10-CM | POA: Diagnosis not present

## 2018-02-17 DIAGNOSIS — Z6835 Body mass index (BMI) 35.0-35.9, adult: Secondary | ICD-10-CM | POA: Diagnosis not present

## 2018-02-17 DIAGNOSIS — Z79899 Other long term (current) drug therapy: Secondary | ICD-10-CM

## 2018-02-17 DIAGNOSIS — Z978 Presence of other specified devices: Secondary | ICD-10-CM

## 2018-02-17 DIAGNOSIS — Z7902 Long term (current) use of antithrombotics/antiplatelets: Secondary | ICD-10-CM

## 2018-02-17 DIAGNOSIS — I4892 Unspecified atrial flutter: Secondary | ICD-10-CM | POA: Diagnosis not present

## 2018-02-17 DIAGNOSIS — J9811 Atelectasis: Secondary | ICD-10-CM | POA: Diagnosis present

## 2018-02-17 DIAGNOSIS — Z86718 Personal history of other venous thrombosis and embolism: Secondary | ICD-10-CM

## 2018-02-17 DIAGNOSIS — E10649 Type 1 diabetes mellitus with hypoglycemia without coma: Secondary | ICD-10-CM | POA: Diagnosis not present

## 2018-02-17 DIAGNOSIS — Z86711 Personal history of pulmonary embolism: Secondary | ICD-10-CM

## 2018-02-17 DIAGNOSIS — R945 Abnormal results of liver function studies: Secondary | ICD-10-CM | POA: Diagnosis not present

## 2018-02-17 DIAGNOSIS — E1152 Type 2 diabetes mellitus with diabetic peripheral angiopathy with gangrene: Secondary | ICD-10-CM | POA: Diagnosis present

## 2018-02-17 DIAGNOSIS — J9601 Acute respiratory failure with hypoxia: Secondary | ICD-10-CM | POA: Diagnosis not present

## 2018-02-17 DIAGNOSIS — D631 Anemia in chronic kidney disease: Secondary | ICD-10-CM | POA: Diagnosis present

## 2018-02-17 DIAGNOSIS — N183 Chronic kidney disease, stage 3 (moderate): Secondary | ICD-10-CM | POA: Diagnosis present

## 2018-02-17 DIAGNOSIS — E119 Type 2 diabetes mellitus without complications: Secondary | ICD-10-CM | POA: Diagnosis not present

## 2018-02-17 DIAGNOSIS — N179 Acute kidney failure, unspecified: Secondary | ICD-10-CM | POA: Diagnosis present

## 2018-02-17 DIAGNOSIS — Z89422 Acquired absence of other left toe(s): Secondary | ICD-10-CM

## 2018-02-17 DIAGNOSIS — Z9181 History of falling: Secondary | ICD-10-CM

## 2018-02-17 DIAGNOSIS — Z955 Presence of coronary angioplasty implant and graft: Secondary | ICD-10-CM

## 2018-02-17 DIAGNOSIS — E1022 Type 1 diabetes mellitus with diabetic chronic kidney disease: Secondary | ICD-10-CM | POA: Diagnosis present

## 2018-02-17 DIAGNOSIS — I959 Hypotension, unspecified: Secondary | ICD-10-CM | POA: Diagnosis not present

## 2018-02-17 DIAGNOSIS — Z8249 Family history of ischemic heart disease and other diseases of the circulatory system: Secondary | ICD-10-CM

## 2018-02-17 DIAGNOSIS — Z89421 Acquired absence of other right toe(s): Secondary | ICD-10-CM

## 2018-02-17 DIAGNOSIS — I248 Other forms of acute ischemic heart disease: Secondary | ICD-10-CM | POA: Diagnosis present

## 2018-02-17 DIAGNOSIS — K5903 Drug induced constipation: Secondary | ICD-10-CM | POA: Diagnosis not present

## 2018-02-17 DIAGNOSIS — G92 Toxic encephalopathy: Secondary | ICD-10-CM | POA: Diagnosis not present

## 2018-02-17 DIAGNOSIS — J9602 Acute respiratory failure with hypercapnia: Secondary | ICD-10-CM | POA: Diagnosis not present

## 2018-02-17 DIAGNOSIS — I483 Typical atrial flutter: Secondary | ICD-10-CM | POA: Diagnosis not present

## 2018-02-17 DIAGNOSIS — M869 Osteomyelitis, unspecified: Secondary | ICD-10-CM | POA: Diagnosis present

## 2018-02-17 DIAGNOSIS — S301XXD Contusion of abdominal wall, subsequent encounter: Secondary | ICD-10-CM | POA: Diagnosis not present

## 2018-02-17 DIAGNOSIS — J449 Chronic obstructive pulmonary disease, unspecified: Secondary | ICD-10-CM | POA: Diagnosis present

## 2018-02-17 DIAGNOSIS — R7989 Other specified abnormal findings of blood chemistry: Secondary | ICD-10-CM | POA: Diagnosis not present

## 2018-02-17 DIAGNOSIS — R338 Other retention of urine: Secondary | ICD-10-CM | POA: Diagnosis not present

## 2018-02-17 DIAGNOSIS — E662 Morbid (severe) obesity with alveolar hypoventilation: Secondary | ICD-10-CM | POA: Diagnosis present

## 2018-02-17 DIAGNOSIS — Z7901 Long term (current) use of anticoagulants: Secondary | ICD-10-CM

## 2018-02-17 LAB — POCT I-STAT 3, ART BLOOD GAS (G3+)
Acid-Base Excess: 2 mmol/L (ref 0.0–2.0)
Acid-Base Excess: 1 mmol/L (ref 0.0–2.0)
Acid-Base Excess: 3 mmol/L — ABNORMAL HIGH (ref 0.0–2.0)
Bicarbonate: 31.7 mmol/L — ABNORMAL HIGH (ref 20.0–28.0)
Bicarbonate: 30.2 mmol/L — ABNORMAL HIGH (ref 20.0–28.0)
Bicarbonate: 27.4 mmol/L (ref 20.0–28.0)
O2 Saturation: 98 %
O2 Saturation: 97 %
O2 Saturation: 100 %
Patient temperature: 97.4
Patient temperature: 97.4
Patient temperature: 97.8
TCO2: 34 mmol/L — ABNORMAL HIGH (ref 22–32)
TCO2: 32 mmol/L (ref 22–32)
TCO2: 29 mmol/L (ref 22–32)
pCO2 arterial: 76.2 mmHg (ref 32.0–48.0)
pCO2 arterial: 71.4 mmHg (ref 32.0–48.0)
pCO2 arterial: 38 mmHg (ref 32.0–48.0)
pH, Arterial: 7.223 — ABNORMAL LOW (ref 7.350–7.450)
pH, Arterial: 7.23 — ABNORMAL LOW (ref 7.350–7.450)
pH, Arterial: 7.465 — ABNORMAL HIGH (ref 7.350–7.450)
pO2, Arterial: 135 mmHg — ABNORMAL HIGH (ref 83.0–108.0)
pO2, Arterial: 103 mmHg (ref 83.0–108.0)
pO2, Arterial: 400 mmHg — ABNORMAL HIGH (ref 83.0–108.0)

## 2018-02-17 LAB — COMPREHENSIVE METABOLIC PANEL
ALT: 104 U/L — ABNORMAL HIGH (ref 0–44)
AST: 148 U/L — ABNORMAL HIGH (ref 15–41)
Albumin: 3 g/dL — ABNORMAL LOW (ref 3.5–5.0)
Alkaline Phosphatase: 56 U/L (ref 38–126)
Anion gap: 10 (ref 5–15)
BUN: 46 mg/dL — ABNORMAL HIGH (ref 8–23)
CO2: 27 mmol/L (ref 22–32)
Calcium: 7.5 mg/dL — ABNORMAL LOW (ref 8.9–10.3)
Chloride: 105 mmol/L (ref 98–111)
Creatinine, Ser: 3.48 mg/dL — ABNORMAL HIGH (ref 0.61–1.24)
GFR calc Af Amer: 19 mL/min — ABNORMAL LOW (ref >60)
GFR calc non Af Amer: 16 mL/min — ABNORMAL LOW (ref >60)
Glucose, Bld: 215 mg/dL — ABNORMAL HIGH (ref 70–99)
Potassium: 4 mmol/L (ref 3.5–5.1)
Sodium: 142 mmol/L (ref 135–145)
Total Bilirubin: 1 mg/dL (ref 0.3–1.2)
Total Protein: 6 g/dL — ABNORMAL LOW (ref 6.5–8.1)

## 2018-02-17 LAB — CBC
HCT: 35.7 % — ABNORMAL LOW (ref 39.0–52.0)
Hemoglobin: 10.6 g/dL — ABNORMAL LOW (ref 13.0–17.0)
MCH: 29 pg (ref 26.0–34.0)
MCHC: 29.7 g/dL — ABNORMAL LOW (ref 30.0–36.0)
MCV: 97.5 fL (ref 80.0–100.0)
Platelets: 163 10*3/uL (ref 150–400)
RBC: 3.66 MIL/uL — ABNORMAL LOW (ref 4.22–5.81)
RDW: 15.2 % (ref 11.5–15.5)
WBC: 10.4 10*3/uL (ref 4.0–10.5)
nRBC: 0 % (ref 0.0–0.2)

## 2018-02-17 LAB — AMYLASE: Amylase: 13 U/L — ABNORMAL LOW (ref 28–100)

## 2018-02-17 LAB — PROTIME-INR
INR: 1.28
Prothrombin Time: 15.8 seconds — ABNORMAL HIGH (ref 11.4–15.2)

## 2018-02-17 LAB — STREP PNEUMONIAE URINARY ANTIGEN: Strep Pneumo Urinary Antigen: NEGATIVE

## 2018-02-17 LAB — CORTISOL: Cortisol, Plasma: 13.6 ug/dL

## 2018-02-17 LAB — PHOSPHORUS: Phosphorus: 5 mg/dL — ABNORMAL HIGH (ref 2.5–4.6)

## 2018-02-17 LAB — GLUCOSE, CAPILLARY
Glucose-Capillary: 165 mg/dL — ABNORMAL HIGH (ref 70–99)
Glucose-Capillary: 187 mg/dL — ABNORMAL HIGH (ref 70–99)
Glucose-Capillary: 196 mg/dL — ABNORMAL HIGH (ref 70–99)
Glucose-Capillary: 197 mg/dL — ABNORMAL HIGH (ref 70–99)
Glucose-Capillary: 202 mg/dL — ABNORMAL HIGH (ref 70–99)

## 2018-02-17 LAB — HEPARIN LEVEL (UNFRACTIONATED): Heparin Unfractionated: 0.95 IU/mL — ABNORMAL HIGH (ref 0.30–0.70)

## 2018-02-17 LAB — LACTIC ACID, PLASMA
Lactic Acid, Venous: 0.9 mmol/L (ref 0.5–1.9)
Lactic Acid, Venous: 1.4 mmol/L (ref 0.5–1.9)

## 2018-02-17 LAB — PROCALCITONIN: Procalcitonin: 0.21 ng/mL

## 2018-02-17 LAB — LIPASE, BLOOD: Lipase: 20 U/L (ref 11–51)

## 2018-02-17 LAB — MAGNESIUM: Magnesium: 2.5 mg/dL — ABNORMAL HIGH (ref 1.7–2.4)

## 2018-02-17 LAB — MRSA PCR SCREENING: MRSA by PCR: NEGATIVE

## 2018-02-17 MED ORDER — HEPARIN (PORCINE) 25000 UT/250ML-% IV SOLN
1600.0000 [IU]/h | INTRAVENOUS | Status: DC
  Administered 2018-02-17: 1900 [IU]/h via INTRAVENOUS
  Administered 2018-02-18 – 2018-02-19 (×2): 1600 [IU]/h via INTRAVENOUS
  Filled 2018-02-17 (×2): qty 250

## 2018-02-17 MED ORDER — SODIUM CHLORIDE 0.9 % IV SOLN
350.00 | INTRAVENOUS | Status: DC
Start: ? — End: 2018-02-17

## 2018-02-17 MED ORDER — FENTANYL 2500MCG IN NS 250ML (10MCG/ML) PREMIX INFUSION
25.0000 ug/h | INTRAVENOUS | Status: DC
  Administered 2018-02-17: 100 ug/h via INTRAVENOUS
  Administered 2018-02-18: 300 ug/h via INTRAVENOUS
  Administered 2018-02-18: 25 ug/h via INTRAVENOUS
  Filled 2018-02-17 (×2): qty 250

## 2018-02-17 MED ORDER — PHENYLEPHRINE HCL-NACL 10-0.9 MG/250ML-% IV SOLN
25.0000 ug/min | INTRAVENOUS | Status: DC
  Filled 2018-02-17: qty 250

## 2018-02-17 MED ORDER — PIPERACILLIN-TAZOBACTAM 3.375 G IVPB
3.3750 g | Freq: Three times a day (TID) | INTRAVENOUS | Status: DC
  Administered 2018-02-17 – 2018-02-18 (×2): 3.375 g via INTRAVENOUS
  Filled 2018-02-17 (×4): qty 50

## 2018-02-17 MED ORDER — MIDAZOLAM HCL 2 MG/2ML IJ SOLN
2.0000 mg | Freq: Once | INTRAMUSCULAR | Status: AC
  Administered 2018-02-17: 2 mg via INTRAVENOUS

## 2018-02-17 MED ORDER — HEPARIN (PORCINE) IN NACL 25000-0.45 UT/250ML-% IV SOLN
18.00 | INTRAVENOUS | Status: DC
Start: ? — End: 2018-02-17

## 2018-02-17 MED ORDER — FENTANYL CITRATE (PF) 100 MCG/2ML IJ SOLN
50.0000 ug | Freq: Once | INTRAMUSCULAR | Status: AC
  Administered 2018-02-17: 50 ug via INTRAVENOUS
  Filled 2018-02-17: qty 2

## 2018-02-17 MED ORDER — FENTANYL BOLUS VIA INFUSION
25.0000 ug | INTRAVENOUS | Status: DC | PRN
  Administered 2018-02-18: 25 ug via INTRAVENOUS
  Filled 2018-02-17: qty 25

## 2018-02-17 MED ORDER — MIDAZOLAM HCL 2 MG/2ML IJ SOLN
INTRAMUSCULAR | Status: AC
  Filled 2018-02-17: qty 2

## 2018-02-17 MED ORDER — ORAL CARE MOUTH RINSE: 15.0000 mL | Freq: Two times a day (BID) | OROMUCOSAL | Status: DC

## 2018-02-17 MED ORDER — FENTANYL CITRATE (PF) 100 MCG/2ML IJ SOLN
50.0000 ug | Freq: Once | INTRAMUSCULAR | Status: AC
  Administered 2018-02-17: 100 ug via INTRAVENOUS

## 2018-02-17 MED ORDER — PANTOPRAZOLE SODIUM 40 MG IV SOLR
40.0000 mg | Freq: Every day | INTRAVENOUS | Status: DC
  Administered 2018-02-17 – 2018-02-19 (×3): 40 mg via INTRAVENOUS
  Filled 2018-02-17 (×3): qty 40

## 2018-02-17 MED ORDER — CHLORHEXIDINE GLUCONATE 0.12% ORAL RINSE (MEDLINE KIT)
15.0000 mL | Freq: Two times a day (BID) | OROMUCOSAL | Status: DC
  Administered 2018-02-17 – 2018-02-18 (×2): 15 mL via OROMUCOSAL

## 2018-02-17 MED ORDER — SODIUM CHLORIDE 0.9 % IV SOLN
250.0000 mL | INTRAVENOUS | Status: DC
  Administered 2018-02-17: 250 mL via INTRAVENOUS

## 2018-02-17 MED ORDER — CHLORHEXIDINE GLUCONATE 0.12 % MT SOLN: 15.0000 mL | Freq: Two times a day (BID) | OROMUCOSAL | Status: DC

## 2018-02-17 MED ORDER — FENTANYL CITRATE (PF) 100 MCG/2ML IJ SOLN
INTRAMUSCULAR | Status: AC
  Filled 2018-02-17: qty 2

## 2018-02-17 MED ORDER — ETOMIDATE 2 MG/ML IV SOLN
10.0000 mg | Freq: Once | INTRAVENOUS | Status: AC
  Administered 2018-02-17: 20 mg via INTRAVENOUS

## 2018-02-17 MED ORDER — HEPARIN (PORCINE) 25000 UT/250ML-% IV SOLN
1400.0000 [IU]/h | INTRAVENOUS | Status: DC
  Filled 2018-02-17: qty 250

## 2018-02-17 MED ORDER — PIPERACILLIN-TAZOBACTAM 3.375 G IVPB
3.3750 g | Freq: Once | INTRAVENOUS | Status: AC
  Administered 2018-02-17: 3.375 g via INTRAVENOUS
  Filled 2018-02-17: qty 50

## 2018-02-17 MED ORDER — HEPARIN BOLUS VIA INFUSION
5000.0000 [IU] | Freq: Once | INTRAVENOUS | Status: DC
  Filled 2018-02-17: qty 5000

## 2018-02-17 MED ORDER — HEPARIN SODIUM (PORCINE) 1000 UNIT/ML IJ SOLN
5000.00 | INTRAMUSCULAR | Status: DC
Start: ? — End: 2018-02-17

## 2018-02-17 MED ORDER — DEXMEDETOMIDINE HCL IN NACL 400 MCG/100ML IV SOLN
0.4000 ug/kg/h | INTRAVENOUS | Status: DC
  Administered 2018-02-17: 0.8 ug/kg/h via INTRAVENOUS
  Administered 2018-02-17 – 2018-02-18 (×3): 1.2 ug/kg/h via INTRAVENOUS
  Administered 2018-02-18: 1 ug/kg/h via INTRAVENOUS
  Administered 2018-02-18: 1.2 ug/kg/h via INTRAVENOUS
  Filled 2018-02-17 (×5): qty 100

## 2018-02-17 MED ORDER — ORAL CARE MOUTH RINSE
15.0000 mL | OROMUCOSAL | Status: DC
  Administered 2018-02-17 – 2018-02-18 (×10): 15 mL via OROMUCOSAL

## 2018-02-17 MED ORDER — FENTANYL CITRATE (PF) 100 MCG/2ML IJ SOLN
50.0000 ug | INTRAMUSCULAR | Status: DC | PRN
  Administered 2018-02-17 (×2): 50 ug via INTRAVENOUS
  Filled 2018-02-17 (×2): qty 2

## 2018-02-17 MED ORDER — SUCCINYLCHOLINE CHLORIDE 20 MG/ML IJ SOLN
100.0000 mg | Freq: Once | INTRAMUSCULAR | Status: AC
  Administered 2018-02-17: 100 mg via INTRAVENOUS
  Filled 2018-02-17: qty 5

## 2018-02-17 MED ORDER — INSULIN ASPART 100 UNIT/ML ~~LOC~~ SOLN
0.0000 [IU] | SUBCUTANEOUS | Status: DC
  Administered 2018-02-17: 3 [IU] via SUBCUTANEOUS
  Administered 2018-02-17: 2 [IU] via SUBCUTANEOUS
  Administered 2018-02-18: 1 [IU] via SUBCUTANEOUS
  Administered 2018-02-18: 3 [IU] via SUBCUTANEOUS
  Administered 2018-02-18 (×2): 2 [IU] via SUBCUTANEOUS
  Administered 2018-02-18 (×2): 1 [IU] via SUBCUTANEOUS
  Administered 2018-02-19: 2 [IU] via SUBCUTANEOUS
  Administered 2018-02-19 (×2): 5 [IU] via SUBCUTANEOUS
  Administered 2018-02-19: 2 [IU] via SUBCUTANEOUS
  Administered 2018-02-19 – 2018-02-20 (×4): 3 [IU] via SUBCUTANEOUS

## 2018-02-17 MED ORDER — VANCOMYCIN HCL 10 G IV SOLR: 1500.0000 mg | INTRAVENOUS | Status: DC

## 2018-02-17 MED ORDER — SODIUM CHLORIDE 0.9 % IV SOLN
INTRAVENOUS | Status: DC
  Administered 2018-02-17: 15:00:00 via INTRAVENOUS

## 2018-02-17 MED ORDER — VANCOMYCIN HCL 10 G IV SOLR
2500.0000 mg | Freq: Once | INTRAVENOUS | Status: AC
  Administered 2018-02-17: 2500 mg via INTRAVENOUS
  Filled 2018-02-17: qty 2500

## 2018-02-17 NOTE — Consult Note (Signed)
Reason for Consult:  Right foot wound Referring Physician:  Dr. Karie Fetch is an 78 y.o. male.  HPI: The patient is a 78 year old male who is known to me from previous care of his lower extremity diabetic foot ulcers.  He underwent right second toe amputation 2 days ago for a nonhealing diabetic ulcer with underlying osteomyelitis.  He is admitted now to the intensive care unit for respiratory failure and elevated troponin.  By report he was not taking any of his medications recently.  He was taken to the emergency room by family members with altered mental status.  Past Medical History:  Diagnosis Date  . Arthritis   . Chronic renal insufficiency   . CKD (chronic kidney disease), stage III (HCC)   . COPD (chronic obstructive pulmonary disease) (HCC)    with restriction  . Coronary disease October 2010   Status post stenting of the proximal to mid right coronary with DES.  Status post extensive stenting of the proximal right coronary to the crux.  He has moderate disease in  the left coronary system  . Depression   . Diabetes mellitus    Insulin Dependent  . Diastolic dysfunction    Congestive heart failure with  . Dyspnea    multifactorial. chronic, 11/18/9- not an issue at this time  . Gout   . History of blood transfusion   . History of diabetic ulcer of foot    right  . Hypertension   . Hypoventilation    syndrome  . Obesity   . Pneumonia   . Pulmonary embolism [facility]) March 2010   hx of and DVT in March of 2010 on chronic Coumadin therapy  . Pulmonary hypertension (HCC)    Moderate to severe  . Retroperitoneal bleed    Hx of right pelvic  . Sleep apnea    Obstructive , no CPAP  . Wears dentures     Past Surgical History:  Procedure Laterality Date  . ABDOMINAL AORTOGRAM W/LOWER EXTREMITY Right 01/15/2018   Procedure: ABDOMINAL AORTOGRAM W/LOWER EXTREMITY;  Surgeon: Maeola Harman, MD;  Location: [facility] INVASIVE CV LAB;  Service: Cardiovascular;   Laterality: Right;  . AMPUTATION TOE Left 05/04/2017   Procedure: Left hallux amputation;  Surgeon: Toni Arthurs, MD;  Location: [facility];  Service: Orthopedics;  Laterality: Left;  . AMPUTATION TOE Right 12/21/2017   Procedure: Right hallux amputation;  Surgeon: Toni Arthurs, MD;  Location:  SURGERY CENTER;  Service: Orthopedics;  Laterality: Right;   . AMPUTATION TOE Right 02/15/2018   Procedure: Right 2nd toe amputation;  Surgeon: Toni Arthurs, MD;  Location: [facility] OR;  Service: Orthopedics;  Laterality: Right;  62  . BACK SURGERY  2000   post  . CARDIOVASCULAR STRESS TEST  11-14-2008   EF 44%  . EXPLORATORY LAPAROTOMY  1972   POST  . LOWER EXTREMITY ANGIOGRAPHY N/A 05/01/2017   Procedure: LOWER EXTREMITY ANGIOGRAPHY;  Surgeon: Maeola Harman, MD;  Location: [facility] INVASIVE CV LAB;  Service: Cardiovascular;  Laterality: N/A;  . LOWER EXTREMITY ANGIOGRAPHY N/A 05/15/2017   Procedure: LOWER EXTREMITY ANGIOGRAPHY;  Surgeon: Maeola Harman, MD;  Location: [facility] INVASIVE CV LAB;  Service: Cardiovascular;  Laterality: N/A;  . MULTIPLE TOOTH EXTRACTIONS    . PERIPHERAL VASCULAR ATHERECTOMY  05/01/2017   Procedure: PERIPHERAL VASCULAR ATHERECTOMY;  Surgeon: Maeola Harman, MD;  Location: [facility] INVASIVE CV LAB;  Service: Cardiovascular;;  Left SFA  . PERIPHERAL VASCULAR BALLOON ANGIOPLASTY  05/01/2017  Procedure: PERIPHERAL VASCULAR BALLOON ANGIOPLASTY;  Surgeon: Maeola Harman, MD;  Location: [facility] INVASIVE CV LAB;  Service: Cardiovascular;;  Left Anterial Tibial Artery  . PERIPHERAL VASCULAR BALLOON ANGIOPLASTY Right 01/15/2018   Procedure: PERIPHERAL VASCULAR BALLOON ANGIOPLASTY;  Surgeon: Maeola Harman, MD;  Location: [facility] INVASIVE CV LAB;  Service: Cardiovascular;  Laterality: Right;  SFA atherectomy  . PERIPHERAL VASCULAR INTERVENTION Right 05/15/2017   Procedure: PERIPHERAL VASCULAR INTERVENTION;  Surgeon: Maeola Harman, MD;  Location: [facility] INVASIVE CV LAB;  Service: Cardiovascular;  Laterality: Right;  SFA Stent  . US ECHOCARDIOGRAPHY  11-25-2008   EF 55-60%    Family History  Problem Relation Age of Onset  . Heart attack Mother   . Coronary artery disease Mother   . Hypertension Mother   . Coronary artery disease Father   . Heart attack Father     Social History:  reports that he quit smoking about 38 years ago. He quit after 2.00 years of use. He has never used smokeless tobacco. He reports previous alcohol use. He reports that he does not use drugs.  Allergies: No Known Allergies  Medications: I have reviewed the patient's current medications.  Results for orders placed or performed during the hospital encounter of 02/17/18 (from the past 48 hour(s))  Glucose, capillary     Status: Abnormal   Collection Time: 02/17/18  1:25 PM  Result Value Ref Range   Glucose-Capillary 196 (H) 70 - 99 mg/dL  I-STAT 3, arterial blood gas (G3+)     Status: Abnormal   Collection Time: 02/17/18  1:50 PM  Result Value Ref Range   pH, Arterial 7.223 (L) 7.350 - 7.450   pCO2 arterial 76.2 (HH) 32.0 - 48.0 mmHg   pO2, Arterial 135.0 (H) 83.0 - 108.0 mmHg   Bicarbonate 31.7 (H) 20.0 - 28.0 mmol/L   TCO2 34 (H) 22 - 32 mmol/L   O2 Saturation 98.0 %   Acid-Base Excess 2.0 0.0 - 2.0 mmol/L   Patient temperature 97.4 F    Collection site RADIAL, ALLEN'S TEST ACCEPTABLE    Drawn by RT    Sample type ARTERIAL    Comment NOTIFIED PHYSICIAN     No results found.  ROS: As above PE:  Blood pressure 115/61, pulse (!) 126, temperature (!) 97.4 F (36.3 C), temperature source Oral, resp. rate 18, SpO2 100 %. Overweight elderly male in no apparent distress.  He is resting comfortably in bed with oxygen by facemask.  He awakes but is very groggy.  He is not oriented.  Extraocular motions are intact.  Respirations are unlabored.  Right lower extremity dressing is removed.  There is a scant amount of fresh blood  at the operative incision.  The suture line is intact.  There is no significant swelling evident.  No signs of infection.  Diminished sensibility to light touch at the forefoot.  No active range of motion is observed.  Brisk capillary refill at the remaining third fourth and fifth toes.  Assessment/Plan: Status post right second toe amputation for chronic diabetic ulcer -today change the dressing and applied a new dry dressing and left the Mepitel layer intact.  He is safe to bear weight as tolerated on the right foot in a flat postop shoe.  We will continue to follow him periodically while he is admitted.  Toni Arthurs 02/17/2018, 2:20 PM

## 2018-02-17 NOTE — Progress Notes (Addendum)
ANTICOAGULATION CONSULT NOTE - Initial Consult  Pharmacy Consult for Heparin Indication: atrial fibrillation  No Known Allergies  Patient Measurements: Heparin Dosing Weight: 97.9kg  Vital Signs: Temp: 97.4 F (36.3 C) (01/18 1300) Temp Source: Oral (01/18 1300) BP: 115/53 (01/18 1524) Pulse Rate: 65 (01/18 1524)  Labs: Recent Labs    02/15/18 0749 02/17/18 1416  HGB  --  10.6*  HCT  --  35.7*  PLT  --  163  LABPROT 14.7 15.8*  INR 1.16 1.28  CREATININE  --  3.48*   Estimated Creatinine Clearance: 23.1 mL/min (A) (by C-G formula based on SCr of 3.48 mg/dL (H)).  Assessment: 3 yoM [facility] from OSH after a fall with h/o afib on warfarin PTA, recently held for 2nd toe amputation performed 1/16 (INR 1.16), discharged with BID enoxaparin bridge to therapeutic INR. Pharmacy now consulted for IV heparin dosing. Patient was transferred on IV heparin running at 2120 units/hr. Heparin level 0.95. Hgb 10.6, pltc WNL. INR 1.28. AST/ALT mildly elevated. No bleeding noted.  Goal of Therapy:  Heparin level 0.3-0.7 units/ml Monitor platelets by anticoagulation protocol: Yes   Plan:  Decrease heparin gtt to 1900 units/hr Recheck heparin level in 8 hours Daily heparin level and CBC Monitor s/sx of bleeding  Maddox Hlavaty N. Zigmund Daniel, PharmD, BCPS PGY2 Infectious Diseases Pharmacy Resident Phone: [phone] 02/17/2018,3:48 PM

## 2018-02-17 NOTE — Progress Notes (Signed)
Pharmacy Antibiotic Note  Paul Chen is a 78 y.o. male admitted on 02/17/2018 with acute on chronic respiratory failure, PMH s/f recent R 2nd toe amputation on 02/15/2018 d/t chronic nonhealing ulcer with underlying osteomyelitis. Pharmacy has been consulted for vancomycin and Zosyn dosing. WBC WNL, currently AF. Scr 3.48, estimated CrCl ~23 mL/min.  Plan: Vancomycin 2500mg  IV x1, then 1500mg  IV q48h (estimated AUC 457, goal 400-550) Zosyn 3.375g IV q8h (extended infusion) F/u clinical status, C&S, renal function, de-escalation, LOT, vancomycin levels as indicated  Temp (24hrs), Avg:97.4 F (36.3 C), Min:97.4 F (36.3 C), Max:97.4 F (36.3 C)  No results for input(s): WBC, CREATININE, LATICACIDVEN, VANCOTROUGH, VANCOPEAK, VANCORANDOM, GENTTROUGH, GENTPEAK, GENTRANDOM, TOBRATROUGH, TOBRAPEAK, TOBRARND, AMIKACINPEAK, AMIKACINTROU, AMIKACIN in the last 168 hours.  CrCl cannot be calculated (Patient's most recent lab result is older than the maximum 21 days allowed.).    No Known Allergies  Antimicrobials this admission: Vancomycin 1/18 >>  Zosyn 1/18 >>   Microbiology results: 1/18 BCx: sent 1/18 UCx: sent  1/18 MRSA PCR: sent  Thank you for allowing pharmacy to be a part of this patient's care.  Roderic Scarce Zigmund Daniel, PharmD, BCPS PGY2 Infectious Diseases Pharmacy Resident Phone: [phone] 02/17/2018 2:01 PM

## 2018-02-17 NOTE — H&P (Signed)
NAME:  Paul Chen, MRN:  [id number], DOB:  12-22-1940, LOS: 0 ADMISSION DATE:  02/17/2018,  REFERRING MD: [facility] emergency department physician [email or web address] COMPLAINT: Hypoxic respiratory failure  Brief History   Status post right second toe amputation 02/15/2018 status post fall on 02/17/2018 transferred to [facility] 02/17/2018 with hypercarbic hypoxic respiratory failure.Marland Kitchen  History of present illness   78 year old gentleman who is a former smoker and carries diagnosis of chronic obstructive pulmonary disease, poorly controlled diabetes mellitus, order artery disease status post stent in 2010, morbid obesity, congestive heart failure, atrial fibrillation with rapid ventricular response, and just had second toe amputation secondary to poorly controlled diabetes 02/15/2018 at [facility]. He presented to [facility] on a.m. 0 400 02/17/2018 secondary to a fall.  He was noted to be hypoxic.  He is also been a great deal of pain he was given morphine which caused him his blood pressure to fall out.  Was treated with IV fluids which led to increasing respiratory distress and need for noninvasive mechanical ventilatory support. He is transferred to [facility] and found to be hypoxic hypercarbic and with a respiratory acidosis.  He is got a plethora of health issues and may require intubation and further management mechanical ventilatory support.  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 Diabetes mellitus poorly controlled Hypertension Atrial fibrillation with biventricular response DVT with pulmonary embolism Generalized failure to thrive Morbid obesity Coronary artery disease status post stent  Significant Hospital Events   02/17/2018 transferred to [facility] from [facility] after a fall  Consults:    Procedures:    Significant Diagnostic Tests:    Micro Data:  02/17/2018 blood cultures x2>> 02/17/2018  urine culture>>  Antimicrobials:  02/17/2018 vancomycin>> 02/17/2018 Zosyn>>  Interim history/subjective:  78 year old male who underwent amputation of right foot second toe due to diabetic neuropathy.  Surgery was performed on 02/15/2017 by Dr. Victorino Dike.  He was discharged home and presented to [facility] on 02/17/2017 status post fall.  While in the hospital he was noted to have hypotension most notably after receiving morphine.  Was given fluid boluses which improved his blood pressure.  Is also noted to be in atrial febrile ventricular response.  Been sent home from [facility] on Lovenox bridge to Coumadin.  For his history of atrial fibrillation DVT and pulmonary embolism.  Poorly controlled diabetes mellitus.  He is a poor historian.  He has a known history obstructive sleep apnea questionable if he is compliant with CPAP at home. He was transferred to [facility] on 02/17/2018 while being placed on noninvasive mechanical ventilatory support.  Noted to be hypercarbic and hypoxic.  He is hemodynamically stable on arrival. He will be started on empirical antibiotics of vancomycin and Zosyn at this time.  And be pancultured for completeness.   Objective   There were no vitals taken for this visit.       No intake or output data in the 24 hours ending 02/17/18 1312 There were no vitals filed for this visit.  Examination: General: Obese male who is somewhat confused, currently on BiPAP.  Able to answer some questions HENT: Poor dentition noted.  No JVD or lymphadenopathy is appreciated Lungs: Decreased air movement is noted.  Currently on antibiotics mechanical ventilatory support with FiO2 50% creating O2 saturations of 100% and respiratory rate of 20 Cardiovascular: Heart sounds are regular irregular.  Atrial fibrillation with rapid ventricular response rate of  124 Abdomen: Obese, soft, positive bowel sounds, old midline scar is noted Extremities: Left lower extremity with  extensive changes of venous stasis positive edema multiple ulcers Right lower extremity with dressing that has evidence of bleeding missing toe second right. Neuro: Somewhat confused.  Is all extremities to command GU: No Foley at this time.   Right foot dressing   Left lower ext    Back ulcer Resolved Hospital Problem list     Assessment & Plan:  Hypoxic hypercarbic respiratory failure acute on chronic.  Noted to have a pH of 7.26 with a PCO2 of 67.  Most likely component of obstructive sleep apnea worsened by narcotic use for recent foot surgery. COPD former smoker Obstructive sleep apnea -Noninvasive mechanical auditory support as needed and mandatory at night secondary to known obstructive sleep apnea. -Careful with sedation -He may need intubation in the near future -Bronchodilators -No role for steroids  Atrial fibrillation with open ventricular response. History of coronary artery disease with stent to the proximal right coronary 2010 History of pulmonary embolism with history of deep vein thrombosis. -Twelve-lead EKG -Cycle cardiac enzymes -Beta-blockade -May need calcium channel blockade -Last 2D echo was on 11/25/2008 therefore we will repeat -He has been fluid resuscitated with a history of congestive heart failure therefore no further fluid resuscitation at this time -May consider cardiology consult near future -May consider CT of chest with angiogram rule out PE in near future  Status post recent fall.  CT performed at [facility] reveals no acute injury. 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  Performed with Dr. Victorino Dike with assistance from Alfredo Martinez. -We will remove dressing and visualize the wound -Empirical antibiotics -Check  procalcitonin -We will make orthopedic surgeon aware of his admission  Chronic renal insufficiency Lab Results  Component Value Date   CREATININE 1.30 (H) 01/15/2018   CREATININE 1.20 12/21/2017   CREATININE 1.27 08/09/2017   --Avoid nephrotoxins -Monitor creatinine   History of depression -Antidepressants as needed  Best practice:  Diet: N.p.o. Pain/Anxiety/Delirium protocol (if indicated): Narcotics as needed for surgical pain VAP protocol (if indicated): Not indicated DVT prophylaxis: Will be fully heparinized GI prophylaxis: Proton pump inhibitor Glucose control: Sliding scale insulin Mobility: Bedrest Code Status: Full code Family Communication: 02/17/2018 no family at bedside Disposition: Transferred from [facility] hospital to [facility] intensive care unit  Labs   CBC: No results for input(s): WBC, NEUTROABS, HGB, HCT, MCV, PLT in the last 168 hours.  Basic Metabolic Panel: No results for input(s): NA, K, CL, CO2, GLUCOSE, BUN, CREATININE, CALCIUM, MG, PHOS in the last 168 hours. GFR: CrCl cannot be calculated (Patient's most recent lab result is older than the maximum 21 days allowed.). No results for input(s): PROCALCITON, WBC, LATICACIDVEN in the last 168 hours.  Liver Function Tests: No results for input(s): AST, ALT, ALKPHOS, BILITOT, PROT, ALBUMIN in the last 168 hours. No results for input(s): LIPASE, AMYLASE in the last 168 hours. No results for input(s): AMMONIA in the last 168 hours.  ABG    Component Value Date/Time   PHART 7.371 03/03/2009 1039   PCO2ART 48.3 (H) 03/03/2009 1039   PO2ART 72.0 (L) 03/03/2009 1039   HCO3 28.0 (H) 03/03/2009 1039   HCO3 26.9 (H) 03/03/2009 1039   TCO2 33 (H) 01/15/2018 1129   O2SAT 94.0  03/03/2009 1039   O2SAT 56.0 03/03/2009 1039     Coagulation Profile: Recent Labs  Lab 02/15/18 0749  INR 1.16    Cardiac Enzymes: No results for input(s): CKTOTAL, CKMB, CKMBINDEX, TROPONINI in the last 168  hours.  HbA1C: Hgb A1c MFr Bld  Date/Time Value Ref Range Status  08/09/2017 02:02 PM 6.6 (H) 4.8 - 5.6 % Final    Comment:             Prediabetes: 5.7 - 6.4          Diabetes: >6.4          Glycemic control for adults with diabetes: <7.0     CBG: Recent Labs  Lab 02/15/18 0716 02/15/18 0947  GLUCAP 231* 212*    Review of Systems:   Not available secondary to confusion.  Past Medical History  He,  has a past medical history of Arthritis, Chronic renal insufficiency, CKD (chronic kidney disease), stage III (HCC), COPD (chronic obstructive pulmonary disease) (HCC), Coronary disease (October 2010), Depression, Diabetes mellitus, Diastolic dysfunction, Dyspnea, Gout, History of blood transfusion, History of diabetic ulcer of foot, Hypertension, Hypoventilation, Obesity, Pneumonia, Pulmonary embolism [facility]) (March 2010), Pulmonary hypertension [facility]), Retroperitoneal bleed, Sleep apnea, and Wears dentures.   Surgical History    Past Surgical History:  Procedure Laterality Date  . ABDOMINAL AORTOGRAM W/LOWER EXTREMITY Right 01/15/2018   Procedure: ABDOMINAL AORTOGRAM W/LOWER EXTREMITY;  Surgeon: Maeola Harman, MD;  Location: [facility] INVASIVE CV LAB;  Service: Cardiovascular;  Laterality: Right;  . AMPUTATION TOE Left 05/04/2017   Procedure: Left hallux amputation;  Surgeon: Toni Arthurs, MD;  Location: [facility];  Service: Orthopedics;  Laterality: Left;  . AMPUTATION TOE Right 12/21/2017   Procedure: Right hallux amputation;  Surgeon: Toni Arthurs, MD;  Location: [facility];  Service: Orthopedics;  Laterality: Right;   . AMPUTATION TOE Right 02/15/2018   Procedure: Right 2nd toe amputation;  Surgeon: Toni Arthurs, MD;  Location: [facility] OR;  Service: Orthopedics;  Laterality: Right;  60  . BACK SURGERY  2000   post  . CARDIOVASCULAR STRESS TEST  11-14-2008   EF 44%  . EXPLORATORY LAPAROTOMY  1972   POST  . LOWER EXTREMITY ANGIOGRAPHY N/A  05/01/2017   Procedure: LOWER EXTREMITY ANGIOGRAPHY;  Surgeon: Maeola Harman, MD;  Location: [facility] INVASIVE CV LAB;  Service: Cardiovascular;  Laterality: N/A;  . LOWER EXTREMITY ANGIOGRAPHY N/A 05/15/2017   Procedure: LOWER EXTREMITY ANGIOGRAPHY;  Surgeon: Maeola Harman, MD;  Location: [facility] INVASIVE CV LAB;  Service: Cardiovascular;  Laterality: N/A;  . MULTIPLE TOOTH EXTRACTIONS    . PERIPHERAL VASCULAR ATHERECTOMY  05/01/2017   Procedure: PERIPHERAL VASCULAR ATHERECTOMY;  Surgeon: Maeola Harman, MD;  Location: [facility] INVASIVE CV LAB;  Service: Cardiovascular;;  Left SFA  . PERIPHERAL VASCULAR BALLOON ANGIOPLASTY  05/01/2017   Procedure: PERIPHERAL VASCULAR BALLOON ANGIOPLASTY;  Surgeon: Maeola Harman, MD;  Location: [facility] INVASIVE CV LAB;  Service: Cardiovascular;;  Left Anterial Tibial Artery  . PERIPHERAL VASCULAR BALLOON ANGIOPLASTY Right 01/15/2018   Procedure: PERIPHERAL VASCULAR BALLOON ANGIOPLASTY;  Surgeon: Maeola Harman, MD;  Location: [facility] INVASIVE CV LAB;  Service: Cardiovascular;  Laterality: Right;  SFA atherectomy  . PERIPHERAL VASCULAR INTERVENTION Right 05/15/2017   Procedure: PERIPHERAL VASCULAR INTERVENTION;  Surgeon: Maeola Harman, MD;  Location: [facility] INVASIVE CV LAB;  Service: Cardiovascular;  Laterality: Right;  SFA Stent  . US ECHOCARDIOGRAPHY  11-25-2008   EF 55-60%     Social History  reports that he quit smoking about 38 years ago. He quit after 2.00 years of use. He has never used smokeless tobacco. He reports previous alcohol use. He reports that he does not use drugs.   Family History   His family history includes Coronary artery disease in his father and mother; Heart attack in his father and mother; Hypertension in his mother.   Allergies No Known Allergies   Home Medications  Prior to Admission medications   Medication Sig Start Date End Date Taking? Authorizing Provider  albuterol (PROVENTIL) (2.5 MG/3ML)  0.083% nebulizer solution Take 2.5 mg by nebulization every 4 (four) hours as needed for wheezing or shortness of breath.  12/05/16   [provider]  allopurinol (ZYLOPRIM) 300 MG tablet Take 300 mg by mouth daily.  02/21/15   [provider]  amLODipine (NORVASC) 5 MG tablet Take 1 tablet (5 mg total) by mouth daily. 08/09/17 08/04/18  [place]Jordan, Peter M, MD  atorvastatin (LIPITOR) 40 MG tablet Take 1 tablet (40 mg total) by mouth daily. 01/07/13   [place], Peter M, MD  busPIRone (BUSPAR) 15 MG tablet Take 15 mg by mouth 2 (two) times daily.  12/04/12   [provider]  cephALEXin (KEFLEX) 500 MG capsule Take 500 mg by mouth 4 (four) times daily.    [provider]  ciclopirox (PENLAC) 8 % solution Apply 1 application topically at bedtime. Apply over nail and surrounding skin. Apply daily over previous coat. After seven (7) days, may remove with alcohol and continue cycle.    [provider]  clopidogrel (PLAVIX) 75 MG tablet TAKE ONE TABLET BY MOUTH EVERY MORNING 04/17/17   [place]andJordan, Peter M, MD  doxazosin (CARDURA) 4 MG tablet Take 4 mg by mouth every evening.     [provider]  DULoxetine (CYMBALTA) 60 MG capsule Take 60 mg by mouth daily.      [provider]  enoxaparin (LOVENOX) 120 MG/0.8ML injection Inject 0.8 mLs (120 mg total) into the skin every 12 (twelve) hours for 3 days. Patient taking differently: Inject 120 mg into the skin every 12 (twelve) hours.  01/12/18 02/12/18  Maeola Harmanain, Brandon Christopher, MD  FLOVENT HFA 44 MCG/ACT inhaler Inhale 3 puffs into the lungs 2 (two) times daily.  05/09/11   [provider]  furosemide (LASIX) 20 MG tablet Take 40 mg by mouth daily.  02/21/15   [provider]  gabapentin (NEURONTIN) 300 MG capsule Take 600 mg by mouth 2 (two) times daily.    [provider]  glucagon 1 MG injection Inject 1 mg into the vein once as needed (for low blood sugars.).     [provider]   Insulin Glargine (BASAGLAR KWIKPEN) 100 UNIT/ML SOPN Inject 40 Units into the skin daily.    [provider]  ipratropium (ATROVENT) 0.02 % nebulizer solution Take 0.5 mg by nebulization every 4 (four) hours as needed for wheezing or shortness of breath.     [provider]  lisinopril (PRINIVIL,ZESTRIL) 10 MG tablet Take 10 mg by mouth daily. 04/19/17   [provider]  Magnesium Oxide 400 (240 Mg) MG TABS Take 400 mg by mouth 2 (two) times daily. 04/19/17   [provider]  metoprolol succinate (TOPROL-XL) 100 MG 24 hr tablet Take 150 mg by mouth daily.  10/18/12   [provider]  Multiple Vitamins-Minerals (CENTRUM SILVER ULTRA MENS PO) Take 1 tablet by mouth daily. 04/19/17   [provider]  naloxone Jonelle Sports(NARCAN) nasal  spray 4 mg/0.1 mL Place 1 spray into the nose as needed (opioid overdose).     [provider]  nitroGLYCERIN (NITROSTAT) 0.4 MG SL tablet Place 0.4 mg under the tongue every 5 (five) minutes as needed for chest pain.     [provider]  NOVOLOG FLEXPEN 100 UNIT/ML FlexPen Inject 8-12 Units into the skin 2 (two) times daily with a meal.  12/11/17   [provider]  Omega-3 Fatty Acids (FISH OIL) 1000 MG CAPS Take 1,000 mg by mouth at bedtime.    [provider]  oxyCODONE (ROXICODONE) 15 MG immediate release tablet Take 15 mg by mouth 4 (four) times daily as needed for pain.     [provider]  oxyCODONE ER (XTAMPZA ER) 36 MG C12A Take 36 mg by mouth every 12 (twelve) hours.    [provider]  silver sulfADIAZINE (SILVADENE) 1 % cream Apply 1 application topically daily as needed (wound care). apply to toes    [provider]  warfarin (COUMADIN) 2.5 MG tablet Take 2.5 mg by mouth every evening.  02/21/15   [provider]     Critical care time: 45 min app     Brett Canales Dameion Briles ACNP Adolph Pollack PCCM Pager [phone] till 1 pm If no answer page [phone] 02/17/2018, 1:12 PM

## 2018-02-17 NOTE — Procedures (Signed)
Intubation Procedure Note ANDRIY SHERK [id number] 1940/12/25  Procedure: Intubation Indications: Respiratory insufficiency  Procedure Details Consent: Unable to obtain consent because of altered level of consciousness. Time Out: Verified patient identification, verified procedure, site/side was marked, verified correct patient position, special equipment/implants available, medications/allergies/relevent history reviewed, required imaging and test results available.  Performed  Maximum sterile technique was used including antiseptics, cap, gloves, gown, hand hygiene, mask and sheet.  MAC and 4    Evaluation Hemodynamic Status: BP stable throughout; O2 sats: stable throughout Patient's Current Condition: stable Complications: No apparent complications Patient did tolerate procedure well. Chest X-ray ordered to verify placement.  CXR: pending.   Einar Grad Noma Quijas 02/17/2018

## 2018-02-17 NOTE — Progress Notes (Signed)
Pt w/ increasing AMS, now becoming agitated.  Relayed to Dr. Denese Killings, plan to intubate.

## 2018-02-17 NOTE — Progress Notes (Signed)
Critical ABG values given to Dr. Denese Killings.

## 2018-02-17 NOTE — Progress Notes (Signed)
Pt has a hx of DM2 and no orders to check CBGs or to cover sugars. [facility] called and notified. Will await new orders and continue to monitor closely.  Caswell Corwin, RN 02/17/18 7:53 PM

## 2018-02-17 NOTE — Progress Notes (Signed)
Rt notified W. Minor, NP for critical ABG results.

## 2018-02-18 ENCOUNTER — Inpatient Hospital Stay (HOSPITAL_COMMUNITY): Payer: Medicare Other

## 2018-02-18 DIAGNOSIS — R0602 Shortness of breath: Secondary | ICD-10-CM

## 2018-02-18 DIAGNOSIS — I214 Non-ST elevation (NSTEMI) myocardial infarction: Secondary | ICD-10-CM

## 2018-02-18 LAB — URINE CULTURE: Culture: NO GROWTH

## 2018-02-18 LAB — BASIC METABOLIC PANEL
Anion gap: 11 (ref 5–15)
BUN: 42 mg/dL — ABNORMAL HIGH (ref 8–23)
CO2: 24 mmol/L (ref 22–32)
Calcium: 8 mg/dL — ABNORMAL LOW (ref 8.9–10.3)
Chloride: 108 mmol/L (ref 98–111)
Creatinine, Ser: 2.81 mg/dL — ABNORMAL HIGH (ref 0.61–1.24)
GFR calc Af Amer: 24 mL/min — ABNORMAL LOW (ref >60)
GFR calc non Af Amer: 21 mL/min — ABNORMAL LOW (ref >60)
Glucose, Bld: 176 mg/dL — ABNORMAL HIGH (ref 70–99)
Potassium: 3.6 mmol/L (ref 3.5–5.1)
Sodium: 143 mmol/L (ref 135–145)

## 2018-02-18 LAB — CBC
HCT: 33.7 % — ABNORMAL LOW (ref 39.0–52.0)
Hemoglobin: 10.7 g/dL — ABNORMAL LOW (ref 13.0–17.0)
MCH: 30 pg (ref 26.0–34.0)
MCHC: 31.8 g/dL (ref 30.0–36.0)
MCV: 94.4 fL (ref 80.0–100.0)
Platelets: 179 10*3/uL (ref 150–400)
RBC: 3.57 MIL/uL — ABNORMAL LOW (ref 4.22–5.81)
RDW: 14.9 % (ref 11.5–15.5)
WBC: 11 10*3/uL — ABNORMAL HIGH (ref 4.0–10.5)
nRBC: 0 % (ref 0.0–0.2)

## 2018-02-18 LAB — ECHOCARDIOGRAM COMPLETE: Height: 70 in

## 2018-02-18 LAB — TROPONIN I
Troponin I: 1.27 ng/mL (ref <0.03)
Troponin I: 1.49 ng/mL (ref <0.03)
Troponin I: 0.83 ng/mL (ref <0.03)

## 2018-02-18 LAB — GLUCOSE, CAPILLARY
Glucose-Capillary: 151 mg/dL — ABNORMAL HIGH (ref 70–99)
Glucose-Capillary: 162 mg/dL — ABNORMAL HIGH (ref 70–99)
Glucose-Capillary: 123 mg/dL — ABNORMAL HIGH (ref 70–99)
Glucose-Capillary: 150 mg/dL — ABNORMAL HIGH (ref 70–99)
Glucose-Capillary: 148 mg/dL — ABNORMAL HIGH (ref 70–99)
Glucose-Capillary: 216 mg/dL — ABNORMAL HIGH (ref 70–99)

## 2018-02-18 LAB — APTT: aPTT: >200 seconds (ref 24–36)

## 2018-02-18 LAB — HEPARIN LEVEL (UNFRACTIONATED)
Heparin Unfractionated: 0.79 IU/mL — ABNORMAL HIGH (ref 0.30–0.70)
Heparin Unfractionated: 0.61 IU/mL (ref 0.30–0.70)

## 2018-02-18 MED ORDER — ORAL CARE MOUTH RINSE
15.0000 mL | Freq: Two times a day (BID) | OROMUCOSAL | Status: DC
  Administered 2018-02-18 – 2018-02-27 (×13): 15 mL via OROMUCOSAL

## 2018-02-18 MED ORDER — AMLODIPINE BESYLATE 5 MG PO TABS: 5.0000 mg | ORAL_TABLET | Freq: Every day | ORAL | Status: DC

## 2018-02-18 MED ORDER — FUROSEMIDE 10 MG/ML IJ SOLN
80.0000 mg | Freq: Once | INTRAMUSCULAR | Status: AC
  Administered 2018-02-18: 80 mg via INTRAVENOUS
  Filled 2018-02-18: qty 8

## 2018-02-18 MED ORDER — DULOXETINE HCL 60 MG PO CPEP
60.0000 mg | ORAL_CAPSULE | Freq: Every day | ORAL | Status: DC
  Administered 2018-02-19 – 2018-02-24 (×6): 60 mg via ORAL
  Filled 2018-02-18 (×6): qty 1

## 2018-02-18 MED ORDER — HYDRALAZINE HCL 20 MG/ML IJ SOLN
5.0000 mg | Freq: Four times a day (QID) | INTRAMUSCULAR | Status: DC | PRN
  Administered 2018-02-18: 5 mg via INTRAVENOUS
  Filled 2018-02-18: qty 1

## 2018-02-18 MED ORDER — HYDROMORPHONE HCL 1 MG/ML IJ SOLN
0.5000 mg | INTRAMUSCULAR | Status: DC | PRN
  Administered 2018-02-18: 0.5 mg via INTRAVENOUS
  Filled 2018-02-18: qty 0.5

## 2018-02-18 MED ORDER — METOPROLOL TARTRATE 5 MG/5ML IV SOLN
5.0000 mg | Freq: Once | INTRAVENOUS | Status: AC
  Administered 2018-02-18: 5 mg via INTRAVENOUS

## 2018-02-18 MED ORDER — GABAPENTIN 300 MG PO CAPS
600.0000 mg | ORAL_CAPSULE | Freq: Two times a day (BID) | ORAL | Status: DC
  Administered 2018-02-19 – 2018-02-27 (×17): 600 mg via ORAL
  Filled 2018-02-18 (×18): qty 2

## 2018-02-18 MED ORDER — CALCIUM GLUCONATE-NACL 1-0.675 GM/50ML-% IV SOLN
1.0000 g | Freq: Once | INTRAVENOUS | Status: AC
  Administered 2018-02-18: 1000 mg via INTRAVENOUS
  Filled 2018-02-18: qty 50

## 2018-02-18 MED ORDER — HYDROMORPHONE HCL 1 MG/ML IJ SOLN
1.0000 mg | INTRAMUSCULAR | Status: DC | PRN
  Administered 2018-02-18 – 2018-02-19 (×6): 1 mg via INTRAVENOUS
  Filled 2018-02-18 (×6): qty 1

## 2018-02-18 MED ORDER — OXYCODONE HCL 5 MG PO TABS
15.0000 mg | ORAL_TABLET | Freq: Four times a day (QID) | ORAL | Status: DC | PRN
  Administered 2018-02-18: 15 mg via ORAL
  Filled 2018-02-18: qty 3

## 2018-02-18 MED ORDER — METOPROLOL TARTRATE 5 MG/5ML IV SOLN
INTRAVENOUS | Status: AC
  Filled 2018-02-18: qty 5

## 2018-02-18 MED ORDER — HYDRALAZINE HCL 20 MG/ML IJ SOLN
10.0000 mg | Freq: Four times a day (QID) | INTRAMUSCULAR | Status: DC | PRN
  Administered 2018-02-18: 10 mg via INTRAVENOUS
  Filled 2018-02-18: qty 1

## 2018-02-18 MED ORDER — ALLOPURINOL 300 MG PO TABS
300.0000 mg | ORAL_TABLET | Freq: Every day | ORAL | Status: DC
  Administered 2018-02-19 – 2018-02-27 (×9): 300 mg via ORAL
  Filled 2018-02-18 (×9): qty 1

## 2018-02-18 NOTE — Progress Notes (Signed)
   Subjective:    Recheck right foot s/p right 2nd toe amputation and recent respiratory failure after getting home Currently patient is intubated and sedated Discussed prognosis with daughter Social work has been contacted about living arrangements   Objective:   VITALS:   Vitals:   02/18/18 0630 02/18/18 0655  BP: (!) 169/59 (!) 172/64  Pulse: (!) 52 (!) 52  Resp: (!) 22 19  Temp:    SpO2: 99% 96%    Right foot with fresh dressing to 2nd toe  No drainage or erythema Cap refill less than 2 seconds in right foot   LABS Recent Labs    02/17/18 1416 02/18/18 0232  HGB 10.6* 10.7*  HCT 35.7* 33.7*  WBC 10.4 11.0*  PLT 163 179    Recent Labs    02/17/18 1416 02/18/18 0232  NA 142 143  K 4.0 3.6  BUN 46* 42*  CREATININE 3.48* 2.81*  GLUCOSE 215* 176*     Assessment/Plan:   s/p right 2nd toe amputation with respiratory failure Will continue to monitor his progress Recommend daily dressing change per Dr. Victorino Dike leaving the Mepitel layer intact Discussed with daughter who is at bedside     Paul Overall PA-C, MPAS [facility] is now [facility]  [place] [address], [address], [place], [place] [address] Phone: [phone] [email or web address] Facebook  [employer]

## 2018-02-18 NOTE — Progress Notes (Addendum)
Pts HR continues to slowly trend down, now 45-49, despite decrease in precedex rate.  Precedex turned off, fentanyl turned off. Pts troponin: 1.27.  All relayed to [place], CCM Resident.  Pt on PS all morning, pulling good volumes, however starting to have periods of apnea. RT called to bedside, pt switched back to full support. All sedation now off. Will continue to monitor closely

## 2018-02-18 NOTE — Procedures (Signed)
Extubation Procedure Note  Patient Details:   Name: Paul Chen DOB: 06-01-1940 MRN: [id number]   Airway Documentation:    Vent end date: 02/18/18 Vent end time: 1720   Evaluation  O2 sats: stable throughout Complications: No apparent complications Patient did tolerate procedure well. Bilateral Breath Sounds: Clear, Diminished   Yes  Melanee Spryelson, Cirilo Canner Lawson 02/18/2018, 5:24 PM

## 2018-02-18 NOTE — Consult Note (Addendum)
Cardiology Consultation:   Patient ID: Paul Chen MRN: [id number]; DOB: October 24, 1940  Admit date: 02/17/2018 Date of Consult: 02/18/2018  Primary Care Provider: Coy Saunas, MD Primary Cardiologist: Peter Martinique, MD  Primary Electrophysiologist:  None    Patient Profile:   Paul Chen is a 78 y.o. male with a hx of CAD, COPD, IDDM, PAD, history of DVT and PE in 2010, moderate to severe pulmonary hypertension, obstructive sleep apnea, CKD stage III and morbid obesity who is being seen today for the evaluation of elevated trop while intubated for respiratory distress at the request of Dr. Halford Chessman.  History of Present Illness:   Paul Chen is a 78 y.o. male with a hx of CAD, COPD, IDDM, PAD, history of DVT and PE in 2010, moderate to severe pulmonary hypertension, obstructive sleep apnea, CKD stage III and morbid obesity.  He had DES x3 to proximal to mid RCA in 2010.  Cardiac catheterization in 2011 showed patent stents.  He is on chronic Plavix and Coumadin therapy for history of PE and CAD.  Previous carotid Doppler in April 2018 showed moderate right ICA disease and mild left ICA disease.  He underwent a stress test in June 2018 which showed EF of 52%, small inferoapical defect that improves during recovery, this does not represent significant ischemia and that may represent soft tissue attenuation, overall considered low risk scan.  He was followed by [facility] for peripheral arterial disease.  He has known lower extremity arterial disease.  Previously he underwent revascularization of left SFA with PTA which resulted in dissection and a subsequent stenting in April 2019.  He had left hallux amputation also in April 2019.  He was admitted for osteomyelitis of right great toe in June 2019.  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  He was subsequently discharged on the same day.  According to his daughter, it was his sister who drove him home.  On their  way home, he did choke on a piece of food.  EMS was called but he was not sent to the hospital.  After he got home, he went right to sleep.  The following morning, family found him very confused, pale and was not responding to questions.  He was sent to [facility] ED for further evaluation.  On arrival, he was noted to be hypoxic.  He was in significant amount of pain and was given morphine.  His blood pressure subsequently fell to the 80s.  He was treated with IV fluid which led to increasing respiratory distress and the need for noninvasive mechanical ventilatory support.  CT of the head was negative for acute process.  Portable chest x-ray obtained in [facility] was also negative for acute process.  During the ED visit, he did have atrial fibrillation with RVR with heart rate of 120.  This is new to him.  He was subsequently transferred to [facility] for acute respiratory failure with respiratory acidosis.  He is currently intubated however alert and following command.  On arrival to [facility], significant lab work include creatinine of 3.48, whereas his baseline creatinine is about 1.3.  Hemoglobin of 10.6.  Blood culture are negative.  Chest x-ray showed low lung volumes with developing bibasilar atelectasis, possible trace left pleural effusion, appropriate position of endotracheal tube.  His troponin was also elevated at 1.7 and at 1.49.  EKG showed diffuse T wave inversion.  Cardiology was consulted for elevated troponin.  Since  admission, his renal function has been improving.  His blood pressure has also improved as well.  Talking with the patient, he was able to follow commands and raise his thumb on both side of the body.  He denies any recent chest pain however did admit to have some shortness of breath prior to arrival.    Past Medical History:  Diagnosis Date  . Arthritis   . Chronic renal insufficiency   . CKD (chronic kidney disease), stage III ([place])   . COPD  (chronic obstructive pulmonary disease) ([place])    with restriction  . Coronary disease October 2010   Status post stenting of the proximal to mid right coronary with DES.  Status post extensive stenting of the proximal right coronary to the crux.  He has moderate disease in  the left coronary system  . Depression   . Diabetes mellitus    Insulin Dependent  . Diastolic dysfunction    Congestive heart failure with  . Dyspnea    multifactorial. chronic, 11/18/9- not an issue at this time  . Gout   . History of blood transfusion   . History of diabetic ulcer of foot    right  . Hypertension   . Hypoventilation    syndrome  . Obesity   . Pneumonia   . Pulmonary embolism [facility]) March 2010   hx of and DVT in March of 2010 on chronic Coumadin therapy  . Pulmonary hypertension (HCC)    Moderate to severe  . Retroperitoneal bleed    Hx of right pelvic  . Sleep apnea    Obstructive , no CPAP  . Wears dentures     Past Surgical History:  Procedure Laterality Date  . ABDOMINAL AORTOGRAM W/LOWER EXTREMITY Right 01/15/2018   Procedure: ABDOMINAL AORTOGRAM W/LOWER EXTREMITY;  Surgeon: Waynetta Sandy, MD;  Location: [place] [facility];  Service: Cardiovascular;  Laterality: Right;  . AMPUTATION TOE Left 05/04/2017   Procedure: Left hallux amputation;  Surgeon: Wylene Simmer, MD;  Location: [place];  Service: Orthopedics;  Laterality: Left;  . AMPUTATION TOE Right 12/21/2017   Procedure: Right hallux amputation;  Surgeon: Wylene Simmer, MD;  Location: [place];  Service: Orthopedics;  Laterality: Right;  48mn  . AMPUTATION TOE Right 02/15/2018   Procedure: Right 2nd toe amputation;  Surgeon: HWylene Simmer MD;  [place]  Service: Orthopedics;  Laterality: Right;  60  . BACK SURGERY  2000   post  . CARDIOVASCULAR STRESS TEST  11-14-2008   EF 44%  . EXPLORATORY LAPAROTOMY  1972   POST  . LOWER EXTREMITY ANGIOGRAPHY N/A 05/01/2017   Procedure:  LOWER EXTREMITY ANGIOGRAPHY;  Surgeon: CWaynetta Sandy MD;  [place];  Service: Cardiovascular;  Laterality: N/A;  . LOWER EXTREMITY ANGIOGRAPHY N/A 05/15/2017   Procedure: LOWER EXTREMITY ANGIOGRAPHY;  Surgeon: CWaynetta Sandy MD;  [place];  Service: Cardiovascular;  Laterality: N/A;  . MULTIPLE TOOTH EXTRACTIONS    . PERIPHERAL VASCULAR ATHERECTOMY  05/01/2017   Procedure: PERIPHERAL VASCULAR ATHERECTOMY;  Surgeon: CWaynetta Sandy MD;  [place];  Service: Cardiovascular;;  Left SFA  . PERIPHERAL VASCULAR BALLOON ANGIOPLASTY  05/01/2017   Procedure: PERIPHERAL VASCULAR BALLOON ANGIOPLASTY;  Surgeon: CWaynetta Sandy MD;  [place];  Service: Cardiovascular;;  Left Anterial Tibial Artery  . PERIPHERAL VASCULAR BALLOON ANGIOPLASTY Right 01/15/2018   Procedure: PERIPHERAL VASCULAR BALLOON ANGIOPLASTY;  Surgeon: CServando Snare  Harrell Gave, MD;  Location: [place] [facility];  Service: Cardiovascular;  Laterality: Right;  SFA atherectomy  . PERIPHERAL VASCULAR INTERVENTION Right 05/15/2017   Procedure: PERIPHERAL VASCULAR INTERVENTION;  Surgeon: Waynetta Sandy, MD;  Location: [place] [facility];  Service: Cardiovascular;  Laterality: Right;  SFA Stent  . US ECHOCARDIOGRAPHY  11-25-2008   EF 55-60%     Home Medications:  Prior to Admission medications   Medication Sig Start Date End Date Taking? Authorizing Provider  albuterol (PROVENTIL) (2.5 MG/3ML) 0.083% nebulizer solution Take 2.5 mg by nebulization every 4 (four) hours as needed for wheezing or shortness of breath.  12/05/16  Yes [provider]  allopurinol (ZYLOPRIM) 300 MG tablet Take 300 mg by mouth daily.  02/21/15  Yes [provider]  amLODipine (NORVASC) 5 MG tablet Take 1 tablet (5 mg total) by mouth daily. 08/09/17 08/04/18 Yes Martinique, Peter M, MD  atorvastatin (LIPITOR) 40 MG tablet Take 1 tablet  (40 mg total) by mouth daily. 01/07/13  Yes Martinique, Peter M, MD  busPIRone (BUSPAR) 15 MG tablet Take 15 mg by mouth 2 (two) times daily.  12/04/12  Yes [provider]  clopidogrel (PLAVIX) 75 MG tablet TAKE ONE TABLET BY MOUTH EVERY MORNING 04/17/17  Yes Martinique, Peter M, MD  doxazosin (CARDURA) 4 MG tablet Take 4 mg by mouth every evening.    Yes [provider]  DULoxetine (CYMBALTA) 60 MG capsule Take 60 mg by mouth daily.     Yes [provider]  furosemide (LASIX) 20 MG tablet Take 40 mg by mouth daily.  02/21/15  Yes [provider]  gabapentin (NEURONTIN) 300 MG capsule Take 600 mg by mouth 2 (two) times daily.   Yes [provider]  Insulin Glargine (BASAGLAR KWIKPEN) 100 UNIT/ML SOPN Inject 40 Units into the skin daily.   Yes [provider]  ipratropium (ATROVENT) 0.02 % nebulizer solution Take 0.5 mg by nebulization every 4 (four) hours as needed for wheezing or shortness of breath.    Yes [provider]  liraglutide (VICTOZA) 18 MG/3ML SOPN Inject 0.3 mg into the skin daily.   Yes [provider]  lisinopril (PRINIVIL,ZESTRIL) 10 MG tablet Take 10 mg by mouth daily. 04/19/17  Yes [provider]  Magnesium Oxide 400 (240 Mg) MG TABS Take 400 mg by mouth 2 (two) times daily. 04/19/17  Yes [provider]  metoprolol succinate (TOPROL-XL) 100 MG 24 hr tablet Take 150 mg by mouth daily.  10/18/12  Yes [provider]  Multiple Vitamins-Minerals (CENTRUM SILVER ULTRA MENS PO) Take 1 tablet by mouth daily. 04/19/17  Yes [provider]  nitroGLYCERIN (NITROSTAT) 0.4 MG SL tablet Place 0.4 mg under the tongue every 5 (five) minutes as needed for chest pain.    Yes [provider]  NOVOLOG FLEXPEN 100 UNIT/ML FlexPen Inject 8-12 Units into the skin 2 (two) times daily with a meal.  12/11/17  Yes [provider]  Omega-3 Fatty Acids (FISH OIL) 1000 MG CAPS Take 1,000 mg by mouth  at bedtime.   Yes [provider]  oxyCODONE (ROXICODONE) 15 MG immediate release tablet Take 15 mg by mouth 4 (four) times daily as needed for pain.    Yes [provider]  silver sulfADIAZINE (SILVADENE) 1 % cream Apply 1 application topically daily as needed (wound care). apply to toes   Yes [provider]  warfarin (COUMADIN) 2.5 MG tablet Take 2.5 mg by mouth every evening.  02/21/15  Yes [provider]  cephALEXin (KEFLEX) 500 MG capsule Take 500 mg by mouth 4 (four) times daily.    [provider]  ciclopirox (PENLAC) 8 % solution Apply 1 application topically at bedtime. Apply over nail and surrounding skin. Apply daily over previous coat. After seven (7) days, may remove with alcohol and continue cycle.    [provider]  enoxaparin (LOVENOX) 120 MG/0.8ML injection Inject 0.8 mLs (120 mg total) into the skin every 12 (twelve) hours for 3 days. Patient taking differently: Inject 120 mg into the skin every 12 (twelve) hours.  01/12/18 02/12/18  Waynetta Sandy, MD  FLOVENT HFA 44 MCG/ACT inhaler Inhale 3 puffs into the lungs 2 (two) times daily.  05/09/11   [provider]  glucagon 1 MG injection Inject 1 mg into the vein once as needed (for low blood sugars.).     [provider]  naloxone [facility]) nasal spray 4 mg/0.1 mL Place 1 spray into the nose as needed (opioid overdose).     [provider]  oxyCODONE ER (XTAMPZA ER) 36 MG C12A Take 36 mg by mouth every 12 (twelve) hours.    [provider]    Inpatient Medications: Scheduled Meds: . amLODipine  5 mg Oral Daily  . chlorhexidine gluconate (MEDLINE KIT)  15 mL Mouth Rinse BID  . insulin aspart  0-9 Units Subcutaneous Q4H  . mouth rinse  15 mL Mouth Rinse 10 times per day  . pantoprazole (PROTONIX) IV  40 mg Intravenous QHS   Continuous Infusions: . sodium chloride Stopped (02/18/18 0523)  . dexmedetomidine (PRECEDEX) IV infusion  Stopped (02/18/18 1005)  . fentaNYL infusion INTRAVENOUS 100 mcg/hr (02/18/18 1629)  . heparin 1,600 Units/hr (02/18/18 1551)  . phenylephrine (NEO-SYNEPHRINE) Adult infusion     PRN Meds: fentaNYL, hydrALAZINE  Allergies:   No Known Allergies  Social History:   Social History   Socioeconomic History  . Marital status: Divorced    Spouse name: Not on file  . Number of children: 3  . Years of education: Not on file  . Highest education level: Not on file  Occupational History  . Occupation: disabled    Employer: RETIRED  Social Needs  . Financial resource strain: Not on file  . Food insecurity:    Worry: Not on file    Inability: Not on file  . Transportation needs:    Medical: Not on file    Non-medical: Not on file  Tobacco Use  . Smoking status: Former Smoker    Years: 2.00    Last attempt to quit: 02/01/1980    Years since quitting: 38.0  . Smokeless tobacco: Never Used  Substance and Sexual Activity  . Alcohol use: Not Currently  . Drug use: Never  . Sexual activity: Not on file  Lifestyle  . Physical activity:    Days per week: Not on file    Minutes per session: Not on file  . Stress: Not on file  Relationships  . Social connections:    Talks on phone: Not on file    Gets together: Not on file    Attends religious service: Not on file    Active member of club or organization: Not on file    Attends meetings of clubs or organizations: Not on file    Relationship status: Not on file  . Intimate partner violence:    Fear of current or ex partner: Not on file    Emotionally abused:  Not on file    Physically abused: Not on file    Forced sexual activity: Not on file  Other Topics Concern  . Not on file  Social History Narrative  . Not on file    Family History:    Family History  Problem Relation Age of Onset  . Heart attack Mother   . Coronary artery disease Mother   . Hypertension Mother   . Coronary artery disease Father   . Heart attack Father       ROS:  Please see the history of present illness.   All other ROS reviewed and negative.     Physical Exam/Data:   Vitals:   02/18/18 1515 02/18/18 1530 02/18/18 1545 02/18/18 1600  BP: (!) 144/63 (!) 161/63 (!) 169/61 (!) 177/70  Pulse: 74 75 76 84  Resp: 16 15 13 15   Temp:      TempSrc:      SpO2: 100% 100% 100% 100%  Height:        Intake/Output Summary (Last 24 hours) at 02/18/2018 1659 Last data filed at 02/18/2018 1600 Gross per 24 hour  Intake 2683.52 ml  Output 2775 ml  Net -91.48 ml   Last 3 Weights 02/15/2018 01/15/2018 01/12/2018  Weight (lbs) 250 lb 245 lb 245 lb  Weight (kg) 113.399 kg 111.131 kg 111.131 kg     Body mass index is 35.87 kg/m.  General:  Intubated, alert HEENT: normal Lymph: no adenopathy Neck: no JVD Endocrine:  No thryomegaly Vascular: No carotid bruits; FA pulses 2+ bilaterally without bruits  Cardiac:  normal S1, S2; RRR; no murmur  Lungs: intubated Abd: soft, no hepatomegaly  Ext: no edema Musculoskeletal:  Able to move arms and legs. R toe amputation Skin: warm and dry  Neuro:  Unable to assess Psych:  Unable to assess   EKG:  The EKG was personally reviewed and demonstrates:  NSR with diffuse TWI Telemetry:  Telemetry was personally reviewed and demonstrates: NSR without recurrent atrial fibrillation  Relevant CV Studies:  Echo 02/18/2018 LV EF: 60% -   65% Study Conclusions  - Left ventricle: The cavity size was normal. Wall thickness was   normal. Systolic function was normal. The estimated ejection   fraction was in the range of 60% to 65%. Wall motion was normal;   there were no regional wall motion abnormalities. Doppler   parameters are consistent with abnormal left ventricular   relaxation (grade 1 diastolic dysfunction).  Laboratory Data:  Chemistry Recent Labs  Lab 02/17/18 1416 02/18/18 0232  NA 142 143  K 4.0 3.6  CL 105 108  CO2 27 24  GLUCOSE 215* 176*  BUN 46* 42*  CREATININE 3.48* 2.81*    CALCIUM 7.5* 8.0*  GFRNONAA 16* 21*  GFRAA 19* 24*  ANIONGAP 10 11    Recent Labs  Lab 02/17/18 1416  PROT 6.0*  ALBUMIN 3.0*  AST 148*  ALT 104*  ALKPHOS 56  BILITOT 1.0   Hematology Recent Labs  Lab 02/17/18 1416 02/18/18 0232  WBC 10.4 11.0*  RBC 3.66* 3.57*  HGB 10.6* 10.7*  HCT 35.7* 33.7*  MCV 97.5 94.4  MCH 29.0 30.0  MCHC 29.7* 31.8  RDW 15.2 14.9  PLT 163 179   Cardiac Enzymes Recent Labs  Lab 02/18/18 0852 02/18/18 1414  TROPONINI 1.27* 1.49*   No results for input(s): TROPIPOC in the last 168 hours.  BNPNo results for input(s): BNP, PROBNP in the last 168 hours.  DDimer No results for  input(s): DDIMER in the last 168 hours.  Radiology/Studies:  Dg Chest Port 1 View  Result Date: 02/17/2018 CLINICAL DATA:  Evaluate endotracheal tube placement. EXAM: PORTABLE CHEST 1 VIEW COMPARISON:  Earlier today at 0352 hours FINDINGS: 1833 hours. Endotracheal tube terminates 6.3 cm cm above carina. Numerous leads and wires project over the chest. Borderline cardiomegaly. Possible trace left pleural fluid. No pneumothorax. Low lung volumes with new or increased bibasilar atelectasis. IMPRESSION: 1. Appropriate position of endotracheal tube. 2. Low lung volumes with developing bibasilar atelectasis. Possible trace left pleural fluid or thickening. Electronically Signed   By: Abigail Miyamoto M.D.   On: 02/17/2018 19:12    Assessment and Plan:   1. Elevated troponin: In the setting of respiratory distress requiring intubation and altered mental status.  Given normal echocardiogram and lack of chest pain, would recommend treating breathing issues first and may consider stress test later either as outpatient or inpatient.  2. Acute respiratory distress status post intubation: Managed by pulmonary critical care service.  Initially presented to [facility] ED for altered mental status, however had a episode of severe hypotension after morphine, he was given IV fluid and later  intubated for acute respiratory failure.  3. Acute on chronic renal insufficiency: Baseline creatinine 1.3, creatinine on arrival to [facility] was greater than 3.  This may be related to the episode of significant hypotension.  Renal function is slowly improving  4. Paroxysmal atrial fibrillation with RVR: This is new for him, atrial fibrillation with RVR is by report sent from [facility] ED, I do not have EKG to support this.  Apparently his heart rate was 120 while the A. fib in [facility] ED, however at [facility], he has been in sinus rhythm.  This likely occurred in the setting of stress.  Patient was on Coumadin even prior to arrival due to recurrent PE  5. 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 CAD: Although patient is intubated, he is able to follow command and nod or shake his head to question.  He denies any recent chest pain.  He is on Plavix monotherapy at home given the need for Coumadin.  Echocardiogram was normal.  We will hold off on ischemic work-up at this time  7. Insulin-dependent diabetes mellitus  8. COPD  9. Moderate to severe pulmonary hypertension  10. Obstructive sleep apnea  11. Morbid obesity      For questions or updates, please contact [place] Please consult [email or web address] for contact info under     Hilbert Corrigan, [place]  02/18/2018 4:59 PM   Attending Note:   The patient was seen and examined.  Agree with assessment and plan as noted above.  Changes made to the above note as needed.  Patient seen and independently examined with Almyra Deforest, PA .   We discussed all aspects of the encounter. I agree with the assessment and plan as stated above.  1.  Non-ST segment elevation myocardial infarction: Linna Hoff is seen today for evaluation of elevated troponin level and a non-ST segment elevation myocardial infarction.  He has had a complicated postoperative course.  He was hypoxic overnight prior to his admission and  then became worse after receiving IV morphine.  He developed respiratory distress and was intubated.  I suspect that the hypoxemia from the night before and during this respiratory arrest caused stress-induced ischemia/non-ST segment elevation myocardial infarction.  His echocardiogram shows normal left ventricular systolic function without segmental wall motion  abnormalities.  In addition he is developed acute renal failure which is gradual improving.  At this time I do not think that he needs a heart catheterization this time..  I think we need to allow him to heal up and improve which will take time.  He denies having any episodes of angina.  My hope is that he will continue to improve and we can discuss further work-up as an outpatient.       I have spent a total of 40 minutes with patient reviewing hospital  notes , telemetry, EKGs, labs and examining patient as well as establishing an assessment and plan that was discussed with the patient. > 50% of time was spent in direct patient care.    Thayer Headings, Brooke Bonito., MD, [facility] 02/18/2018, 5:39 PM [address]  [place] Pager [phone]

## 2018-02-18 NOTE — Progress Notes (Addendum)
ANTICOAGULATION CONSULT NOTE - Initial Consult  Pharmacy Consult for Heparin Indication: atrial fibrillation  No Known Allergies  Patient Measurements: Heparin Dosing Weight: 97.9kg  Vital Signs: Temp: 97.7 F (36.5 C) (01/19 1152) Temp Source: Oral (01/19 1152) BP: 158/63 (01/19 1200) Pulse Rate: 57 (01/19 1200)  Labs: Recent Labs    02/17/18 1416 02/17/18 1631 02/18/18 0232 02/18/18 0852 02/18/18 1129  HGB 10.6*  --  10.7*  --   --   HCT 35.7*  --  33.7*  --   --   PLT 163  --  179  --   --   APTT >200*  --   --   --   --   LABPROT 15.8*  --   --   --   --   INR 1.28  --   --   --   --   HEPARINUNFRC  --  0.95* 0.79*  --  0.61  CREATININE 3.48*  --  2.81*  --   --   TROPONINI  --   --   --  1.27*  --    Estimated Creatinine Clearance: 28.7 mL/min (A) (by C-G formula based on SCr of 2.81 mg/dL (H)).  Assessment: 28 yoM presenting to [facility] from OSH after a fall with h/o afib on warfarin PTA, recently held for 2nd toe amputation performed 1/16 (INR 1.16), discharged with BID enoxaparin bridge to therapeutic INR. Pharmacy consulted for IV heparin dosing. Heparin level is now within goal range at 0.61. Informed by RN that patient is having significant oral bleeding and was ordered to hold heparin by Dr. Denese Killings AFTER heparin level was drawn. She was given instruction to re-evaluate bleeding in a few hours. Heparin has been off since ~1300. Hgb stable 10.7, pltc WNL.  Goal of Therapy:  Heparin level 0.3-0.7 units/ml Monitor platelets by anticoagulation protocol: Yes   Plan:  RN to contact pharmacy if/when heparin gtt is resumed Plan to resume at previous rate of 1600 units/hr  ADDENDUM: Received call from RN, have not had any more significant bleeding since heparin was stopped. Will resumed IV heparin gtt at 1600 units/hr and recheck heparin level in 8 hours.  Roderic Scarce Zigmund Daniel, PharmD, BCPS PGY2 Infectious Diseases Pharmacy Resident Phone: [phone] 02/18/2018,2:41 PM

## 2018-02-18 NOTE — Progress Notes (Addendum)
Pending echo for pt.  Spoke w/ Echo RN who states will get this done today  Pt w/ bright red blood coming out of subglottic (OG attempted 4x yesterday). Relayed to CCM MD who gave orders to stop heparin gtt and reassess in 2 hours.

## 2018-02-18 NOTE — Progress Notes (Signed)
Pts second troponin still elevated: 1.49.  Relayed to [place], CCM Resident. Heparin level therapeutic @ 0.61. Per CCM Resident, may restart heparin gtt.  Spoke w/ pharmacy to relay. Per Pharmacy, restart heparin gtt @ previous rate of 1600 units

## 2018-02-18 NOTE — Progress Notes (Signed)
ANTICOAGULATION CONSULT NOTE  Pharmacy Consult for Heparin Indication: atrial fibrillation  No Known Allergies  Patient Measurements: Heparin Dosing Weight: 97.9kg  Vital Signs: Temp: 97.8 F (36.6 C) (01/18 2034) Temp Source: Axillary (01/18 2034) BP: 164/62 (01/19 0245) Pulse Rate: 56 (01/19 0325)  Labs: Recent Labs    02/15/18 0749 02/17/18 1416 02/17/18 1631 02/18/18 0232  HGB  --  10.6*  --  10.7*  HCT  --  35.7*  --  33.7*  PLT  --  163  --  179  APTT  --  >200*  --   --   LABPROT 14.7 15.8*  --   --   INR 1.16 1.28  --   --   HEPARINUNFRC  --   --  0.95* 0.79*  CREATININE  --  3.48*  --   --    Estimated Creatinine Clearance: 23.1 mL/min (A) (by C-G formula based on SCr of 3.48 mg/dL (H)).  Assessment: 78 y.o. male with h/o Afib and DVT/PE, subtherapetic INR for heparin  Goal of Therapy:  Heparin level 0.3-0.7 units/ml Monitor platelets by anticoagulation protocol: Yes   Plan:  Decrease heparin gtt to 1600 units/hr Recheck heparin level in 8 hours  Geannie Risen, PharmD, BCPS  02/18/2018,3:40 AM

## 2018-02-18 NOTE — Progress Notes (Signed)
  Echocardiogram 2D Echocardiogram has been performed.  Delcie Roch 02/18/2018, 3:51 PM

## 2018-02-18 NOTE — Progress Notes (Signed)
RT placed patient on BIPAP HS 12/6 and 4L O2 bleed in. Patient is tolerating well at this time.

## 2018-02-18 NOTE — Progress Notes (Addendum)
eLink Physician-Brief Progress Note Patient Name: JAWAAD VALLE DOB: [birth year] MRN: [id number]   Date of Service  02/18/2018  HPI/Events of Note  Pt extubated earlier today and remains NPO.  Pt complaining of pain.    He is also hypertensive with SBP in the 180s.  eICU Interventions  Dilaudid ordered prn.   Increase hydralazine dose.  Advised to give pain medication first and only if SBP remains elevated SBP >160 will the hydralazine be given.     Intervention Category Intermediate Interventions: Pain - evaluation and management;Hypertension - evaluation and management  Larinda Buttery 02/18/2018, 8:05 PM   9:32 PM  Pt went into rapid atrial fibrillation in the 190s while the patient is being bathed.  He is also complaining of pain.  Plan: Give Lopressor 5mg  IV.  Increased dose of Dilaudid to address left hip pain.

## 2018-02-18 NOTE — Progress Notes (Signed)
   02/18/18 1000  Clinical Encounter Type  Visited With Patient not available;Family  Visit Type Initial;Social support;Psychological support;Critical Care;Other (Comment) (AD request)  Referral From Nurse  Spiritual Encounters  Spiritual Needs Emotional  Stress Factors  Patient Stress Factors Not reviewed  Family Stress Factors Exhausted;Other (Comment) (concerned about dad's living arrangement)   Responded to [facility] consult for AD, however pt is not appropriate for legal paperwork at this time.  If pt becomes appropriate and wished to proceed, pls enter new consult.  Empathetic conversation w/ pt's daughter, Velna Hatchet, about her dad's medical journey.  A consult has already been placed previously regarding her concerns about appropriateness of pt's living situation to his medical status.  Let pt's daughter know about food options, encouraged her to get some sunshine and take care of herself.  Margretta Sidle resident, [phone]

## 2018-02-18 NOTE — Progress Notes (Signed)
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/15/2018 status post fall on 02/17/2018 transferred to [facility] 02/17/2018 with hypercarbic hypoxic respiratory failure.Marland Kitchen  History of present illness   78 year old gentleman who is a former smoker and carries diagnosis of chronic obstructive pulmonary disease, poorly controlled diabetes mellitus, order artery disease status post stent in 2010, morbid obesity, congestive heart failure, atrial fibrillation with rapid ventricular response, and just had second toe amputation secondary to poorly controlled diabetes 02/15/2018 at [facility]. He presented to [facility] on a.m. 0 400 02/17/2018 secondary to a fall.  He was noted to be hypoxic.  He is also been a great deal of pain he was given morphine which caused him his blood pressure to fall out.  Was treated with IV fluids which led to increasing respiratory distress and need for noninvasive mechanical ventilatory support. He is transferred to [facility] and found to be hypoxic hypercarbic and with a respiratory acidosis.  He is got a plethora of health issues and may require intubation and further management mechanical ventilatory support.  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 Diabetes mellitus poorly controlled Hypertension Atrial fibrillation with biventricular response DVT with pulmonary embolism Generalized failure to thrive Morbid obesity Coronary artery disease status post stent  Significant Hospital Events   02/17/2018 transferred to [facility] from [facility] after a fall  Consults:    Procedures:    Significant Diagnostic Tests:    Micro Data:  02/17/2018 blood cultures x2>> 02/17/2018  urine culture>>  Antimicrobials:  02/17/2018 vancomycin>> 02/17/2018 Zosyn>>  Interim history/subjective:  Patient was agitated overnight requiring more sedation.  There was some nursing concern this morning about sinus bradycardia and elevated blood pressure.  Unable to pass an OG tube.  Patient denies any chest pain or shortness of breath.  Remained intubated.  Objective   Blood pressure (!) 172/64, pulse (!) 50, temperature 97.8 F (36.6 C), temperature source Axillary, resp. rate 16, height 5\' 10"  (1.778 m), SpO2 100 %.    Vent Mode: PSV;CPAP FiO2 (%):  [40 %-100 %] 40 % Set Rate:  [22 bmp] 22 bmp Vt Set:  [580 mL] 580 mL PEEP:  [5 cmH20] 5 cmH20 Pressure Support:  [12 cmH20] 12 cmH20 Plateau Pressure:  [18 cmH20-21 cmH20] 20 cmH20   Intake/Output Summary (Last 24 hours) at 02/18/2018 0847 Last data filed at 02/18/2018 0800 Gross per 24 hour  Intake 2460.7 ml  Output 1875 ml  Net 585.7 ml   There were no vitals filed for this visit.  Examination: General: Well-developed, obese gentleman, sedated and intubated. HENT:  AT, ETT. Lungs: Clear bilaterally. Cardiovascular: Sinus bradycardia with heart rate in high 40s. Abdomen: Obese, soft, tender, positive bowel sounds. Extremities: Left lower extremity with extensive changes of venous stasis positive edema multiple ulcers. Right lower extremity with clean dressing  Neuro: Sedated, following simple commands and moving all extremities. GU: No Foley at this time.  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  COPD former smoker Obstructive sleep apnea. Noted to have a pH of 7.26 with a PCO2 of 67.  Most likely component of obstructive sleep apnea worsened by narcotic  use for recent foot surgery. ABGs has been improved. -Patient was intubated last night, remained intubated with minimum support, requiring 8/5 during  pressure support.  We will try SBT again tomorrow. -Careful with sedation -Bronchodilators -We will try to wean him off. -DC Vanc and Zosyn. -Lasix 80 mg IV.  Atrial fibrillation with open ventricular response. History of coronary artery disease with stent to the proximal right coronary 2010 History of pulmonary embolism with history of deep vein thrombosis. Currently sinus bradycardia with normal PR interval. -Twelve-lead EKG-shows new onset ST depression in anterolateral leads which were not there on January 16.  Apparently elevated troponin at outside hospital.  Concern for NSTEMI. -Consult cardiology. -Trend troponin. -Repeat echocardiogram. -Currently on heparin infusion. -Avoid nodal blockade at this time.  Hypertension.  Initially softer blood pressure so home meds were held. Currently elevated blood pressure, no OG tube so unable to restart home meds. -IV hydralazine 5 mg every 6 hourly for systolic blood pressure above 161. -We will restart home amlodipine and have some oral access, continue holding lisinopril due to AKI.  AKI.  Most likely secondary to hypoxia. Creatinine improving, currently 2.81 with baseline of 1.2. -Continue monitoring. -Avoid nephrotoxic.  Status post recent fall.  CT performed at [facility] reveals no acute injury. -Monitor mental status   Diabetes mellitus.  A1c in July 2019 was 6.6. CBG between 151-200 -Sliding scale insulin. 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  Performed with Dr. Victorino Dike with assistance from [facility] -Ortho is following-appreciate their recommendations. -Daily dressing change per Ortho. -No need for antibiotics for this amputation.  Chronic renal insufficiency Lab Results  Component Value Date   CREATININE 2.81 (H) 02/18/2018   CREATININE 3.48 (H) 02/17/2018   CREATININE 1.30 (H)  01/15/2018   --Avoid nephrotoxins -Monitor creatinine   History of depression -Antidepressants as needed  Best practice:  Diet: N.p.o. Pain/Anxiety/Delirium protocol (if indicated): Narcotics as needed for surgical pain VAP protocol (if indicated): yes DVT prophylaxis: Heparin GI prophylaxis: Proton pump inhibitor Glucose control: Sliding scale insulin Mobility: Bedrest Code Status: Full code Family Communication: 02/18/2018 .  Daughter updated at bedside. Disposition: ICU  Labs   CBC: Recent Labs  Lab 02/17/18 1416 02/18/18 0232  WBC 10.4 11.0*  HGB 10.6* 10.7*  HCT 35.7* 33.7*  MCV 97.5 94.4  PLT 163 179    Basic Metabolic Panel: Recent Labs  Lab 02/17/18 1416 02/18/18 0232  NA 142 143  K 4.0 3.6  CL 105 108  CO2 27 24  GLUCOSE 215* 176*  BUN 46* 42*  CREATININE 3.48* 2.81*  CALCIUM 7.5* 8.0*  MG 2.5*  --   PHOS 5.0*  --    GFR: Estimated Creatinine Clearance: 28.7 mL/min (A) (by C-G formula based on SCr of 2.81 mg/dL (H)). Recent Labs  Lab 02/17/18 1416 02/17/18 1816 02/18/18 0232  PROCALCITON 0.21  --   --   WBC 10.4  --  11.0*  LATICACIDVEN 0.9 1.4  --     Liver Function Tests: Recent Labs  Lab 02/17/18 1416  AST 148*  ALT 104*  ALKPHOS 56  BILITOT 1.0  PROT 6.0*  ALBUMIN 3.0*   Recent Labs  Lab 02/17/18 1416  LIPASE 20  AMYLASE 13*   No results for input(s): AMMONIA in the last 168 hours.  ABG    Component Value Date/Time   PHART 7.465 (H)  02/17/2018 2007   PCO2ART 38.0 02/17/2018 2007   PO2ART 400.0 (H) 02/17/2018 2007   HCO3 27.4 02/17/2018 2007   TCO2 29 02/17/2018 2007   O2SAT 100.0 02/17/2018 2007     Coagulation Profile: Recent Labs  Lab 02/15/18 0749 02/17/18 1416  INR 1.16 1.28    Cardiac Enzymes: No results for input(s): CKTOTAL, CKMB, CKMBINDEX, TROPONINI in the last 168 hours.  HbA1C: Hgb A1c MFr Bld  Date/Time Value Ref Range Status  08/09/2017 02:02 PM 6.6 (H) 4.8 - 5.6 % Final    Comment:              Prediabetes: 5.7 - 6.4          Diabetes: >6.4          Glycemic control for adults with diabetes: <7.0     CBG: Recent Labs  Lab 02/17/18 1947 02/17/18 2133 02/17/18 2337 02/18/18 0422 02/18/18 0810  GLUCAP 197* 187* 202* 151* 162*    Review of Systems:   Not available secondary to confusion.  Past Medical History  He,  has a past medical history of Arthritis, Chronic renal insufficiency, CKD (chronic kidney disease), stage III (HCC), COPD (chronic obstructive pulmonary disease) (HCC), Coronary disease (October 2010), Depression, Diabetes mellitus, Diastolic dysfunction, Dyspnea, Gout, History of blood transfusion, History of diabetic ulcer of foot, Hypertension, Hypoventilation, Obesity, Pneumonia, Pulmonary embolism [facility]) (March 2010), Pulmonary hypertension [facility]), Retroperitoneal bleed, Sleep apnea, and Wears dentures.   Surgical History    Past Surgical History:  Procedure Laterality Date  . ABDOMINAL AORTOGRAM W/LOWER EXTREMITY Right 01/15/2018   Procedure: ABDOMINAL AORTOGRAM W/LOWER EXTREMITY;  Surgeon: Maeola Harman, MD;  Location: [facility] INVASIVE CV LAB;  Service: Cardiovascular;  Laterality: Right;  . AMPUTATION TOE Left 05/04/2017   Procedure: Left hallux amputation;  Surgeon: Toni Arthurs, MD;  Location: [facility];  Service: Orthopedics;  Laterality: Left;  . AMPUTATION TOE Right 12/21/2017   Procedure: Right hallux amputation;  Surgeon: Toni Arthurs, MD;  Location: [facility];  Service: Orthopedics;  Laterality: Right;   . AMPUTATION TOE Right 02/15/2018   Procedure: Right 2nd toe amputation;  Surgeon: Toni Arthurs, MD;  Location: [facility];  Service: Orthopedics;  Laterality: Right;  62  . BACK SURGERY  2000   post  . CARDIOVASCULAR STRESS TEST  11-14-2008   EF 44%  . EXPLORATORY LAPAROTOMY  1972   POST  . LOWER EXTREMITY ANGIOGRAPHY N/A 05/01/2017   Procedure: LOWER EXTREMITY ANGIOGRAPHY;  Surgeon: Maeola Harman, MD;  Location: [facility] INVASIVE CV LAB;  Service: Cardiovascular;  Laterality: N/A;  . LOWER EXTREMITY ANGIOGRAPHY N/A 05/15/2017   Procedure: LOWER EXTREMITY ANGIOGRAPHY;  Surgeon: Maeola Harman, MD;  Location: [facility] INVASIVE CV LAB;  Service: Cardiovascular;  Laterality: N/A;  . MULTIPLE TOOTH EXTRACTIONS    . PERIPHERAL VASCULAR ATHERECTOMY  05/01/2017   Procedure: PERIPHERAL VASCULAR ATHERECTOMY;  Surgeon: Maeola Harman, MD;  Location: [facility] INVASIVE CV LAB;  Service: Cardiovascular;;  Left SFA  . PERIPHERAL VASCULAR BALLOON ANGIOPLASTY  05/01/2017   Procedure: PERIPHERAL VASCULAR BALLOON ANGIOPLASTY;  Surgeon: Maeola Harman, MD;  Location: [facility] INVASIVE CV LAB;  Service: Cardiovascular;;  Left Anterial Tibial Artery  . PERIPHERAL VASCULAR BALLOON ANGIOPLASTY Right 01/15/2018   Procedure: PERIPHERAL VASCULAR BALLOON ANGIOPLASTY;  Surgeon: Maeola Harman, MD;  Location: [facility] INVASIVE CV LAB;  Service: Cardiovascular;  Laterality: Right;  SFA atherectomy  . PERIPHERAL VASCULAR INTERVENTION Right 05/15/2017   Procedure: PERIPHERAL VASCULAR INTERVENTION;  Surgeon: Maeola Harman, MD;  Location: [facility] INVASIVE CV LAB;  Service: Cardiovascular;  Laterality: Right;  SFA Stent  . US ECHOCARDIOGRAPHY  11-25-2008   EF 55-60%     Social History   reports that he quit smoking about 38 years ago. He quit after 2.00 years of use. He has never used smokeless tobacco. He reports previous alcohol use. He reports that he does not use drugs.   Family History   His family history includes Coronary artery disease in his father and mother; Heart attack in his father and mother; Hypertension in his mother.   Allergies No Known Allergies   Home Medications  Prior to Admission medications   Medication Sig Start Date End Date Taking? Authorizing Provider  albuterol (PROVENTIL) (2.5 MG/3ML) 0.083% nebulizer solution Take 2.5 mg by nebulization every 4 (four) hours  as needed for wheezing or shortness of breath.  12/05/16   [provider]  allopurinol (ZYLOPRIM) 300 MG tablet Take 300 mg by mouth daily.  02/21/15   [provider]  amLODipine (NORVASC) 5 MG tablet Take 1 tablet (5 mg total) by mouth daily. 08/09/17 08/04/18  [place], Peter M, MD  atorvastatin (LIPITOR) 40 MG tablet Take 1 tablet (40 mg total) by mouth daily. 01/07/13   [place], Peter M, MD  busPIRone (BUSPAR) 15 MG tablet Take 15 mg by mouth 2 (two) times daily.  12/04/12   [provider]  cephALEXin (KEFLEX) 500 MG capsule Take 500 mg by mouth 4 (four) times daily.    [provider]  ciclopirox (PENLAC) 8 % solution Apply 1 application topically at bedtime. Apply over nail and surrounding skin. Apply daily over previous coat. After seven (7) days, may remove with alcohol and continue cycle.    [provider]  clopidogrel (PLAVIX) 75 MG tablet TAKE ONE TABLET BY MOUTH EVERY MORNING 04/17/17   [place], Peter M, MD  doxazosin (CARDURA) 4 MG tablet Take 4 mg by mouth every evening.     [provider]  DULoxetine (CYMBALTA) 60 MG capsule Take 60 mg by mouth daily.      [provider]  enoxaparin (LOVENOX) 120 MG/0.8ML injection Inject 0.8 mLs (120 mg total) into the skin every 12 (twelve) hours for 3 days. Patient taking differently: Inject 120 mg into the skin every 12 (twelve) hours.  01/12/18 02/12/18  Maeola Harman, MD  FLOVENT HFA 44 MCG/ACT inhaler Inhale 3 puffs into the lungs 2 (two) times daily.  05/09/11   [provider]  furosemide (LASIX) 20 MG tablet Take 40 mg by mouth daily.  02/21/15   [provider]  gabapentin (NEURONTIN) 300 MG capsule Take 600 mg by mouth 2 (two) times daily.    [provider]  glucagon 1 MG injection Inject 1 mg into the vein once as needed (for low blood sugars.).     [provider]  Insulin Glargine (BASAGLAR KWIKPEN) 100 UNIT/ML SOPN Inject 40 Units into  the skin daily.    [provider]  ipratropium (ATROVENT) 0.02 % nebulizer solution Take 0.5 mg by nebulization every 4 (four) hours as needed for wheezing or shortness of breath.     [provider]  lisinopril (PRINIVIL,ZESTRIL) 10 MG tablet Take 10 mg by mouth daily. 04/19/17   [provider]  Magnesium Oxide 400 (240 Mg) MG TABS Take 400 mg by mouth 2 (two) times daily. 04/19/17   [provider]  metoprolol succinate (TOPROL-XL) 100 MG 24 hr  tablet Take 150 mg by mouth daily.  10/18/12   [provider]  Multiple Vitamins-Minerals (CENTRUM SILVER ULTRA MENS PO) Take 1 tablet by mouth daily. 04/19/17   [provider]  naloxone [facility](NARCAN) nasal spray 4 mg/0.1 mL Place 1 spray into the nose as needed (opioid overdose).     [provider]  nitroGLYCERIN (NITROSTAT) 0.4 MG SL tablet Place 0.4 mg under the tongue every 5 (five) minutes as needed for chest pain.     [provider]  NOVOLOG FLEXPEN 100 UNIT/ML FlexPen Inject 8-12 Units into the skin 2 (two) times daily with a meal.  12/11/17   [provider]  Omega-3 Fatty Acids (FISH OIL) 1000 MG CAPS Take 1,000 mg by mouth at bedtime.    [provider]  oxyCODONE (ROXICODONE) 15 MG immediate release tablet Take 15 mg by mouth 4 (four) times daily as needed for pain.     [provider]  oxyCODONE ER (XTAMPZA ER) 36 MG C12A Take 36 mg by mouth every 12 (twelve) hours.    [provider]  silver sulfADIAZINE (SILVADENE) 1 % cream Apply 1 application topically daily as needed (wound care). apply to toes    [provider]  warfarin (COUMADIN) 2.5 MG tablet Take 2.5 mg by mouth every evening.  02/21/15   [provider]     Critical care time: 45 min app    Arnetha CourserSumayya Lewis Keats MD. PGY3 Pager [phone] If no answer page [phone] 02/18/2018, 8:47 AM

## 2018-02-19 ENCOUNTER — Inpatient Hospital Stay (HOSPITAL_COMMUNITY): Payer: Medicare Other

## 2018-02-19 DIAGNOSIS — N179 Acute kidney failure, unspecified: Secondary | ICD-10-CM

## 2018-02-19 DIAGNOSIS — I483 Typical atrial flutter: Secondary | ICD-10-CM

## 2018-02-19 DIAGNOSIS — I248 Other forms of acute ischemic heart disease: Secondary | ICD-10-CM

## 2018-02-19 LAB — RENAL FUNCTION PANEL
Albumin: 3 g/dL — ABNORMAL LOW (ref 3.5–5.0)
Anion gap: 17 — ABNORMAL HIGH (ref 5–15)
BUN: 22 mg/dL (ref 8–23)
CO2: 24 mmol/L (ref 22–32)
Calcium: 8.2 mg/dL — ABNORMAL LOW (ref 8.9–10.3)
Chloride: 101 mmol/L (ref 98–111)
Creatinine, Ser: 1.82 mg/dL — ABNORMAL HIGH (ref 0.61–1.24)
GFR calc Af Amer: 41 mL/min — ABNORMAL LOW (ref >60)
GFR calc non Af Amer: 36 mL/min — ABNORMAL LOW (ref >60)
Glucose, Bld: 218 mg/dL — ABNORMAL HIGH (ref 70–99)
Phosphorus: 3.1 mg/dL (ref 2.5–4.6)
Potassium: 3.5 mmol/L (ref 3.5–5.1)
Sodium: 142 mmol/L (ref 135–145)

## 2018-02-19 LAB — GLUCOSE, CAPILLARY
Glucose-Capillary: 215 mg/dL — ABNORMAL HIGH (ref 70–99)
Glucose-Capillary: 196 mg/dL — ABNORMAL HIGH (ref 70–99)
Glucose-Capillary: 268 mg/dL — ABNORMAL HIGH (ref 70–99)
Glucose-Capillary: 267 mg/dL — ABNORMAL HIGH (ref 70–99)
Glucose-Capillary: 199 mg/dL — ABNORMAL HIGH (ref 70–99)
Glucose-Capillary: 234 mg/dL — ABNORMAL HIGH (ref 70–99)

## 2018-02-19 LAB — CBC
HCT: 35.5 % — ABNORMAL LOW (ref 39.0–52.0)
Hemoglobin: 11.3 g/dL — ABNORMAL LOW (ref 13.0–17.0)
MCH: 29.9 pg (ref 26.0–34.0)
MCHC: 31.8 g/dL (ref 30.0–36.0)
MCV: 93.9 fL (ref 80.0–100.0)
Platelets: 209 10*3/uL (ref 150–400)
RBC: 3.78 MIL/uL — ABNORMAL LOW (ref 4.22–5.81)
RDW: 15.2 % (ref 11.5–15.5)
WBC: 15.2 10*3/uL — ABNORMAL HIGH (ref 4.0–10.5)
nRBC: 0 % (ref 0.0–0.2)

## 2018-02-19 LAB — HEPARIN LEVEL (UNFRACTIONATED): Heparin Unfractionated: 0.41 IU/mL (ref 0.30–0.70)

## 2018-02-19 MED ORDER — CLOPIDOGREL BISULFATE 75 MG PO TABS
75.0000 mg | ORAL_TABLET | Freq: Every morning | ORAL | Status: DC
  Administered 2018-02-19 – 2018-02-27 (×9): 75 mg via ORAL
  Filled 2018-02-19 (×9): qty 1

## 2018-02-19 MED ORDER — HYDROMORPHONE HCL 1 MG/ML IJ SOLN
1.0000 mg | INTRAMUSCULAR | Status: DC | PRN
  Administered 2018-02-19 – 2018-02-21 (×7): 1 mg via INTRAVENOUS
  Filled 2018-02-19 (×7): qty 1

## 2018-02-19 MED ORDER — OXYCODONE HCL ER 10 MG PO T12A
40.0000 mg | EXTENDED_RELEASE_TABLET | Freq: Two times a day (BID) | ORAL | Status: DC
  Administered 2018-02-19 – 2018-02-22 (×8): 40 mg via ORAL
  Filled 2018-02-19 (×4): qty 2
  Filled 2018-02-19: qty 4
  Filled 2018-02-19: qty 2
  Filled 2018-02-19: qty 4
  Filled 2018-02-19: qty 2

## 2018-02-19 MED ORDER — INSULIN GLARGINE 100 UNIT/ML ~~LOC~~ SOLN
20.0000 [IU] | Freq: Every day | SUBCUTANEOUS | Status: DC
  Administered 2018-02-19 – 2018-02-20 (×2): 20 [IU] via SUBCUTANEOUS
  Filled 2018-02-19 (×2): qty 0.2

## 2018-02-19 MED ORDER — AMLODIPINE BESYLATE 10 MG PO TABS
10.0000 mg | ORAL_TABLET | Freq: Every day | ORAL | Status: DC
  Administered 2018-02-19 – 2018-02-27 (×9): 10 mg via ORAL
  Filled 2018-02-19 (×9): qty 1

## 2018-02-19 MED ORDER — WARFARIN SODIUM 2.5 MG PO TABS
2.5000 mg | ORAL_TABLET | Freq: Once | ORAL | Status: AC
  Administered 2018-02-19: 2.5 mg via ORAL
  Filled 2018-02-19 (×2): qty 1

## 2018-02-19 MED ORDER — ATORVASTATIN CALCIUM 40 MG PO TABS
40.0000 mg | ORAL_TABLET | Freq: Every day | ORAL | Status: DC
  Administered 2018-02-19 – 2018-02-27 (×9): 40 mg via ORAL
  Filled 2018-02-19 (×9): qty 1

## 2018-02-19 MED ORDER — WHITE PETROLATUM EX OINT
TOPICAL_OINTMENT | CUTANEOUS | Status: AC
  Administered 2018-02-19: 0.2
  Filled 2018-02-19: qty 28.35

## 2018-02-19 MED ORDER — FUROSEMIDE 10 MG/ML IJ SOLN
40.0000 mg | Freq: Once | INTRAMUSCULAR | Status: AC
  Administered 2018-02-19: 40 mg via INTRAVENOUS
  Filled 2018-02-19: qty 4

## 2018-02-19 MED ORDER — DOXAZOSIN MESYLATE 4 MG PO TABS
4.0000 mg | ORAL_TABLET | Freq: Every evening | ORAL | Status: DC
  Administered 2018-02-20 – 2018-02-27 (×8): 4 mg via ORAL
  Filled 2018-02-19 (×9): qty 1

## 2018-02-19 MED ORDER — METOPROLOL SUCCINATE ER 50 MG PO TB24
150.0000 mg | ORAL_TABLET | Freq: Every day | ORAL | Status: DC
  Administered 2018-02-19 – 2018-02-27 (×9): 150 mg via ORAL
  Filled 2018-02-19 (×9): qty 1

## 2018-02-19 MED ORDER — BUSPIRONE HCL 15 MG PO TABS
15.0000 mg | ORAL_TABLET | Freq: Two times a day (BID) | ORAL | Status: DC
  Administered 2018-02-19 – 2018-02-24 (×11): 15 mg via ORAL
  Filled 2018-02-19 (×11): qty 1

## 2018-02-19 MED ORDER — ENOXAPARIN SODIUM 120 MG/0.8ML ~~LOC~~ SOLN
1.0000 mg/kg | Freq: Two times a day (BID) | SUBCUTANEOUS | Status: DC
  Administered 2018-02-19 – 2018-02-20 (×2): 115 mg via SUBCUTANEOUS
  Filled 2018-02-19 (×4): qty 0.76

## 2018-02-19 MED ORDER — LABETALOL HCL 5 MG/ML IV SOLN
10.0000 mg | INTRAVENOUS | Status: DC | PRN
  Administered 2018-02-19 – 2018-02-20 (×3): 10 mg via INTRAVENOUS
  Filled 2018-02-19 (×3): qty 4

## 2018-02-19 MED ORDER — WARFARIN - PHARMACIST DOSING INPATIENT
Freq: Every day | Status: DC
  Administered 2018-02-24 – 2018-02-27 (×4)

## 2018-02-19 NOTE — Evaluation (Signed)
Occupational Therapy Evaluation Patient Details Name: Paul Chen MRN: [id number] DOB: 12/24/1940 Today's Date: 02/19/2018    History of Present Illness 78 yo male s/p R great toe amputation ( WBAT in postop shoe) 2 days prior to presentation to hospital for respiratory failure on 1/18; Extubated 1/19;  Extreme difficulty getting comfortable, limited by pain form long-standing L hip arthritis. PMH including Arthritis, Chronic renal insufficiency, CKD stage III, COPD, Coronary disease (October 2010), Depression, Diabetes mellitus, Diastolic dysfunction, Dyspnea, Gout, History of blood transfusion, History of diabetic ulcer of foot, HTN, Hypoventilation, Obesity, Pneumonia, Pulmonary embolism (March 2010), and Sleep apnea.   Clinical Impression   PTA, pt was living with his sister and independent with ADLs and IADLs and used a cane for functional mobility due to left hip pain. Pt currently requiring Min A for UB ADLs, Max A for LB ADLs, and Mod A +2 for functional transfers with RW. Pt presenting with decreased strength, balance, and activity tolerance as seen by drops in SpO2 and left hip pain. Pt would benefit from further acute OT to facilitate safe dc. Recommend dc to SNF for further OT to optimize safety, independence with ADLs, and return to PLOF.      Follow Up Recommendations  SNF;Supervision/Assistance - 24 hour    Equipment Recommendations  Other (comment)(Defer to next venue)    Recommendations for Other Services PT consult     Precautions / Restrictions Precautions Precautions: Fall Required Braces or Orthoses: Other Brace Other Brace: Post op shoe; WBAT in postop shoe per Ortho Consult note Restrictions Weight Bearing Restrictions: Yes RLE Weight Bearing: Weight bearing as tolerated      Mobility Bed Mobility Overal bed mobility: Needs Assistance Bed Mobility: Supine to Sit     Supine to sit: Max assist;HOB elevated     General bed mobility comments: Requiring  Max A for managing BLEs towards EOB and elevating trunk. Decreased tolerance due to pain at left hip  Transfers Overall transfer level: Needs assistance Equipment used: Rolling walker (2 wheeled) Transfers: Sit to/from [employer] Sit to Stand: Mod assist;+2 physical assistance;+2 safety/equipment Stand pivot transfers: Mod assist;+2 physical assistance       General transfer comment: Pt requiring Mod A +2 for powering up into standing from EOB. Cues for hand placement instead of leaning heavily on the walker. Pt requiring Mod A +2 for safe pivot to recliner and cues for RW management. Mod A for second stand from recliner.     Balance Overall balance assessment: Needs assistance Sitting-balance support: Bilateral upper extremity supported;Feet supported Sitting balance-Leahy Scale: Poor     Standing balance support: Bilateral upper extremity supported;During functional activity Standing balance-Leahy Scale: Poor Standing balance comment: Reliant on UE support                           ADL either performed or assessed with clinical judgement   ADL Overall ADL's : Needs assistance/impaired Eating/Feeding: Supervision/ safety;Set up;Sitting   Grooming: Min guard;Sitting   Upper Body Bathing: Minimal assistance;Sitting   Lower Body Bathing: Moderate assistance;Sit to/from stand   Upper Body Dressing : Minimal assistance;Sitting   Lower Body Dressing: Maximal assistance;Sit to/from stand Lower Body Dressing Details (indicate cue type and reason): Max A to don post-op shoe Toilet Transfer: Moderate assistance;+2 for physical assistance;RW;Stand-pivot Toilet Transfer Details (indicate cue type and reason): Mod A +2 for pivot to recliner with use of RW.  Functional mobility during ADLs: Moderate assistance;+2 for physical assistance;Rolling walker(stand pivot only) General ADL Comments: Pt presenting with decreased strength, poor balance, and  decreased activity tolerance as seen by significant pain at left hip and poor SpO2     Vision         Perception     Praxis      Pertinent Vitals/Pain Pain Assessment: 0-10 Pain Score: 9  Pain Location: L hip Pain Descriptors / Indicators: Aching;Grimacing;Moaning;Constant Pain Intervention(s): Monitored during session;Limited activity within patient's tolerance;Repositioned;Premedicated before session;Patient requesting pain meds-RN notified     Hand Dominance     Extremity/Trunk Assessment Upper Extremity Assessment Upper Extremity Assessment: Generalized weakness   Lower Extremity Assessment Lower Extremity Assessment: Defer to PT evaluation RLE Deficits / Details: R foot wound dressed in kerlix; he did tolerate bearing weight on foot LLE Deficits / Details: Severe hip OA pain, limiting weight bearing and ROM; unable to find a position of comfort during session LLE: Unable to fully assess due to pain   Cervical / Trunk Assessment Cervical / Trunk Assessment: Other exceptions Cervical / Trunk Exceptions: Increased body habitus   Communication Communication Communication: No difficulties   Cognition Arousal/Alertness: Awake/alert Behavior During Therapy: WFL for tasks assessed/performed Overall Cognitive Status: Within Functional Limits for tasks assessed(for simple mobility)                                 General Comments: Tending to be quite internally distracted by pain   General Comments  Portion of session conducted on Room Air and O2 sats decr to 83%; restarted supplemental O2 and notified RN. Daughter present throughout session    Exercises     Shoulder Instructions      Home Living Family/patient expects to be discharged to:: Private residence Living Arrangements: Other relatives Available Help at Discharge: Family;Available 24 hours/day Type of Home: House Home Access: Stairs to enter [employer]trance Stairs-Number of Steps: 2 Entrance  Stairs-Rails: Right;Left Home Layout: Two level;Bed/bath upstairs Alternate Level Stairs-Number of Steps: Flight             Home Equipment: None          Prior Functioning/Environment Level of Independence: Independent with assistive device(s)        Comments: Uses cane predominantly iwth amb        OT Problem List: Decreased strength;Decreased range of motion;Decreased activity tolerance;Impaired balance (sitting and/or standing);Decreased knowledge of use of DME or AE;Decreased knowledge of precautions;Pain      OT Treatment/Interventions: Self-care/ADL training;Therapeutic exercise;Energy conservation;DME and/or AE instruction;Therapeutic activities;Patient/family education    OT Goals(Current goals can be found in the care plan section) Acute Rehab OT Goals Patient Stated Goal: "Stop this pain" OT Goal Formulation: With patient Time For Goal Achievement: 03/05/18 Potential to Achieve Goals: Good ADL Goals Pt Will Perform Lower Body Dressing: with min assist;sit to/from stand;with caregiver independent in assisting;with adaptive equipment Pt Will Transfer to Toilet: with min assist;ambulating;bedside commode Pt Will Perform Toileting - Clothing Manipulation and hygiene: with min assist;sit to/from stand Additional ADL Goal #1: Pt will perform bed mobility with supervision in preparation for ADLs  OT Frequency: Min 2X/week   Barriers to D/C:            Co-evaluation PT/OT/SLP Co-Evaluation/Treatment: Yes Reason for Co-Treatment: Complexity of the patient's impairments (multi-system involvement);For patient/therapist safety;To address functional/ADL transfers   OT goals addressed during session: ADL's and self-care  AM-PAC OT "6 Clicks" Daily Activity     Outcome Measure Help from another person eating meals?: None Help from another person taking care of personal grooming?: A Little Help from another person toileting, which includes using toliet, bedpan,  or urinal?: A Lot Help from another person bathing (including washing, rinsing, drying)?: A Lot Help from another person to put on and taking off regular upper body clothing?: A Little Help from another person to put on and taking off regular lower body clothing?: A Lot 6 Click Score: 16   End of Session Equipment Utilized During Treatment: Gait belt;Rolling walker;Oxygen;Other (comment)(post-op shoe) Nurse Communication: Mobility status;Patient requests pain meds;Weight bearing status  Activity Tolerance: Patient tolerated treatment well;Patient limited by pain Patient left: in chair;with call bell/phone within reach;with chair alarm set;with nursing/sitter in room;with family/visitor present  OT Visit Diagnosis: Unsteadiness on feet (R26.81);Other abnormalities of gait and mobility (R26.89);Muscle weakness (generalized) (M62.81);Other symptoms and signs involving cognitive function;Pain Pain - Right/Left: Left Pain - part of body: Hip                Time: [id number] OT Time Calculation (min): 27 min Charges:  OT General Charges $OT Visit: 1 Visit OT Evaluation $OT Eval Moderate Complexity: 1 Mod  Kishaun Erekson MSOT, OTR/L Acute Rehab Pager: [phone] Office: [phone]  Theodoro GristCharis M Kit Mollett 02/19/2018, 11:12 AM

## 2018-02-19 NOTE — Progress Notes (Signed)
Physical Therapy Treatment Patient Details Name: Paul Chen MRN: [id number] DOB: 14-Apr-1940 Today's Date: 02/19/2018    History of Present Illness 78 yo male s/p R great toe amputation ( WBAT in postop shoe) 2 days prior to presentation to hospital for respiratory failure on 1/18; Extubated 1/19;  Extreme difficulty getting comfortable, limited by pain form long-standing L hip arthritis. PMH including Arthritis, Chronic renal insufficiency, CKD stage III, COPD, Coronary disease (October 2010), Depression, Diabetes mellitus, Diastolic dysfunction, Dyspnea, Gout, History of blood transfusion, History of diabetic ulcer of foot, HTN, Hypoventilation, Obesity, Pneumonia, Pulmonary embolism (March 2010), and Sleep apnea.    PT Comments    Seen for second session as Mr. Burkins could not find a tolerable position in the recliner, and RN requested assistance to help him back to bed; Requires heavy mod assist for transfers, but was able to take a few steps with good use of UE support on RW to unweigh painful L hip in standing; Quite a challenge to find a position of comfort for this patient   Follow Up Recommendations  SNF     Equipment Recommendations  Other (comment)(to be determined)    Recommendations for Other Services       Precautions / Restrictions Precautions Precautions: Fall Required Braces or Orthoses: Other Brace Other Brace: Post op shoe; WBAT in postop shoe per Ortho Consult note Restrictions Weight Bearing Restrictions: Yes RLE Weight Bearing: Weight bearing as tolerated    Mobility  Bed Mobility Overal bed mobility: Needs Assistance Bed Mobility: Sit to Supine     Supine to sit: Max assist;HOB elevated Sit to supine: +2 for physical assistance;Max assist   General bed mobility comments: Max assist to help LEs into bed  Transfers Overall transfer level: Needs assistance Equipment used: Rolling walker (2 wheeled) Transfers: Sit to/from Stand Sit to Stand: Mod  assist;+2 physical assistance;+2 safety/equipment Stand pivot transfers: Mod assist;+2 physical assistance       General transfer comment: Mod assist to power up from recliner; cues for hand placement and safety  Ambulation/Gait Ambulation/Gait assistance: Mod assist;+2 safety/equipment(lines/leads) Gait Distance (Feet): (Pivotal steps recliner to bed) Assistive device: Rolling walker (2 wheeled) Gait Pattern/deviations: Shuffle     General Gait Details: Cues to bear down into RW to unweigh painful L hip in standing/upright; VERY painful L hip, not much pain R foot;; Good use of RW   Stairs             Wheelchair Mobility    Modified Rankin (Stroke Patients Only)       Balance Overall balance assessment: Needs assistance Sitting-balance support: Bilateral upper extremity supported;Feet supported Sitting balance-Leahy Scale: Poor     Standing balance support: Bilateral upper extremity supported;During functional activity Standing balance-Leahy Scale: Poor Standing balance comment: Reliant on UE support                            Cognition Arousal/Alertness: Awake/alert Behavior During Therapy: WFL for tasks assessed/performed Overall Cognitive Status: Within Functional Limits for tasks assessed(for simple mobility)                                 General Comments: Tending to be quite internally distracted by pain      Exercises      General Comments General comments (skin integrity, edema, etc.): RN and daughter present throughout session  Pertinent Vitals/Pain Pain Assessment: Faces Pain Score: 9  Pain Location: L hip Pain Descriptors / Indicators: Aching;Grimacing;Moaning;Constant Pain Intervention(s): RN gave pain meds during session    Home Living Family/patient expects to be discharged to:: Private residence Living Arrangements: Other relatives Available Help at Discharge: Family;Available 24 hours/day Type of  Home: House Home Access: Stairs to enter Entrance Stairs-Rails: Right;Left Home Layout: Two level;Bed/bath upstairs Home Equipment: None      Prior Function Level of Independence: Independent with assistive device(s)      Comments: Uses cane predominantly iwth amb   PT Goals (current goals can now be found in the care plan section) Acute Rehab PT Goals Patient Stated Goal: "Stop this pain" PT Goal Formulation: Patient unable to participate in goal setting Time For Goal Achievement: 03/05/18 Potential to Achieve Goals: Fair Progress towards PT goals: Progressing toward goals    Frequency    Min 2X/week      PT Plan Current plan remains appropriate    Co-evaluation   Reason for Co-Treatment: Complexity of the patient's impairments (multi-system involvement);For patient/therapist safety;To address functional/ADL transfers   OT goals addressed during session: ADL's and self-care      AM-PAC PT "6 Clicks" Mobility   Outcome Measure  Help needed turning from your back to your side while in a flat bed without using bedrails?: A Lot Help needed moving from lying on your back to sitting on the side of a flat bed without using bedrails?: A Lot Help needed moving to and from a bed to a chair (including a wheelchair)?: A Lot Help needed standing up from a chair using your arms (e.g., wheelchair or bedside chair)?: A Lot Help needed to walk in hospital room?: A Lot Help needed climbing 3-5 steps with a railing? : Total 6 Click Score: 11    End of Session Equipment Utilized During Treatment: Oxygen Activity Tolerance: Patient limited by pain Patient left: in bed;with call bell/phone within reach;with nursing/sitter in room;with family/visitor present Nurse Communication: Mobility status PT Visit Diagnosis: Other abnormalities of gait and mobility (R26.89);Pain Pain - Right/Left: Left Pain - part of body: Hip     Time: [id number] PT Time Calculation (min) (ACUTE ONLY): 10  min  Charges:  $Therapeutic Activity: 8-22 mins                     Van ClinesHolly Monica Codd, PT  Acute Rehabilitation Services Pager [phone] Office [phone]    Levi AlandHolly H Algie Westry 02/19/2018, 12:36 PM

## 2018-02-19 NOTE — Progress Notes (Addendum)
Progress Note  Patient Name: Paul Chen Date of Encounter: 02/19/2018  Primary Cardiologist: Von Inscoe [place], MD   Subjective   Continues to have significant left hip pain. Denies chest pain or SOB.   Inpatient Medications    Scheduled Meds: . allopurinol  300 mg Oral Daily  . amLODipine  10 mg Oral Daily  . DULoxetine  60 mg Oral Daily  . gabapentin  600 mg Oral BID  . insulin aspart  0-9 Units Subcutaneous Q4H  . mouth rinse  15 mL Mouth Rinse BID  . oxyCODONE  40 mg Oral Q12H  . pantoprazole (PROTONIX) IV  40 mg Intravenous QHS   Continuous Infusions: . sodium chloride 10 mL/hr at 02/19/18 0600  . dexmedetomidine (PRECEDEX) IV infusion Stopped (02/18/18 1005)  . heparin 1,600 Units/hr (02/19/18 0600)  . phenylephrine (NEO-SYNEPHRINE) Adult infusion     PRN Meds: hydrALAZINE, HYDROmorphone (DILAUDID) injection, labetalol   Vital Signs    Vitals:   02/19/18 0430 02/19/18 0530 02/19/18 0600 02/19/18 0731  BP: (!) 168/54 (!) 168/55 (!) 161/54   Pulse: 80 78 78   Resp: 17 (!) 24 18   Temp:    98.5 F (36.9 C)  TempSrc:    Axillary  SpO2: 99% 100% 99%   Height:        Intake/Output Summary (Last 24 hours) at 02/19/2018 0919 Last data filed at 02/19/2018 0600 Gross per 24 hour  Intake 766.26 ml  Output 5410 ml  Net -4643.74 ml   There were no vitals filed for this visit.  Physical Exam   General: Well developed, well nourished, NAD Skin: Warm, dry, intact  Head: Normocephalic, atraumatic, clear, moist mucus membranes. Neck: Negative for carotid bruits. No JVD Lungs:Clear to ausculation bilaterally. No wheezes, rales, or rhonchi. Breathing is unlabored. Cardiovascular: RRR with S1 S2. No murmurs, rubs, gallops, or LV heave appreciated. Abdomen: Soft, non-tender, non-distended with normoactive bowel sounds. No hepatomegaly, No rebound/guarding. No obvious abdominal masses. MSK: Strength and tone appear normal for age. 5/5 in all  extremities Extremities: No edema. No clubbing or cyanosis. DP/PT pulses 1+ right. Wrap to left LE  Neuro: Alert and oriented to person and place. No focal deficits. No facial asymmetry. MAE spontaneously. Psych: Responds to questions somewhat appropriately with normal affect.    Labs    Chemistry Recent Labs  Lab 02/17/18 1416 02/18/18 0232 02/19/18 0741  NA 142 143 142  K 4.0 3.6 3.5  CL 105 108 101  CO2 27 24 24   GLUCOSE 215* 176* 218*  BUN 46* 42* 22  CREATININE 3.48* 2.81* 1.82*  CALCIUM 7.5* 8.0* 8.2*  PROT 6.0*  --   --   ALBUMIN 3.0*  --  3.0*  AST 148*  --   --   ALT 104*  --   --   ALKPHOS 56  --   --   BILITOT 1.0  --   --   GFRNONAA 16* 21* 36*  GFRAA 19* 24* 41*  ANIONGAP 10 11 17*     Hematology Recent Labs  Lab 02/17/18 1416 02/18/18 0232 02/19/18 0115  WBC 10.4 11.0* 15.2*  RBC 3.66* 3.57* 3.78*  HGB 10.6* 10.7* 11.3*  HCT 35.7* 33.7* 35.5*  MCV 97.5 94.4 93.9  MCH 29.0 30.0 29.9  MCHC 29.7* 31.8 31.8  RDW 15.2 14.9 15.2  PLT 163 179 209    Cardiac Enzymes Recent Labs  Lab 02/18/18 0852 02/18/18 1414 02/18/18 2124  TROPONINI 1.27* 1.49* 0.83*  No results for input(s): TROPIPOC in the last 168 hours.   BNPNo results for input(s): BNP, PROBNP in the last 168 hours.   DDimer No results for input(s): DDIMER in the last 168 hours.   Radiology    Dg Chest Port 1 View  Result Date: 02/17/2018 CLINICAL DATA:  Evaluate endotracheal tube placement. EXAM: PORTABLE CHEST 1 VIEW COMPARISON:  Earlier today at 0352 hours FINDINGS: 1833 hours. Endotracheal tube terminates 6.3 cm cm above carina. Numerous leads and wires project over the chest. Borderline cardiomegaly. Possible trace left pleural fluid. No pneumothorax. Low lung volumes with new or increased bibasilar atelectasis. IMPRESSION: 1. Appropriate position of endotracheal tube. 2. Low lung volumes with developing bibasilar atelectasis. Possible trace left pleural fluid or thickening.  Electronically Signed   By: Jeronimo GreavesKyle  Talbot M.D.   On: 02/17/2018 19:12   Telemetry    02/19/18 NSR with PACs- Personally Reviewed  ECG    No new tracing as of 02/19/18 Personally Reviewed - Cardiac Studies   Echo 02/18/2018 LV EF: 60% - 65% Study Conclusions  - Left ventricle: The cavity size was normal. Wall thickness was normal. Systolic function was normal. The estimated ejection fraction was in the range of 60% to 65%. Wall motion was normal; there were no regional wall motion abnormalities. Doppler parameters are consistent with abnormal left ventricular relaxation (grade 1 diastolic dysfunction).  Patient Profile     78 y.o. male with a hx of CAD s/p DES x3 to proximal to mid RCA in 2010 with repeat cardiac catheterization in 2011 with patent stents, COPD, IDDM, PAD, history of DVT and PE in 2010, moderate to severe pulmonary hypertension, obstructive sleep apnea, CKD stage III and morbid obesity who is being seen today for the evaluation of elevated trop while intubated for respiratory distress at the request of Dr. Craige CottaSood  Assessment & Plan    1. Demand ischemia: -Pt with post-operative respiratory distress and AMS who [facility]  -Echocardiogram with normal LV function  -Trop 1.27>1.49>0.83 without acute ACS symptoms>>likely in the setting of acute respiratory distress, hypotension, AKI -Currently denies chest pain -No indication for cardiac cath at this time. I don't anticipate any further cardiac evaluation in hospital. Could consider noninvasive stress testing later as outpatient depending on clinical status.   2. Acute respiratory distress s/p intubation: -Managed per PCCM  -[facility] with [facility] and became hypotension after the initiation of morphine >>received fluid resuscitation and was intubated for acute respiratory failure -Currently extubated and  mentating well   3. Acute on chronic renal insufficieny: -Creatinine on presentation 3.48>2.81>1.82 -Baseline appears to be in the 1.3 range  -Elevation likely in the setting of severe hypotension  -Continue to avoid nephrotoxic medications  4. Paroxysmal AF with RVR: -New AF [facility] without documentation>>>however appears to have remained in NSR with PAC's [facility] -On chronic Coumadin for recurrent PE   5. DVT/PE: -On chronic Coumadin of recurrent PE  6. CAD s/p PTCA/DES: -Denies recurrent chest pain  -On monotherapy with Plavix (stent in the RCA 2010, patent in 2011) and Coumadin (for DVT/PE) -Echocardiogram with normal LV function  -No ischemic workup with cath at this time however would consider   7. Left hip pain: -Continues to have left sided hip pain -Consider imagining of left hip to r/o acute changes    Signed, Georgie ChardJill McDaniel NP-C [facility] Pager: [phone] 02/19/2018, 9:19 AM  For questions or updates, please contact   Please consult [email or web address] for contact info under Cardiology/STEMI.    Patient seen and examined and history reviewed. Agree with above findings and plan. Deverne is well known to me. History and all labs reviewed. Telemetry last night did show tachycardia with HR up to 160. ? Atrial flutter. Since then in NSR with PACs and PVCs. No chest pain. I think his troponin leak and Ecg changes are all due to demand ischemia in setting of hypotension, Acute respiratory failure with hypoxia, AKI and ? Afib. Echo shows normal LV function. No further cardiac evaluation planned this hospital stay. From our standpoint he can resume coumadin since we do not plan any invasive procedures. Will follow   Daviana Haymaker [place], MDFACC 02/19/2018 11:11 AM

## 2018-02-19 NOTE — Progress Notes (Addendum)
eLink Physician-Brief Progress Note Patient Name: Paul Chen DOB: 1940-07-19 MRN: [id number]   Date of Service  02/19/2018  HPI/Events of Note  Patient c/o pain.   eICU Interventions  Will order: 1. Increase Dilaudid dose to Q 2 hour PRN.     Intervention Category Major Interventions: Other:  Lenell Antu 02/19/2018, 8:55 PM

## 2018-02-19 NOTE — Progress Notes (Signed)
NAME:  Paul Chen, MRN:  [id number], DOB:  29-Jun-1940, LOS: 2 ADMISSION DATE:  02/17/2018,  REFERRING MD: [facility] emergency department physician cHIEF COMPLAINT: Hypoxic respiratory failure  Brief History   Status post right second toe amputation 02/15/2018 status post fall on 02/17/2018 transferred to [facility] 02/17/2018 with hypercarbic hypoxic respiratory failure.Marland Kitchen  History of present illness   78 year old gentleman who is a former smoker and carries diagnosis of chronic obstructive pulmonary disease, poorly controlled diabetes mellitus, order artery disease status post stent in 2010, morbid obesity, congestive heart failure, atrial fibrillation with rapid ventricular response, and just had second toe amputation secondary to poorly controlled diabetes 02/15/2018 at [facility]. He presented to [facility] on a.m. 0 400 02/17/2018 secondary to a fall.  He was noted to be hypoxic.  He is also been a great deal of pain he was given morphine which caused him his blood pressure to fall out.  Was treated with IV fluids which led to increasing respiratory distress and need for noninvasive mechanical ventilatory support. He is transferred to [facility] and found to be hypoxic hypercarbic and with a respiratory acidosis.  He is got a plethora of health issues and may require intubation and further management mechanical ventilatory support.  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 Diabetes mellitus poorly controlled Hypertension Atrial fibrillation with biventricular response DVT with pulmonary embolism Generalized failure to thrive Morbid obesity Coronary artery disease status post stent  Significant Hospital Events   02/17/2018 transferred to [facility] from [facility] after a fall  Consults:    Procedures:    Significant Diagnostic Tests:    Micro Data:  02/17/2018 blood cultures x2>>  negative 02/17/2018 urine culture>> negative  Antimicrobials:  02/17/2018 vancomycin>>1/19 02/17/2018 Zosyn>>1/19  Interim history/subjective:  Patient is having left hip pain and elevated blood pressure overnight.  Dilaudid was added to his home oxycodone by [facility]. Patient continued to complain of left hip pain which is a chronic issue.  He was asking for more pain meds.  Objective   Blood pressure (!) 161/54, pulse 78, temperature 98.5 F (36.9 C), temperature source Axillary, resp. rate 18, height 5\' 10"  (1.778 m), SpO2 99 %.    Vent Mode: PSV;CPAP FiO2 (%):  [40 %] 40 % PEEP:  [5 cmH20] 5 cmH20 Pressure Support:  [8 cmH20] 8 cmH20   Intake/Output Summary (Last 24 hours) at 02/19/2018 1046 Last data filed at 02/19/2018 0600 Gross per 24 hour  Intake 641.17 ml  Output 5335 ml  Net -4693.83 ml   There were no vitals filed for this visit.  Examination: General: Well-developed, obese gentleman, appears little uncomfortable due to pain, in no acute distress. HENT:  AT, EOMI, JVD Lungs: Clear bilaterally. Cardiovascular: Regular rate and rhythm, no murmurs Abdomen: Obese, soft, tender, positive bowel sounds. Extremities: Left lower extremity with extensive changes of venous stasis positive edema multiple ulcers. Right lower extremity with clean dressing  Neuro: Alert and oriented x3, strength and sensations grossly intact and symmetrical. GU: No Foley at this time.  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  COPD former smoker Obstructive sleep apnea. Noted to have a pH of 7.26 with a PCO2 of 67.  Most likely component of obstructive sleep apnea worsened by narcotic use for recent foot surgery. ABGs has been improved. -Patient was extubated  successfully yesterday, used BiPAP overnight.  Doing well from respiratory standpoint. -Bronchodilators -Lasix 40 mg  IV. -Stable enough to be transferred out of ICU.  Atrial fibrillation with open ventricular response. History of coronary artery disease with stent to the proximal right coronary 2010 History of pulmonary embolism with history of deep vein thrombosis. Currently sinus bradycardia with normal PR interval. -Twelve-lead EKG-shows new onset ST depression in anterolateral leads which were not there on January 16.  Apparently elevated troponin at outside hospital.  Concern for NSTEMI. -Cardiology is following-does not think this is ACS, most likely due to demand in the setting of hypoxia. -Repeat echocardiogram was without any change, grade 1 diastolic dysfunction with normal EF. -We will restart home Coumadin and discontinue heparin as there is no plan for any cardiac intervention at this time.  Hypertension.  Initially softer blood pressure so home meds were held. Currently elevated blood pressure, patient is also experiencing a lot of left hip pain. -IV hydralazine 5 mg every 6 hourly for systolic blood pressure above 403, make sure that he is not in pain, pain meds should be tried first. -Increase home dose of amlodipine to 10 mg daily -holding lisinopril due to AKI.  AKI.  Most likely secondary to hypoxia. Creatinine improving, currently 1.82 with baseline of 1.2. -Continue monitoring. -Avoid nephrotoxic.  Worsening left hip pain.  Patient has an history of chronic left hip pain requiring 4-6 doses of oxycodone daily.  Continue to experience worsening left hip pain since his fall.  No recent recent imaging in the system. -Left hip imaging. -Continue home dose of oxycodone. -Dilaudid 1 mg as needed.  Status post recent fall.  CT performed at [facility] reveals no acute injury. -Monitor mental status   Diabetes mellitus.  A1c in July 2019 was 6.6. CBG between 196-268 -Sliding scale insulin. -Add Lantus 20 units at bedtime. 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  Performed with Dr. Victorino Dike with assistance from [facility]. -Ortho is following-appreciate their recommendations. -Daily dressing change per Ortho. -No need for antibiotics for this amputation.  History of depression -Antidepressants as needed  Best practice:  Diet: Heart healthy Pain/Anxiety/Delirium protocol (if indicated): Narcotics as needed for surgical pain VAP protocol (if indicated): NA DVT prophylaxis: On Coumadin GI prophylaxis: Proton pump inhibitor Glucose control: Sliding scale insulin Mobility: As tolerated Code Status: Full code Family Communication: 02/18/2018 .  Daughter updated at bedside. Disposition: Ready to be transferred to step down.  Labs   CBC: Recent Labs  Lab 02/17/18 1416 02/18/18 0232 02/19/18 0115  WBC 10.4 11.0* 15.2*  HGB 10.6* 10.7* 11.3*  HCT 35.7* 33.7* 35.5*  MCV 97.5 94.4 93.9  PLT 163 179 209    Basic Metabolic Panel: Recent Labs  Lab 02/17/18 1416 02/18/18 0232 02/19/18 0741  NA 142 143 142  K 4.0 3.6 3.5  CL 105 108 101  CO2 27 24 24   GLUCOSE 215* 176* 218*  BUN 46* 42* 22  CREATININE 3.48* 2.81* 1.82*  CALCIUM 7.5* 8.0* 8.2*  MG 2.5*  --   --   PHOS 5.0*  --  3.1   GFR: Estimated Creatinine Clearance: 44.2 mL/min (A) (by C-G formula based on SCr of 1.82 mg/dL (H)). Recent Labs  Lab 02/17/18 1416 02/17/18 1816 02/18/18 0232 02/19/18 0115  PROCALCITON 0.21  --   --   --   WBC 10.4  --  11.0* 15.2*  LATICACIDVEN 0.9 1.4  --   --     Liver Function Tests: Recent Labs  Lab 02/17/18 1416 02/19/18 0741  AST 148*  --   ALT 104*  --   ALKPHOS 56  --   BILITOT 1.0  --   PROT 6.0*  --   ALBUMIN 3.0* 3.0*   Recent Labs  Lab 02/17/18 1416  LIPASE 20  AMYLASE 13*   No results for input(s): AMMONIA in the last 168 hours.  ABG    Component Value Date/Time   PHART 7.465 (H) 02/17/2018 2007   PCO2ART 38.0 02/17/2018  2007   PO2ART 400.0 (H) 02/17/2018 2007   HCO3 27.4 02/17/2018 2007   TCO2 29 02/17/2018 2007   O2SAT 100.0 02/17/2018 2007     Coagulation Profile: Recent Labs  Lab 02/15/18 0749 02/17/18 1416  INR 1.16 1.28    Cardiac Enzymes: Recent Labs  Lab 02/18/18 0852 02/18/18 1414 02/18/18 2124  TROPONINI 1.27* 1.49* 0.83*    HbA1C: Hgb A1c MFr Bld  Date/Time Value Ref Range Status  08/09/2017 02:02 PM 6.6 (H) 4.8 - 5.6 % Final    Comment:             Prediabetes: 5.7 - 6.4          Diabetes: >6.4          Glycemic control for adults with diabetes: <7.0     CBG: Recent Labs  Lab 02/18/18 1618 02/18/18 2014 02/18/18 2324 02/19/18 0342 02/19/18 0733  GLUCAP 150* 148* 216* 215* 196*    Review of Systems:   Not available secondary to confusion.  Past Medical History  He,  has a past medical history of Arthritis, Chronic renal insufficiency, CKD (chronic kidney disease), stage III (HCC), COPD (chronic obstructive pulmonary disease) (HCC), Coronary disease (October 2010), Depression, Diabetes mellitus, Diastolic dysfunction, Dyspnea, Gout, History of blood transfusion, History of diabetic ulcer of foot, Hypertension, Hypoventilation, Obesity, Pneumonia, Pulmonary embolism [facility](HCC) (March 2010), Pulmonary hypertension [facility](HCC), Retroperitoneal bleed, Sleep apnea, and Wears dentures.   Surgical History    Past Surgical History:  Procedure Laterality Date  . ABDOMINAL AORTOGRAM W/LOWER EXTREMITY Right 01/15/2018   Procedure: ABDOMINAL AORTOGRAM W/LOWER EXTREMITY;  Surgeon: Maeola Harmanain, Brandon Christopher, MD;  [facility];  Service: Cardiovascular;  Laterality: Right;  . AMPUTATION TOE Left 05/04/2017   Procedure: Left hallux amputation;  Surgeon: Toni ArthursHewitt, John, MD;  Location: [facility];  Service: Orthopedics;  Laterality: Left;  . AMPUTATION TOE Right 12/21/2017   Procedure: Right hallux amputation;  Surgeon: Toni ArthursHewitt, John, MD;  Location: [facility];  Service: Orthopedics;  Laterality: Right;  60min  . AMPUTATION TOE Right 02/15/2018   Procedure: Right 2nd toe amputation;  Surgeon: Toni ArthursHewitt, John, MD;  [facility];  Service: Orthopedics;  Laterality: Right;  60  . BACK SURGERY  2000   post  . CARDIOVASCULAR STRESS TEST  11-14-2008   EF 44%  . EXPLORATORY LAPAROTOMY  1972   POST  . LOWER EXTREMITY ANGIOGRAPHY N/A 05/01/2017   Procedure: LOWER EXTREMITY ANGIOGRAPHY;  Surgeon: Maeola Harmanain, Brandon Christopher, MD;  [facility];  Service: Cardiovascular;  Laterality: N/A;  . LOWER EXTREMITY ANGIOGRAPHY N/A 05/15/2017   Procedure: LOWER EXTREMITY ANGIOGRAPHY;  Surgeon: Maeola Harmanain, Brandon Christopher, MD;  [facility];  Service: Cardiovascular;  Laterality: N/A;  . MULTIPLE TOOTH EXTRACTIONS    . PERIPHERAL VASCULAR ATHERECTOMY  05/01/2017   Procedure: PERIPHERAL VASCULAR  ATHERECTOMY;  Surgeon: Maeola Harman, MD;  Location: [facility] INVASIVE CV LAB;  Service: Cardiovascular;;  Left SFA  . PERIPHERAL VASCULAR BALLOON ANGIOPLASTY  05/01/2017   Procedure: PERIPHERAL VASCULAR BALLOON ANGIOPLASTY;  Surgeon: Maeola Harman, MD;  Location: [facility] INVASIVE CV LAB;  Service: Cardiovascular;;  Left Anterial Tibial Artery  . PERIPHERAL VASCULAR BALLOON ANGIOPLASTY Right 01/15/2018   Procedure: PERIPHERAL VASCULAR BALLOON ANGIOPLASTY;  Surgeon: Maeola Harman, MD;  Location: [facility] INVASIVE CV LAB;  Service: Cardiovascular;  Laterality: Right;  SFA atherectomy  . PERIPHERAL VASCULAR INTERVENTION Right 05/15/2017   Procedure: PERIPHERAL VASCULAR INTERVENTION;  Surgeon: Maeola Harman, MD;  Location: [facility] INVASIVE CV LAB;  Service: Cardiovascular;  Laterality: Right;  SFA Stent  . US ECHOCARDIOGRAPHY  11-25-2008   EF 55-60%     Social History   reports that he quit smoking about 38 years ago. He quit after 2.00 years of use. He has never used smokeless tobacco. He reports previous alcohol use. He  reports that he does not use drugs.   Family History   His family history includes Coronary artery disease in his father and mother; Heart attack in his father and mother; Hypertension in his mother.   Allergies No Known Allergies   Home Medications  Prior to Admission medications   Medication Sig Start Date End Date Taking? Authorizing Provider  albuterol (PROVENTIL) (2.5 MG/3ML) 0.083% nebulizer solution Take 2.5 mg by nebulization every 4 (four) hours as needed for wheezing or shortness of breath.  12/05/16   [provider]  allopurinol (ZYLOPRIM) 300 MG tablet Take 300 mg by mouth daily.  02/21/15   [provider]  amLODipine (NORVASC) 5 MG tablet Take 1 tablet (5 mg total) by mouth daily. 08/09/17 08/04/18  [place], Peter M, MD  atorvastatin (LIPITOR) 40 MG tablet Take 1 tablet (40 mg total) by mouth daily. 01/07/13   [place], Peter M, MD  busPIRone (BUSPAR) 15 MG tablet Take 15 mg by mouth 2 (two) times daily.  12/04/12   [provider]  cephALEXin (KEFLEX) 500 MG capsule Take 500 mg by mouth 4 (four) times daily.    [provider]  ciclopirox (PENLAC) 8 % solution Apply 1 application topically at bedtime. Apply over nail and surrounding skin. Apply daily over previous coat. After seven (7) days, may remove with alcohol and continue cycle.    [provider]  clopidogrel (PLAVIX) 75 MG tablet TAKE ONE TABLET BY MOUTH EVERY MORNING 04/17/17   [place], Peter M, MD  doxazosin (CARDURA) 4 MG tablet Take 4 mg by mouth every evening.     [provider]  DULoxetine (CYMBALTA) 60 MG capsule Take 60 mg by mouth daily.      [provider]  enoxaparin (LOVENOX) 120 MG/0.8ML injection Inject 0.8 mLs (120 mg total) into the skin every 12 (twelve) hours for 3 days. Patient taking differently: Inject 120 mg into the skin every 12 (twelve) hours.  01/12/18 02/12/18  Maeola Harman, MD  FLOVENT HFA 44 MCG/ACT inhaler Inhale 3 puffs into  the lungs 2 (two) times daily.  05/09/11   [provider]  furosemide (LASIX) 20 MG tablet Take 40 mg by mouth daily.  02/21/15   [provider]  gabapentin (NEURONTIN) 300 MG capsule Take 600 mg by mouth 2 (two) times daily.    [provider]  glucagon 1 MG injection Inject 1 mg into the vein once as needed (for low blood sugars.).  [provider]  Insulin Glargine (BASAGLAR KWIKPEN) 100 UNIT/ML SOPN Inject 40 Units into the skin daily.    [provider]  ipratropium (ATROVENT) 0.02 % nebulizer solution Take 0.5 mg by nebulization every 4 (four) hours as needed for wheezing or shortness of breath.     [provider]  lisinopril (PRINIVIL,ZESTRIL) 10 MG tablet Take 10 mg by mouth daily. 04/19/17   [provider]  Magnesium Oxide 400 (240 Mg) MG TABS Take 400 mg by mouth 2 (two) times daily. 04/19/17   [provider]  metoprolol succinate (TOPROL-XL) 100 MG 24 hr tablet Take 150 mg by mouth daily.  10/18/12   [provider]  Multiple Vitamins-Minerals (CENTRUM SILVER ULTRA MENS PO) Take 1 tablet by mouth daily. 04/19/17   [provider]  naloxone [facility]) nasal spray 4 mg/0.1 mL Place 1 spray into the nose as needed (opioid overdose).     [provider]  nitroGLYCERIN (NITROSTAT) 0.4 MG SL tablet Place 0.4 mg under the tongue every 5 (five) minutes as needed for chest pain.     [provider]  NOVOLOG FLEXPEN 100 UNIT/ML FlexPen Inject 8-12 Units into the skin 2 (two) times daily with a meal.  12/11/17   [provider]  Omega-3 Fatty Acids (FISH OIL) 1000 MG CAPS Take 1,000 mg by mouth at bedtime.    [provider]  oxyCODONE (ROXICODONE) 15 MG immediate release tablet Take 15 mg by mouth 4 (four) times daily as needed for pain.     [provider]  oxyCODONE ER (XTAMPZA ER) 36 MG C12A Take 36 mg by mouth every 12 (twelve) hours.    [provider]   silver sulfADIAZINE (SILVADENE) 1 % cream Apply 1 application topically daily as needed (wound care). apply to toes    [provider]  warfarin (COUMADIN) 2.5 MG tablet Take 2.5 mg by mouth every evening.  02/21/15   [provider]     Critical care time: 45 min app    Arnetha Courser MD. PGY3 Pager [phone] If no answer page [phone]- [phone] 02/19/2018, 10:46 AM

## 2018-02-19 NOTE — Evaluation (Signed)
Clinical/Bedside Swallow Evaluation Patient Details  Name: Paul Chen MRN: [id number] Date of Birth: 15-Dec-1940  Today's Date: 02/19/2018 Time: SLP Start Time (ACUTE ONLY): 1000 SLP Stop Time (ACUTE ONLY): 1025 SLP Time Calculation (min) (ACUTE ONLY): 25 min  Past Medical History:  Past Medical History:  Diagnosis Date  . Arthritis   . Chronic renal insufficiency   . CKD (chronic kidney disease), stage III (HCC)   . COPD (chronic obstructive pulmonary disease) (HCC)    with restriction  . Coronary disease October 2010   Status post stenting of the proximal to mid right coronary with DES.  Status post extensive stenting of the proximal right coronary to the crux.  He has moderate disease in  the left coronary system  . Depression   . Diabetes mellitus    Insulin Dependent  . Diastolic dysfunction    Congestive heart failure with  . Dyspnea    multifactorial. chronic, 11/18/9- not an issue at this time  . Gout   . History of blood transfusion   . History of diabetic ulcer of foot    right  . Hypertension   . Hypoventilation    syndrome  . Obesity   . Pneumonia   . Pulmonary embolism [facility]) March 2010   hx of and DVT in March of 2010 on chronic Coumadin therapy  . Pulmonary hypertension (HCC)    Moderate to severe  . Retroperitoneal bleed    Hx of right pelvic  . Sleep apnea    Obstructive , no CPAP  . Wears dentures    Past Surgical History:  Past Surgical History:  Procedure Laterality Date  . ABDOMINAL AORTOGRAM W/LOWER EXTREMITY Right 01/15/2018   Procedure: ABDOMINAL AORTOGRAM W/LOWER EXTREMITY;  Surgeon: Maeola Harman, MD;  Location: [facility] INVASIVE CV LAB;  Service: Cardiovascular;  Laterality: Right;  . AMPUTATION TOE Left 05/04/2017   Procedure: Left hallux amputation;  Surgeon: Toni Arthurs, MD;  Location: [facility];  Service: Orthopedics;  Laterality: Left;  . AMPUTATION TOE Right 12/21/2017   Procedure: Right hallux amputation;   Surgeon: Toni Arthurs, MD;  Location: [facility];  Service: Orthopedics;  Laterality: Right;   . AMPUTATION TOE Right 02/15/2018   Procedure: Right 2nd toe amputation;  Surgeon: Toni Arthurs, MD;  Location: [facility] OR;  Service: Orthopedics;  Laterality: Right;  60  . BACK SURGERY  2000   post  . CARDIOVASCULAR STRESS TEST  11-14-2008   EF 44%  . EXPLORATORY LAPAROTOMY  1972   POST  . LOWER EXTREMITY ANGIOGRAPHY N/A 05/01/2017   Procedure: LOWER EXTREMITY ANGIOGRAPHY;  Surgeon: Maeola Harman, MD;  Location: [facility] INVASIVE CV LAB;  Service: Cardiovascular;  Laterality: N/A;  . LOWER EXTREMITY ANGIOGRAPHY N/A 05/15/2017   Procedure: LOWER EXTREMITY ANGIOGRAPHY;  Surgeon: Maeola Harman, MD;  Location: [facility] INVASIVE CV LAB;  Service: Cardiovascular;  Laterality: N/A;  . MULTIPLE TOOTH EXTRACTIONS    . PERIPHERAL VASCULAR ATHERECTOMY  05/01/2017   Procedure: PERIPHERAL VASCULAR ATHERECTOMY;  Surgeon: Maeola Harman, MD;  Location: [facility] INVASIVE CV LAB;  Service: Cardiovascular;;  Left SFA  . PERIPHERAL VASCULAR BALLOON ANGIOPLASTY  05/01/2017   Procedure: PERIPHERAL VASCULAR BALLOON ANGIOPLASTY;  Surgeon: Maeola Harman, MD;  Location: [facility] INVASIVE CV LAB;  Service: Cardiovascular;;  Left Anterial Tibial Artery  . PERIPHERAL VASCULAR BALLOON ANGIOPLASTY Right 01/15/2018   Procedure: PERIPHERAL VASCULAR BALLOON ANGIOPLASTY;  Surgeon: Maeola Harman, MD;  Location: [facility] INVASIVE CV LAB;  Service: Cardiovascular;  Laterality: Right;  SFA atherectomy  . PERIPHERAL VASCULAR INTERVENTION Right 05/15/2017   Procedure: PERIPHERAL VASCULAR INTERVENTION;  Surgeon: Maeola Harman, MD;  Location: [facility] INVASIVE CV LAB;  Service: Cardiovascular;  Laterality: Right;  SFA Stent  . US ECHOCARDIOGRAPHY  11-25-2008   EF 55-60%   HPI:  78 y.o. male admitted 02/15/2018 with right second toe ulcer, for amputation. to ICU 02/17/2018 PMH: DM (poorly  controlled), PVD, CAD, COPD, IDDM, PAD, history of DVT, PE (2010), moderate to severe pulmonary hypertension, OSA, CKD stage III and morbid obesity. Pt developed respiratory failure and was intubated 02/17/2018, extubated 02/18/2018 @ 1720 CXR =Low lung volumes with developing bibasilar atelectasis.    Assessment / Plan / Recommendation Clinical Impression  Pt seen for assessment of swallow function and safety after 24 hours intubated. Oral care completed with suction. Pt oral cavity noted to be healthy, with missing dentition. Adequate strength. Pt breakfast tray arrived just after oral care, so pt was assisted with self feeding. Pt tolerated toast, eggs, fruit and juice without obvious oral issues or overt s/s aspiration. Recommend continuing with current (regular) diet and thin liquids. Routine safe swallow precautions. No further ST intevention is recommended at this time. Please reconsult if needs arise.    SLP Visit Diagnosis: Dysphagia, unspecified (R13.10)    Aspiration Risk  Mild aspiration risk    Diet Recommendation Regular;Thin liquid   Liquid Administration via: Cup;Straw Medication Administration: (per pt preference) Supervision: Staff to assist with self feeding;Patient able to self feed Compensations: Minimize environmental distractions;Small sips/bites;Slow rate Postural Changes: Seated upright at 90 degrees    Other  Recommendations Oral Care Recommendations: Oral care QID   Follow up Recommendations None          Prognosis Prognosis for Safe Diet Advancement: Good      Swallow Study   General Date of Onset: 02/15/18 HPI: 78 y.o. male admitted 02/15/2018 with right second toe ulcer, for amputation. to ICU 02/17/2018 PMH: DM (poorly controlled), PVD, CAD, COPD, IDDM, PAD, history of DVT, PE (2010), moderate to severe pulmonary hypertension, OSA, CKD stage III and morbid obesity. Pt developed respiratory failure and was intubated 02/17/2018, extubated 02/18/2018 @ 1720 CXR  =Low lung volumes with developing bibasilar atelectasis.  Type of Study: Bedside Swallow Evaluation Previous Swallow Assessment: none Diet Prior to this Study: Regular Temperature Spikes Noted: No Respiratory Status: Nasal cannula History of Recent Intubation: Yes Length of Intubations (days): 1 days Date extubated: 02/18/18 Behavior/Cognition: Alert;Cooperative;Pleasant mood Oral Cavity Assessment: Within Functional Limits Oral Care Completed by SLP: Yes Oral Cavity - Dentition: Missing dentition Vision: Functional for self-feeding Self-Feeding Abilities: Needs assist;Needs set up Patient Positioning: Upright in bed Baseline Vocal Quality: Normal Volitional Cough: Strong Volitional Swallow: Able to elicit    Oral/Motor/Sensory Function Overall Oral Motor/Sensory Function: Within functional limits   Ice Chips Ice chips: Not tested   Thin Liquid Thin Liquid: Within functional limits Presentation: Straw    Nectar Thick Nectar Thick Liquid: Not tested   Honey Thick Honey Thick Liquid: Not tested   Puree Puree: Within functional limits Presentation: Spoon   Solid     Solid: Within functional limits Presentation: Self Fed     Tami Barren B. Murvin Natal, [facility], CCC-SLP Speech Language Pathologist [phone]  [place] 02/19/2018,10:25 AM

## 2018-02-19 NOTE — Progress Notes (Addendum)
ANTICOAGULATION CONSULT NOTE - Initial Consult  Pharmacy Consult for Heparin>lovenox + warfarin Indication: atrial fibrillation and hx of DVT and PE   No Known Allergies  Patient Measurements: Heparin Dosing Weight: 97.9kg  Vital Signs: Temp: 98.2 F (36.8 C) (01/20 1200) Temp Source: Oral (01/20 1200) BP: 166/56 (01/20 1200) Pulse Rate: 93 (01/20 1200)  Labs: Recent Labs    02/17/18 1416  02/18/18 0232 02/18/18 0852 02/18/18 1129 02/18/18 1414 02/18/18 2124 02/19/18 0115 02/19/18 0741  HGB 10.6*  --  10.7*  --   --   --   --  11.3*  --   HCT 35.7*  --  33.7*  --   --   --   --  35.5*  --   PLT 163  --  179  --   --   --   --  209  --   APTT >200*  --   --   --   --   --   --   --   --   LABPROT 15.8*  --   --   --   --   --   --   --   --   INR 1.28  --   --   --   --   --   --   --   --   HEPARINUNFRC  --    < > 0.79*  --  0.61  --   --  0.41  --   CREATININE 3.48*  --  2.81*  --   --   --   --   --  1.82*  TROPONINI  --   --   --  1.27*  --  1.49* 0.83*  --   --    < > = values in this interval not displayed.   Estimated Creatinine Clearance: 44.2 mL/min (A) (by C-G formula based on SCr of 1.82 mg/dL (H)).  Assessment: 63 yoM presenting [facility] from OSH after a fall with h/o Afib and PE and DVT on warfarin 2.5 mg daily PTA, recently held for 2nd toe amputation performed 1/16 (INR 1.16), discharged with BID enoxaparin bridge to therapeutic INR. Pt has not been eating well per RN.   Dr. Denese Killings consulted pharmacy to start patient back on warfarin. Bridging with enoxaparin because last INR subtherapeutic at 1.28. No bleeding reported per RN.    Hgb WNL, pltc slightly increasing. CrCl > 30 ml/min.   Goal of Therapy:  INR 2-3 Anti-Xa level 0.6-1 units/ml 4hrs after LMWH dose given Monitor platelets by anticoagulation protocol: Yes   Plan:  Discontinue heparin  Initiate enoxaparin 115 mg (1 mg/kg) SQ q12h one hour after heparin gtt stops - discussed plan with RN   Initiate warfarin 2.5 mg PO x1  F/U daily INR, CBC q72h, and s/sx of bleeding   Delight Hoh, PharmD Candidate 2020 02/19/2018,1:12 PM

## 2018-02-19 NOTE — Evaluation (Signed)
Physical Therapy Evaluation Patient Details Name: Paul RockersDanny L Chen MRN: [id number] DOB: 1940-09-15 Today's Date: 02/19/2018   History of Present Illness  Had R great toe amputation ( WBAT in postop shoe) 2 days prior to presentation to hospital for respiratory failure on 1/18; Extubated 1/19;  Extreme difficulty getting comfortable, limited by pain form long-standing L hip arthritis; has a past medical history of Arthritis, Chronic renal insufficiency, CKD (chronic kidney disease), stage III (HCC), COPD (chronic obstructive pulmonary disease) (HCC), Coronary disease (October 2010), Depression, Diabetes mellitus, Diastolic dysfunction, Dyspnea, Gout, History of blood transfusion, History of diabetic ulcer of foot, Hypertension, Hypoventilation, Obesity, Pneumonia, Pulmonary embolism [facility](HCC) (March 2010), Sleep apnea  Clinical Impression   Pt admitted with above diagnosis and medical course. Pt currently with functional limitations due to the deficits listed below (see PT Problem List). Independent including driving prior to admission; Daughter, Junious DresserConnie told us that he has been going to a pin clinic for ongoing L hip pain; Presents  With decr functional mobility, decr activity tolerance, pain L hip effecting mobility and tolerance; Much difficulty finding a position of comfort for Mr. Angelina OkHigbon;  Pt will benefit from skilled PT to increase their independence and safety with mobility to allow discharge to the venue listed below.       Follow Up Recommendations SNF    Equipment Recommendations  Other (comment)(to be determined)    Recommendations for Other Services       Precautions / Restrictions Precautions Precautions: Fall Required Braces or Orthoses: Other Brace Other Brace: Post op shoe; WBAT in postop shoe per Ortho Consult note Restrictions RLE Weight Bearing: Weight bearing as tolerated      Mobility  Bed Mobility Overal bed mobility: Needs Assistance Bed Mobility: Supine to Sit      Supine to sit: Max assist;HOB elevated     General bed mobility comments: Requiring Max A for managing BLEs towards EOB and elevating trunk. Decreased tolerance due to pain at left hip  Transfers Overall transfer level: Needs assistance Equipment used: Rolling walker (2 wheeled) Transfers: Sit to/from [employer] CorporationStand;Stand Pivot Transfers Sit to Stand: Mod assist;+2 physical assistance;+2 safety/equipment Stand pivot transfers: Mod assist;+2 physical assistance       General transfer comment: Pt requiring Mod A +2 for powering up into standing from EOB. Cues for hand placement instead of leaning heavily on the walker. Pt requiring Mod A +2 for safe pivot to recliner and cues for RW management. Mod A for second stand from recliner.   Ambulation/Gait Ambulation/Gait assistance: Mod assist;+2 safety/equipment(lines/leads) Gait Distance (Feet): (Pivotal steps bed to recliner) Assistive device: Rolling walker (2 wheeled) Gait Pattern/deviations: Shuffle     General Gait Details: Cues to bear down into RW to unweigh painful L hip in standing/upright; VERY painful L hip, not much pain R foot;  Stairs            Wheelchair Mobility    Modified Rankin (Stroke Patients Only)       Balance Overall balance assessment: Needs assistance Sitting-balance support: Bilateral upper extremity supported;Feet supported Sitting balance-Leahy Scale: Poor                                       Pertinent Vitals/Pain Pain Assessment: 0-10 Pain Score: 9 ("9 1/2") Pain Location: L hip from long-standing L hip arthritis Pain Descriptors / Indicators: Aching;Grimacing;Moaning;Constant Pain Intervention(s): Monitored during session;Premedicated before session;Repositioned    Home  Living Family/patient expects to be discharged to:: Private residence   Available Help at Discharge: Family;Available 24 hours/day Type of Home: House Home Access: Stairs to enter Entrance Stairs-Rails:  Doctor, general practice of Steps: 2 Home Layout: Two level;Bed/bath upstairs        Prior Function Level of Independence: Independent with assistive device(s)         Comments: Uses cane predominantly iwth amb     Hand Dominance        Extremity/Trunk Assessment   Upper Extremity Assessment Upper Extremity Assessment: Defer to OT evaluation    Lower Extremity Assessment Lower Extremity Assessment: Generalized weakness;RLE deficits/detail;LLE deficits/detail RLE Deficits / Details: R foot wound dressed in kerlix; he did tolerate bearing weight on foot LLE Deficits / Details: Severe hip OA pain, limiting weight bearing and ROM; unable to find a position of comfort during session LLE: Unable to fully assess due to pain       Communication   Communication: No difficulties  Cognition Arousal/Alertness: Awake/alert Behavior During Therapy: WFL for tasks assessed/performed Overall Cognitive Status: Within Functional Limits for tasks assessed(for simple mobility)                                 General Comments: Tending to be quite internally distracted by pain      General Comments General comments (skin integrity, edema, etc.): Portion of session conducted on Room Air and O2 sats decr to 83%; restarted supplemental O2 and notified RN    Exercises     Assessment/Plan    PT Assessment Patient needs continued PT services  PT Problem List Decreased strength;Decreased range of motion;Decreased activity tolerance;Decreased balance;Decreased mobility;Decreased coordination;Decreased knowledge of use of DME;Decreased safety awareness;Decreased knowledge of precautions;Pain;Cardiopulmonary status limiting activity       PT Treatment Interventions DME instruction;Gait training;Functional mobility training;Therapeutic activities;Therapeutic exercise;Balance training;Patient/family education    PT Goals (Current goals can be found in the Care Plan  section)  Acute Rehab PT Goals Patient Stated Goal: Did not state PT Goal Formulation: Patient unable to participate in goal setting Time For Goal Achievement: 03/05/18 Potential to Achieve Goals: Fair    Frequency Min 2X/week   Barriers to discharge        Co-evaluation               AM-PAC PT "6 Clicks" Mobility  Outcome Measure Help needed turning from your back to your side while in a flat bed without using bedrails?: A Lot Help needed moving from lying on your back to sitting on the side of a flat bed without using bedrails?: A Lot Help needed moving to and from a bed to a chair (including a wheelchair)?: A Lot Help needed standing up from a chair using your arms (e.g., wheelchair or bedside chair)?: A Lot Help needed to walk in hospital room?: A Lot Help needed climbing 3-5 steps with a railing? : Total 6 Click Score: 11    End of Session Equipment Utilized During Treatment: Gait belt Activity Tolerance: Patient limited by pain Patient left: in chair;with call bell/phone within reach;with family/visitor present;with nursing/sitter in room Nurse Communication: Mobility status PT Visit Diagnosis: Other abnormalities of gait and mobility (R26.89);Pain Pain - Right/Left: Left Pain - part of body: Hip    Time: 1610-9604 PT Time Calculation (min) (ACUTE ONLY): 23 min   Charges:   PT Evaluation $PT Eval Moderate Complexity: 1 Mod  Van Clines, [place]  Acute Rehabilitation Services Pager [phone] Office [phone]   Levi Aland 02/19/2018, 9:54 AM

## 2018-02-19 NOTE — NC FL2 (Signed)
[place] MEDICAID FL2 LEVEL OF CARE SCREENING TOOL     IDENTIFICATION  Patient Name: Paul Chen Birthdate: [birth year] Sex: male Admission Date (Current Location): 02/17/2018  [facility] and [place] Number:  Producer, television/film/video and Address:  [facility] [place], [address], [place], [place] [address]      Provider Number: [id number]  Attending Physician Name and Address:  Lynnell Catalan, MD  Relative Name and Phone Number:       Current Level of Care: Hospital Recommended Level of Care: Skilled Nursing Facility Prior Approval Number:    Date Approved/Denied:   PASRR Number: pending  Discharge Plan: SNF    Current Diagnoses: Patient Active Problem List   Diagnosis Date Noted  . Respiratory failure (HCC) 02/17/2018  . 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 Carotid artery disease (HCC) 08/20/2010  . Hyperlipidemia 08/20/2010  . 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 HEMOPERITONEUM 03/11/2009  . OTHER ABNORMALITY OF URINATION 03/11/2009  . OBESITY 01/08/2009  . INADEQUATE SLEEP HYGIENE 01/08/2009  . OBSTRUCTIVE SLEEP APNEA 01/08/2009  . LOW BACK PAIN, CHRONIC 01/08/2009  . Coronary atherosclerosis 12/17/2008  . HYPERTENSION, PULMONARY 12/17/2008  . SHORTNESS OF BREATH 12/17/2008  . DIABETES, TYPE 1 12/16/2008  . DEPRESSION 12/16/2008  . Essential hypertension 12/16/2008  . OSTEOARTHRITIS 12/16/2008    Orientation RESPIRATION BLADDER Height & Weight     Self, Time, Situation, Place  O2(Nasal Cannula 3L) Continent Weight:   Height:  5\' 10"  (177.8 cm)  BEHAVIORAL SYMPTOMS/MOOD NEUROLOGICAL BOWEL NUTRITION STATUS      Continent Diet(heart healthy/carb modified, thin liquids)  AMBULATORY STATUS COMMUNICATION OF NEEDS Skin   Extensive Assist Verbally Surgical wounds(Closed incision, back (left), foam dressing, change PRN.  Closed incision on leg (right), Gauze (Comment);Tape dressing, change daily.  Closed incision left foot)                       Personal Care Assistance Level of Assistance  Dressing, Feeding, Bathing Bathing Assistance: Maximum assistance Feeding assistance: Limited assistance Dressing Assistance: Maximum assistance     Functional Limitations Info  Sight, Hearing, Speech Sight Info: Adequate Hearing Info: Adequate Speech Info: Adequate    SPECIAL CARE FACTORS FREQUENCY  PT (By licensed PT), OT (By licensed OT)     PT Frequency: 2x OT Frequency: 2x            Contractures Contractures Info: Not present    Additional Factors Info  Code Status, Allergies Code Status Info: Full Code Allergies Info: No known allergies           Current Medications (02/19/2018):  This is the current hospital active medication list Current Facility-Administered Medications  Medication Dose Route Frequency Provider Last Rate Last Dose  . 0.9 %  sodium chloride infusion  250 mL Intravenous Continuous Minor, Vilinda Blanks, NP 10 mL/hr at 02/19/18 1200    . allopurinol (ZYLOPRIM) tablet 300 mg  300 mg Oral Daily Arnetha Courser, MD   300 mg at 02/19/18 6384  . amLODipine (NORVASC) tablet 10 mg  10 mg Oral Daily Arnetha Courser, MD   10 mg at 02/19/18 0924  . atorvastatin (LIPITOR) tablet 40 mg  40 mg Oral Daily Arnetha Courser, MD   40 mg at 02/19/18 1321  . busPIRone (BUSPAR) tablet 15 mg  15 mg Oral BID Arnetha Courser, MD   15 mg at 02/19/18 1321  . clopidogrel (PLAVIX) tablet 75 mg  75  mg Oral q morning - 10a Arnetha CourserAmin, Sumayya, MD   75 mg at 02/19/18 1321  . dexmedetomidine (PRECEDEX) 400 MCG/100ML (4 mcg/mL) infusion  0.4-1.2 mcg/kg/hr Intravenous Titrated Lynnell CatalanAgarwala, Ravi, MD   Stopped at 02/18/18 1005  . doxazosin (CARDURA) tablet 4 mg  4 mg Oral QPM Amin, Tilman NeatSumayya, MD      . DULoxetine (CYMBALTA) DR capsule 60 mg  60 mg Oral Daily Arnetha CourserAmin, Sumayya, MD   60 mg at 02/19/18 [id number]  . enoxaparin (LOVENOX) injection 115 mg  1 mg/kg Subcutaneous Q12H Agarwala, Ravi, MD      . gabapentin  (NEURONTIN) capsule 600 mg  600 mg Oral BID Arnetha CourserAmin, Sumayya, MD   600 mg at 02/19/18 0925  . hydrALAZINE (APRESOLINE) injection 10 mg  10 mg Intravenous Q6H PRN Larinda ButteryYap, Vanessa, MD   10 mg at 02/18/18 2326  . HYDROmorphone (DILAUDID) injection 1 mg  1 mg Intravenous Q3H PRN Larinda ButteryYap, Vanessa, MD   1 mg at 02/19/18 1151  . insulin aspart (novoLOG) injection 0-9 Units  0-9 Units Subcutaneous Q4H Deterding, Dorise HissElizabeth C, MD   5 Units at 02/19/18 1150  . insulin glargine (LANTUS) injection 20 Units  20 Units Subcutaneous QHS Amin, Tilman NeatSumayya, MD      . labetalol (NORMODYNE,TRANDATE) injection 10 mg  10 mg Intravenous Q2H PRN Larinda ButteryYap, Vanessa, MD   10 mg at 02/19/18 0245  . MEDLINE mouth rinse  15 mL Mouth Rinse BID Coralyn HellingSood, Vineet, MD   15 mL at 02/19/18 0936  . metoprolol succinate (TOPROL-XL) 24 hr tablet 150 mg  150 mg Oral Daily Arnetha CourserAmin, Sumayya, MD   150 mg at 02/19/18 1320  . oxyCODONE (OXYCONTIN) 12 hr tablet 40 mg  40 mg Oral Q12H Larinda ButteryYap, Vanessa, MD   40 mg at 02/19/18 [id number]  . pantoprazole (PROTONIX) injection 40 mg  40 mg Intravenous QHS Minor, Vilinda BlanksWilliam S, NP   40 mg at 02/18/18 2138  . phenylephrine (NEOSYNEPHRINE) 10-0.9 MG/250ML-% infusion  25-200 mcg/min Intravenous Titrated Minor, Vilinda BlanksWilliam S, NP         Discharge Medications: Please see discharge summary for a list of discharge medications.  Relevant Imaging Results:  Relevant Lab Results:   Additional Information SSN: [id number]  Maree KrabbeBridget A Jerrie Schussler, LCSW

## 2018-02-19 NOTE — Progress Notes (Signed)
ANTICOAGULATION CONSULT NOTE  Pharmacy Consult for Heparin Indication: atrial fibrillation  No Known Allergies  Patient Measurements: Heparin Dosing Weight: 97.9kg  Vital Signs: Temp: 98.2 F (36.8 C) (01/19 2330) Temp Source: Oral (01/19 2330) BP: 191/54 (01/20 0100) Pulse Rate: 96 (01/20 0100)  Labs: Recent Labs    02/17/18 1416  02/18/18 0232 02/18/18 0852 02/18/18 1129 02/18/18 1414 02/18/18 2124 02/19/18 0115  HGB 10.6*  --  10.7*  --   --   --   --  11.3*  HCT 35.7*  --  33.7*  --   --   --   --  35.5*  PLT 163  --  179  --   --   --   --  209  APTT >200*  --   --   --   --   --   --   --   LABPROT 15.8*  --   --   --   --   --   --   --   INR 1.28  --   --   --   --   --   --   --   HEPARINUNFRC  --    < > 0.79*  --  0.61  --   --  0.41  CREATININE 3.48*  --  2.81*  --   --   --   --   --   TROPONINI  --   --   --  1.27*  --  1.49* 0.83*  --    < > = values in this interval not displayed.   Estimated Creatinine Clearance: 28.7 mL/min (A) (by C-G formula based on SCr of 2.81 mg/dL (H)).  Assessment: 78 y.o. male with h/o Afib and DVT/PE, subtherapetic INR for heparin  Goal of Therapy:  Heparin level 0.3-0.7 units/ml Monitor platelets by anticoagulation protocol: Yes   Plan:  Continue Heparin at current rate   Geannie Risen, PharmD, BCPS  02/19/2018,1:55 AM

## 2018-02-19 NOTE — Progress Notes (Signed)
eLink Physician-Brief Progress Note Patient Name: Paul Chen DOB: 1940-06-30 MRN: [id number]   Date of Service  02/19/2018  HPI/Events of Note  Pt continues to complain of pain and requested PO home medications.  eICU Interventions  Oxycodone ordered.     Intervention Category Minor Interventions: Routine modifications to care plan (e.g. PRN medications for pain, fever)  Larinda Buttery 02/19/2018, 12:40 AM

## 2018-02-19 NOTE — Progress Notes (Addendum)
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  Patient resting comfortably in bed on Bipap.  Denies any pain in his R foot. C/o of L hip pain that is more problematic than his R foot at this point.  States that he is having a difficult time getting comfortable in bed.  He states that the L hip pain is not new to him. Daughter at bedside   Objective:   VITALS:  Temp:  [97.7 F (36.5 C)-99.1 F (37.3 C)] 98.5 F (36.9 C) (01/20 0731) Pulse Rate:  [44-107] 78 (01/20 0600) Resp:  [11-31] 18 (01/20 0600) BP: (118-191)/(50-116) 161/54 (01/20 0600) SpO2:  [96 %-100 %] 99 % (01/20 0600) FiO2 (%):  [40 %] 40 % (01/19 1600)  General: WDWN patient in NAD. Neuro:  Alert, able to answer questions.  Moving all extremities, sensation diminished to light touch due to underlying DM PN HEENT:  EOMs intact Chest:  Even slightly labored respirations on Bipap Skin:  Dressing C/D/I, no rashes or lesions Extremities: warm/dry, no visible edema, erythema or echymosis.   Rubor and lymphedema of bilateral LE Pulses: Popliteus 2+ MSK:  ROM: able to actively DF/PF remaining lesser toes, MMT: able to perform quad set    LABS Recent Labs    02/17/18 1416 02/18/18 0232 02/19/18 0115  HGB 10.6* 10.7* 11.3*  WBC 10.4 11.0* 15.2*  PLT 163 179 209   Recent Labs    02/17/18 1416 02/18/18 0232  NA 142 143  K 4.0 3.6  CL 105 108  CO2 27 24  BUN 46* 42*  CREATININE 3.48* 2.81*  GLUCOSE 215* 176*   Recent Labs    02/17/18 1416  INR 1.28     Assessment/Plan:     Day 4 S/P R 2nd Toe Amp with Respiratory failure  -Daily dry dressing changes leaving Mepitel layer intact. -WBAT R LE in flat post-op shoe. -Reassured daughter that operative procedure went as planned under sedation without any complications. -Ordered L hip films to evaluate for acute fracture following altered mental status.  If negative and symptoms persist we may consider an MRI.   Alfredo Martinez  PA-C [facility] Office:  [phone]

## 2018-02-19 NOTE — Progress Notes (Signed)
eLink Physician-Brief Progress Note Patient Name: Paul Chen DOB: 1940/04/01 MRN: [id number]   Date of Service  02/19/2018  HPI/Events of Note  Pt remains hypertensive despite hydralazine and pain control with SBP in the 180s.  eICU Interventions  Give labetalol IV. Restart home antihypertensives in the AM.     Intervention Category Intermediate Interventions: Hypertension - evaluation and management  Larinda Buttery 02/19/2018, 2:37 AM

## 2018-02-20 ENCOUNTER — Inpatient Hospital Stay (HOSPITAL_COMMUNITY): Payer: Medicare Other

## 2018-02-20 DIAGNOSIS — E876 Hypokalemia: Secondary | ICD-10-CM

## 2018-02-20 DIAGNOSIS — R0902 Hypoxemia: Secondary | ICD-10-CM

## 2018-02-20 DIAGNOSIS — M25552 Pain in left hip: Secondary | ICD-10-CM

## 2018-02-20 DIAGNOSIS — I480 Paroxysmal atrial fibrillation: Secondary | ICD-10-CM

## 2018-02-20 LAB — CBC
HCT: 35.9 % — ABNORMAL LOW (ref 39.0–52.0)
Hemoglobin: 11.5 g/dL — ABNORMAL LOW (ref 13.0–17.0)
MCH: 29.6 pg (ref 26.0–34.0)
MCHC: 32 g/dL (ref 30.0–36.0)
MCV: 92.3 fL (ref 80.0–100.0)
Platelets: 229 10*3/uL (ref 150–400)
RBC: 3.89 MIL/uL — ABNORMAL LOW (ref 4.22–5.81)
RDW: 15.1 % (ref 11.5–15.5)
WBC: 13.9 10*3/uL — ABNORMAL HIGH (ref 4.0–10.5)
nRBC: 0 % (ref 0.0–0.2)

## 2018-02-20 LAB — GLUCOSE, CAPILLARY
Glucose-Capillary: 162 mg/dL — ABNORMAL HIGH (ref 70–99)
Glucose-Capillary: 167 mg/dL — ABNORMAL HIGH (ref 70–99)
Glucose-Capillary: 207 mg/dL — ABNORMAL HIGH (ref 70–99)
Glucose-Capillary: 248 mg/dL — ABNORMAL HIGH (ref 70–99)
Glucose-Capillary: 262 mg/dL — ABNORMAL HIGH (ref 70–99)
Glucose-Capillary: 272 mg/dL — ABNORMAL HIGH (ref 70–99)

## 2018-02-20 LAB — BASIC METABOLIC PANEL
Anion gap: 10 (ref 5–15)
BUN: 14 mg/dL (ref 8–23)
CO2: 32 mmol/L (ref 22–32)
Calcium: 8.4 mg/dL — ABNORMAL LOW (ref 8.9–10.3)
Chloride: 100 mmol/L (ref 98–111)
Creatinine, Ser: 1.46 mg/dL — ABNORMAL HIGH (ref 0.61–1.24)
GFR calc Af Amer: 54 mL/min — ABNORMAL LOW (ref >60)
GFR calc non Af Amer: 46 mL/min — ABNORMAL LOW (ref >60)
Glucose, Bld: 213 mg/dL — ABNORMAL HIGH (ref 70–99)
Potassium: 3 mmol/L — ABNORMAL LOW (ref 3.5–5.1)
Sodium: 142 mmol/L (ref 135–145)

## 2018-02-20 LAB — PROTIME-INR
INR: 1.25
Prothrombin Time: 15.6 seconds — ABNORMAL HIGH (ref 11.4–15.2)

## 2018-02-20 MED ORDER — POTASSIUM CHLORIDE CRYS ER 20 MEQ PO TBCR
40.0000 meq | EXTENDED_RELEASE_TABLET | Freq: Two times a day (BID) | ORAL | Status: AC
  Administered 2018-02-20 (×2): 40 meq via ORAL
  Filled 2018-02-20 (×2): qty 2

## 2018-02-20 MED ORDER — INSULIN ASPART 100 UNIT/ML ~~LOC~~ SOLN
0.0000 [IU] | Freq: Three times a day (TID) | SUBCUTANEOUS | Status: DC
  Administered 2018-02-20: 7 [IU] via SUBCUTANEOUS
  Administered 2018-02-21: 3 [IU] via SUBCUTANEOUS
  Administered 2018-02-21: 5 [IU] via SUBCUTANEOUS
  Administered 2018-02-21 – 2018-02-22 (×2): 3 [IU] via SUBCUTANEOUS
  Administered 2018-02-22 (×2): 1 [IU] via SUBCUTANEOUS
  Administered 2018-02-23: 2 [IU] via SUBCUTANEOUS

## 2018-02-20 MED ORDER — WARFARIN SODIUM 5 MG PO TABS
5.0000 mg | ORAL_TABLET | Freq: Once | ORAL | Status: AC
  Administered 2018-02-20: 5 mg via ORAL
  Filled 2018-02-20: qty 1

## 2018-02-20 MED ORDER — ENOXAPARIN SODIUM 120 MG/0.8ML ~~LOC~~ SOLN
1.0000 mg/kg | Freq: Two times a day (BID) | SUBCUTANEOUS | Status: DC
  Administered 2018-02-20 – 2018-02-23 (×6): 110 mg via SUBCUTANEOUS
  Filled 2018-02-20 (×6): qty 0.72

## 2018-02-20 NOTE — Progress Notes (Signed)
ANTICOAGULATION CONSULT NOTE  Pharmacy Consult for lovenox + warfarin Indication: atrial fibrillation and hx of DVT and PE   No Known Allergies  Patient Measurements: Heparin Dosing Weight: 97.9kg  Vital Signs: Temp: 98 F (36.7 C) (01/21 1101) Temp Source: Oral (01/21 1101) BP: 155/56 (01/21 0900) Pulse Rate: 69 (01/21 1200)  Labs: Recent Labs    02/17/18 1416  02/18/18 0232 02/18/18 0852 02/18/18 1129 02/18/18 1414 02/18/18 2124 02/19/18 0115 02/19/18 0741 02/20/18 0401  HGB 10.6*  --  10.7*  --   --   --   --  11.3*  --  11.5*  HCT 35.7*  --  33.7*  --   --   --   --  35.5*  --  35.9*  PLT 163  --  179  --   --   --   --  209  --  229  APTT >200*  --   --   --   --   --   --   --   --   --   LABPROT 15.8*  --   --   --   --   --   --   --   --  15.6*  INR 1.28  --   --   --   --   --   --   --   --  1.25  HEPARINUNFRC  --    < > 0.79*  --  0.61  --   --  0.41  --   --   CREATININE 3.48*  --  2.81*  --   --   --   --   --  1.82* 1.46*  TROPONINI  --   --   --  1.27*  --  1.49* 0.83*  --   --   --    < > = values in this interval not displayed.   Estimated Creatinine Clearance: 53.9 mL/min (A) (by C-G formula based on SCr of 1.46 mg/dL (H)).  Assessment: 19 yoM presenting [facility] from OSH after a fall with h/o Afib and PE and DVT on warfarin 2.5 mg daily PTA, recently held for 2nd toe amputation performed 1/16 (INR 1.16), discharged with BID enoxaparin bridge to therapeutic INR. Pt has not been eating well per RN.   Pharmacy consulted to resume Lovenox/warfarin on 1/20. INR subtherapeutic. CBC stable, SCr improved. No active bleed issues documented.  Goal of Therapy:  INR 2-3 Anti-Xa level 0.6-1 units/ml 4hrs after LMWH dose given Monitor platelets by anticoagulation protocol: Yes   Plan:  Reduce enoxaparin to 110 mg (~1 mg/kg) SQ q12h Warfarin 5 mg PO x 1 dose tonight F/U daily INR, CBC at least q72h on Lovenox, and s/sx of bleeding   Babs Bertin, PharmD,  BCPS Please check AMION for all [facility] contact numbers Clinical Pharmacist 02/20/2018 1:09 PM

## 2018-02-20 NOTE — Progress Notes (Signed)
Assessment done and ongoing.  Patient c/o chronic pain 10/10 radiating from left hip.  No external bruising noted and xray showed no broken bones.  Painful to the touch.  Called MD Sommer in the box to see if he could change frequency of Hydromorphone ivp from 3 hrs to 2 hrs.

## 2018-02-20 NOTE — Progress Notes (Signed)
Patient has refused to wear CPAP HS tonight. Patient on [place] tolerating well.

## 2018-02-20 NOTE — Progress Notes (Signed)
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  He was noted to be hypoxic.  He was given morphine which lead to hypotension, for which he was given IV fluids, which led to increasing respiratory distress and the need for noninvasive mechanical ventilatory support. He was transferred to [facility] and found to be hypoxic and hypercarbic with a respiratory acidosis.    Significant Events: 1/18 intubation 1/19 extubation   Subjective: The patient is alert and interactive at the time of my visit.  He denies chest pain or shortness of breath but complains of severe unrelenting left hip pain.  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 - COPD / OSA likely component of obstructive sleep apnea worsened by narcotic use for recent foot surgery -must exercise extreme care with use of sedating medications -monitor and SDU while pain medication being titrated due to high risk for respiratory failure  Parox Atrial fibrillation w/ RVR  NSR at present -anticoagulated with warfarin  CAD with DES to the proximal RCA 2010 EKG noted new onset ST depression in anterolateral leads which were not there on January 16 - Cards feels this is demand ischemia - TTE w/o new WMA -no further inpatient work-up planned  History of PE/DVT On warfarin but INR not at goal therefore overlapping with Lovenox therapy  Hypokalemia  Supplement and follow -check magnesium in a.m.  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 rate 12, height 5\' 10"  (1.778 m), weight 108.3 kg, SpO2 100 %.  Intake/Output Summary (Last 24 hours) at 02/20/2018 1112 Last data filed at 02/20/2018 0600 Gross per 24 hour  Intake 69.3 ml  Output 2525 ml  Net -2455.7 ml   Filed Weights   02/20/18 0431  Weight: 108.3 kg    Examination: General: No acute respiratory distress Lungs: Clear to auscultation bilaterally without wheezes or crackles Cardiovascular: Regular rate and rhythm without murmur gallop or rub normal S1 and S2 Abdomen: Nontender, nondistended, soft, bowel sounds positive, no rebound, no ascites, no appreciable mass Extremities: R foot dressed and dry - trace B LE edema   CBC: Recent Labs  Lab 02/18/18 0232 02/19/18 0115 02/20/18 0401  WBC 11.0* 15.2* 13.9*  HGB 10.7* 11.3* 11.5*  HCT 33.7* 35.5* 35.9*  MCV 94.4 93.9 92.3  PLT 179 209 229   Basic Metabolic Panel: Recent Labs  Lab 02/17/18 1416 02/18/18 0232 02/19/18 0741 02/20/18 0401  NA 142 143 142 142  K 4.0 3.6 3.5 3.0*  CL 105 108 101 100  CO2 27 24 24  32  GLUCOSE 215* 176* 218* 213*  BUN 46* 42* 22 14  CREATININE 3.48* 2.81* 1.82* 1.46*  CALCIUM 7.5* 8.0* 8.2* 8.4*  MG 2.5*  --   --   --   PHOS 5.0*  --  3.1  --    GFR: Estimated Creatinine Clearance: 53.9 mL/min (A) (by C-G formula based on SCr of 1.46 mg/dL (H)).  Liver Function Tests: Recent  Labs  Lab 02/17/18 1416 02/19/18 0741  AST 148*  --   ALT 104*  --   ALKPHOS 56  --   BILITOT 1.0  --   PROT 6.0*  --   ALBUMIN 3.0* 3.0*   Recent Labs  Lab 02/17/18 1416  LIPASE 20  AMYLASE 13*    Coagulation Profile: Recent Labs  Lab 02/15/18 0749 02/17/18 1416 02/20/18 0401  INR 1.16 1.28 1.25    Cardiac Enzymes: Recent Labs  Lab 02/18/18 0852 02/18/18 1414 02/18/18 2124  TROPONINI 1.27* 1.49* 0.83*    HbA1C: Hgb A1c MFr Bld  Date/Time Value Ref Range Status  08/09/2017 02:02 PM 6.6 (H) 4.8 - 5.6 % Final    Comment:             Prediabetes: 5.7 - 6.4          Diabetes: >6.4          Glycemic control for adults with diabetes: <7.0     CBG: Recent Labs  Lab 02/19/18 1511 02/19/18 1953 02/19/18 2317 02/20/18 0347 02/20/18 0744  GLUCAP 267* 199* 234* 207* 162*    Recent Results (from the past 240 hour(s))  MRSA PCR Screening     Status: None   Collection Time: 02/17/18  1:03 PM  Result Value Ref Range Status   MRSA by PCR NEGATIVE NEGATIVE Final    Comment:        The GeneXpert MRSA Assay (FDA approved for NASAL specimens only), is one component of a comprehensive MRSA colonization surveillance program. It is not intended to diagnose MRSA infection nor to guide or monitor treatment for MRSA infections. Performed at [facility], [address], [place], [place] [address]   Urine culture     Status: None   Collection Time: 02/17/18  1:15 PM  Result Value Ref Range Status   Specimen Description URINE, RANDOM  Final   Special Requests NONE  Final   Culture   Final    NO GROWTH Performed at [facility], [address], [place], [place] [address]    Report Status 02/18/2018 FINAL  Final  Culture, blood (routine x 2)     Status: None (Preliminary result)   Collection Time: 02/17/18  2:20 PM  Result Value Ref Range Status   Specimen Description BLOOD RIGHT ANTECUBITAL  Final   Special Requests   Final    BOTTLES DRAWN  AEROBIC ONLY Blood Culture adequate volume   Culture   Final    NO GROWTH 2 DAYS Performed at [facility], [address] [place], [place] [address]    Report Status PENDING  Incomplete  Culture, blood (routine x 2)     Status: None (Preliminary result)   Collection Time: 02/17/18  2:26 PM  Result Value Ref Range Status   Specimen Description BLOOD RIGHT HAND  Final  Special Requests   Final    BOTTLES DRAWN AEROBIC ONLY Blood Culture adequate volume   Culture   Final    NO GROWTH 2 DAYS Performed at [facility], [address], [place], [place] [address]    Report Status PENDING  Incomplete     Scheduled Meds: . allopurinol  300 mg Oral Daily  . amLODipine  10 mg Oral Daily  . atorvastatin  40 mg Oral Daily  . busPIRone  15 mg Oral BID  . clopidogrel  75 mg Oral q morning - 10a  . doxazosin  4 mg Oral QPM  . DULoxetine  60 mg Oral Daily  . enoxaparin (LOVENOX) injection  1 mg/kg Subcutaneous Q12H  . gabapentin  600 mg Oral BID  . insulin aspart  0-9 Units Subcutaneous Q4H  . insulin glargine  20 Units Subcutaneous QHS  . mouth rinse  15 mL Mouth Rinse BID  . metoprolol succinate  150 mg Oral Daily  . oxyCODONE  40 mg Oral Q12H  . pantoprazole (PROTONIX) IV  40 mg Intravenous QHS  . potassium chloride  40 mEq Oral BID AC  . Warfarin - Pharmacist Dosing Inpatient   Does not apply q1800     LOS: 3 days   Lonia Blood, MD [facility] Office  [phone] Pager - Text Page per Amion  If 7PM-7AM, please contact night-coverage per Amion 02/20/2018, 11:12 AM

## 2018-02-20 NOTE — Progress Notes (Signed)
Progress Note  Patient Name: Paul Chen Date of Encounter: 02/20/2018  Primary Cardiologist: Stephanye Finnicum Swazil[place], MD   Subjective   Continues to have significant left hip pain. Very sedated this am on narcotics.    Inpatient Medications    Scheduled Meds: . allopurinol  300 mg Oral Daily  . amLODipine  10 mg Oral Daily  . atorvastatin  40 mg Oral Daily  . busPIRone  15 mg Oral BID  . clopidogrel  75 mg Oral q morning - 10a  . doxazosin  4 mg Oral QPM  . DULoxetine  60 mg Oral Daily  . enoxaparin (LOVENOX) injection  1 mg/kg Subcutaneous Q12H  . gabapentin  600 mg Oral BID  . insulin aspart  0-9 Units Subcutaneous Q4H  . insulin glargine  20 Units Subcutaneous QHS  . mouth rinse  15 mL Mouth Rinse BID  . metoprolol succinate  150 mg Oral Daily  . oxyCODONE  40 mg Oral Q12H  . pantoprazole (PROTONIX) IV  40 mg Intravenous QHS  . potassium chloride  40 mEq Oral BID AC  . Warfarin - Pharmacist Dosing Inpatient   Does not apply q1800   Continuous Infusions: . sodium chloride Stopped (02/19/18 1619)  . dexmedetomidine (PRECEDEX) IV infusion Stopped (02/18/18 1005)  . phenylephrine (NEO-SYNEPHRINE) Adult infusion     PRN Meds: hydrALAZINE, HYDROmorphone (DILAUDID) injection, labetalol   Vital Signs    Vitals:   02/20/18 0431 02/20/18 0500 02/20/18 0600 02/20/18 0700  BP:  (!) 165/81 (!) 158/73 (!) 138/56  Pulse:  (!) 131 65 (!) 57  Resp:  11 13 10   Temp:    97.8 F (36.6 C)  TempSrc:    Oral  SpO2:  98% 98% 100%  Weight: 108.3 kg     Height:        Intake/Output Summary (Last 24 hours) at 02/20/2018 0853 Last data filed at 02/20/2018 0600 Gross per 24 hour  Intake 146.6 ml  Output 3095 ml  Net -2948.4 ml   Filed Weights   02/20/18 0431  Weight: 108.3 kg    Physical Exam   General: Well developed, obese, difficult to arouse due to sedation Skin: Warm, dry, intact  Head: Normocephalic, atraumatic, clear, moist mucus membranes. Neck: Negative for  carotid bruits. No JVD Lungs:Clear to ausculation bilaterally. No wheezes, rales, or rhonchi. Breathing is unlabored. Cardiovascular: RRR with S1 S2. No murmurs, rubs, gallops, or LV heave appreciated. Abdomen: Soft, non-tender, non-distended with normoactive bowel sounds. No hepatomegaly, No rebound/guarding. No obvious abdominal masses. MSK: Strength and tone appear normal for age. 5/5 in all extremities Extremities: Tr edema. No clubbing or cyanosis. DP/PT pulses 1+ right. Wrap to left LE  Neuro: Alert and oriented to person and place. No focal deficits. No facial asymmetry. MAE spontaneously. Psych: Responds to questions somewhat appropriately with normal affect.    Labs    Chemistry Recent Labs  Lab 02/17/18 1416 02/18/18 0232 02/19/18 0741 02/20/18 0401  NA 142 143 142 142  K 4.0 3.6 3.5 3.0*  CL 105 108 101 100  CO2 27 24 24  32  GLUCOSE 215* 176* 218* 213*  BUN 46* 42* 22 14  CREATININE 3.48* 2.81* 1.82* 1.46*  CALCIUM 7.5* 8.0* 8.2* 8.4*  PROT 6.0*  --   --   --   ALBUMIN 3.0*  --  3.0*  --   AST 148*  --   --   --   ALT 104*  --   --   --  ALKPHOS 56  --   --   --   BILITOT 1.0  --   --   --   GFRNONAA 16* 21* 36* 46*  GFRAA 19* 24* 41* 54*  ANIONGAP 10 11 17* 10     Hematology Recent Labs  Lab 02/18/18 0232 02/19/18 0115 02/20/18 0401  WBC 11.0* 15.2* 13.9*  RBC 3.57* 3.78* 3.89*  HGB 10.7* 11.3* 11.5*  HCT 33.7* 35.5* 35.9*  MCV 94.4 93.9 92.3  MCH 30.0 29.9 29.6  MCHC 31.8 31.8 32.0  RDW 14.9 15.2 15.1  PLT 179 209 229    Cardiac Enzymes Recent Labs  Lab 02/18/18 0852 02/18/18 1414 02/18/18 2124  TROPONINI 1.27* 1.49* 0.83*   No results for input(s): TROPIPOC in the last 168 hours.   BNPNo results for input(s): BNP, PROBNP in the last 168 hours.   DDimer No results for input(s): DDIMER in the last 168 hours.   Radiology    Dg Chest Port 1 View  Result Date: 02/20/2018 CLINICAL DATA:  Hypoxia EXAM: PORTABLE CHEST 1 VIEW COMPARISON:   Three days ago FINDINGS: Interval extubation. Low volumes with indistinct opacities at the bases, likely atelectasis. Cardiomegaly accentuated by low volumes. No effusion or pneumothorax. IMPRESSION: Lower volumes after extubation with increased atelectasis. Electronically Signed   By: Marnee Spring M.D.   On: 02/20/2018 06:31   Dg Hip Unilat With Pelvis 2-3 Views Left  Result Date: 02/19/2018 CLINICAL DATA:  Left hip pain laterally.  Fall in the last week. EXAM: DG HIP (WITH OR WITHOUT PELVIS) 2-3V LEFT COMPARISON:  None. FINDINGS: There mild symmetric degenerative changes of the hips. No acute fracture or dislocation. Degenerative change of the spine with fusion hardware present at the L5-S1 level as well as partially visualized over the mid lumbar spine. Stent over the right femoral artery. IMPRESSION: No acute findings. 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 Electronically Signed   By: Elberta Fortis M.D.   On: 02/19/2018 16:53   Telemetry    Sinus brady with rate 57. Was in Afib with RVR for 4 hours this am. Rate up to 140. - Personally Reviewed  ECG    No new tracing  - Cardiac Studies   Echo 02/18/2018 LV EF: 60% - 65% Study Conclusions  - Left ventricle: The cavity size was normal. Wall thickness was normal. Systolic function was normal. The estimated ejection fraction was in the range of 60% to 65%. Wall motion was normal; there were no regional wall motion abnormalities. Doppler parameters are consistent with abnormal left ventricular relaxation (grade 1 diastolic dysfunction).  Patient Profile     78 y.o. male with a hx of CAD s/p DES x3 to proximal to mid RCA in 2010 with repeat cardiac catheterization in 2011 with patent stents, COPD, IDDM, PAD, history of DVT and PE in 2010, moderate to severe pulmonary hypertension, obstructive sleep apnea, CKD stage III and morbid obesity who is being seen today for the evaluation of elevated trop while intubated  for respiratory distress at the request of Dr. Craige Cotta  Assessment & Plan    1. Demand ischemia: -Pt with post-operative respiratory distress and AMS who presented to [facility] and subsequently transferred to [facility] for further workup  -Echocardiogram with normal LV function  -Trop 1.27>1.49>0.83 without acute ACS symptoms>>likely in the setting of acute respiratory distress, hypotension, AKI, Afib  -No  chest pain -No indication for cardiac cath at this time. I don't anticipate any further cardiac evaluation in hospital.  Could consider noninvasive stress testing later as outpatient depending on clinical status.   2. Acute respiratory distress s/p intubation: -Managed per PCCM  -Initally presented to [facility] with AMS and became hypotension after the initiation of morphine >>received fluid resuscitation and was intubated for acute respiratory failure -Currently extubated   3. Acute on chronic renal insufficieny: -Creatinine on presentation 3.48>2.81>1.82> 1.46.  -Baseline appears to be in the 1.3 range  -Elevation likely in the setting of severe hypotension  -Continue to avoid nephrotoxic medications  4. Paroxysmal AF with RVR: -Since hospitalized he has paroxysmal Afib/flutter with RVR. Not symptomatic.  -On chronic Coumadin for recurrent PE and coumadin resumed.  - on rate control with metoprolol 150 mg daily. HR 57 in NSR.  - Afib related to multiple medical stressors.  - if he has sustained or symptomatic Afib will need to consider putting him on amiodarone.   5. DVT/PE: -On chronic Coumadin of recurrent PE  6. CAD s/p PTCA/DES: -Denies recurrent chest pain  -On monotherapy with Plavix (stent in the RCA 2010, patent in 2011) and Coumadin (for DVT/PE) -Echocardiogram with normal LV function  -No ischemic workup with cath at this time however would consider   7. Left hip pain: -Continues to have left sided hip pain -Xray without fracture.  - ortho has ordered an  MRI.    Signed, Lenka Zhao [place], MD  For questions or updates, please contact   Please consult [email or web address] for contact info under Cardiology/STEMI.

## 2018-02-20 NOTE — Progress Notes (Signed)
Giving chg bath noticed upper chest flush and +2 pitting edema.  Face flush and warm to the touch.  Contacted MD Summer for chest xray.  Upper lobes clear while lower lobes both anterior and posterior had absence of breath sounds.    Upon further investigation, most likely culprit for the flash would be the hydromorphone IVP given 30 minutes earlier.  Cannot explain extra edema at this time.  vss remain stable and patient states he is not having a problem breathing.  Continuing to closely monitor

## 2018-02-20 NOTE — Progress Notes (Signed)
Pt came to MRI and could not lay on our table due to pain. Pt would jerk legs in pain RN aware and pt sent back to his room.

## 2018-02-20 NOTE — Progress Notes (Signed)
Subjective:     Nurse reports that patient continues to c/o of intense L hip pain.  Radiographs negative for acute fracture.  Patient is requiring Morphine on an hourly bases.  Unable to rouse patient this morning, likely due to narcotics.  Nurse also reports 3+ pitting edema of torso and flanks.  Objective:   VITALS:  Temp:  [98.2 F (36.8 C)-98.8 F (37.1 C)] 98.8 F (37.1 C) (01/21 0300) Pulse Rate:  [48-136] 65 (01/21 0600) Resp:  [11-25] 13 (01/21 0600) BP: (126-185)/(49-90) 158/73 (01/21 0600) SpO2:  [96 %-100 %] 98 % (01/21 0600) Weight:  [108.3 kg] 108.3 kg (01/21 0431)  General: WDWN patient in NAD. Psych:  Unable to assess this morning as I could not rouse patient. Chest:  Even non-labored respirations on O2 via nasal canula. Skin:  Dressing C/D/I, no rashes or lesions Extremities: warm/dry, no visible edema, erythema or echymosis.  Bilateral lymphedema and rubor of LE. Pulses: Popliteus 2+ MSK:  Unable to assess.  Patient did move toes while sleeping this morning.    LABS Recent Labs    02/17/18 1416 02/18/18 0232 02/19/18 0115 02/20/18 0401  HGB 10.6* 10.7* 11.3* 11.5*  WBC 10.4 11.0* 15.2* 13.9*  PLT 163 179 209 229   Recent Labs    02/19/18 0741 02/20/18 0401  NA 142 142  K 3.5 3.0*  CL 101 100  CO2 24 32  BUN 22 14  CREATININE 1.82* 1.46*  GLUCOSE 218* 213*   Recent Labs    02/17/18 1416 02/20/18 0401  INR 1.28 1.25     Assessment/Plan:    Day 5 S/P R 2nd toe Amp complicated AMS following after D/C from surgery  -MRI L hip ordered -Daily dry dressing changes leaving Mepitel layer intact. -WBAT R LE in flat post-op shoe. -Will defer treatment of pitting edema in torso and flanks to CCM team.    Alfredo Martinez PA-C [facility] Office:  [phone]

## 2018-02-20 NOTE — Progress Notes (Signed)
eLink Physician-Brief Progress Note Patient Name: Paul Chen DOB: [birth year] MRN: [id number]   Date of Service  02/20/2018  HPI/Events of Note  Nurse concerned about chest wall edema and possible crepitus.   eICU Interventions  Will order: 1. Portable CXR STAT.      Intervention Category Major Interventions: Other:  Lenell Antu 02/20/2018, 5:49 AM

## 2018-02-21 DIAGNOSIS — I2510 Atherosclerotic heart disease of native coronary artery without angina pectoris: Secondary | ICD-10-CM

## 2018-02-21 LAB — BASIC METABOLIC PANEL
Anion gap: 9 (ref 5–15)
BUN: 14 mg/dL (ref 8–23)
CO2: 31 mmol/L (ref 22–32)
Calcium: 8.5 mg/dL — ABNORMAL LOW (ref 8.9–10.3)
Chloride: 100 mmol/L (ref 98–111)
Creatinine, Ser: 1.45 mg/dL — ABNORMAL HIGH (ref 0.61–1.24)
GFR calc Af Amer: 54 mL/min — ABNORMAL LOW (ref >60)
GFR calc non Af Amer: 47 mL/min — ABNORMAL LOW (ref >60)
Glucose, Bld: 241 mg/dL — ABNORMAL HIGH (ref 70–99)
Potassium: 3.5 mmol/L (ref 3.5–5.1)
Sodium: 140 mmol/L (ref 135–145)

## 2018-02-21 LAB — GLUCOSE, CAPILLARY
Glucose-Capillary: 210 mg/dL — ABNORMAL HIGH (ref 70–99)
Glucose-Capillary: 209 mg/dL — ABNORMAL HIGH (ref 70–99)
Glucose-Capillary: 254 mg/dL — ABNORMAL HIGH (ref 70–99)
Glucose-Capillary: 268 mg/dL — ABNORMAL HIGH (ref 70–99)

## 2018-02-21 LAB — PROTIME-INR
INR: 1.31
Prothrombin Time: 16.2 seconds — ABNORMAL HIGH (ref 11.4–15.2)

## 2018-02-21 LAB — MAGNESIUM: Magnesium: 1.7 mg/dL (ref 1.7–2.4)

## 2018-02-21 MED ORDER — POTASSIUM CHLORIDE 20 MEQ PO PACK
20.0000 meq | PACK | Freq: Once | ORAL | Status: AC
  Administered 2018-02-21: 20 meq via ORAL
  Filled 2018-02-21: qty 1

## 2018-02-21 MED ORDER — MAGNESIUM SULFATE IN D5W 1-5 GM/100ML-% IV SOLN
1.0000 g | Freq: Once | INTRAVENOUS | Status: AC
  Administered 2018-02-21: 1 g via INTRAVENOUS
  Filled 2018-02-21: qty 100

## 2018-02-21 MED ORDER — INSULIN GLARGINE 100 UNIT/ML ~~LOC~~ SOLN
25.0000 [IU] | Freq: Every day | SUBCUTANEOUS | Status: DC
  Administered 2018-02-21 – 2018-02-22 (×2): 25 [IU] via SUBCUTANEOUS
  Filled 2018-02-21 (×4): qty 0.25

## 2018-02-21 MED ORDER — WARFARIN SODIUM 5 MG PO TABS
5.0000 mg | ORAL_TABLET | Freq: Once | ORAL | Status: AC
  Administered 2018-02-21: 5 mg via ORAL
  Filled 2018-02-21: qty 1

## 2018-02-21 NOTE — Progress Notes (Signed)
ANTICOAGULATION CONSULT NOTE  Pharmacy Consult for lovenox + warfarin Indication: atrial fibrillation and hx of DVT and PE   No Known Allergies  Vital Signs: Temp: 98.4 F (36.9 C) (01/22 1135) Temp Source: Oral (01/22 1135) BP: 144/59 (01/22 0915) Pulse Rate: 73 (01/22 0915)  Labs: Recent Labs    02/18/18 1414 02/18/18 2124 02/19/18 0115 02/19/18 0741 02/20/18 0401 02/21/18 0604  HGB  --   --  11.3*  --  11.5*  --   HCT  --   --  35.5*  --  35.9*  --   PLT  --   --  209  --  229  --   LABPROT  --   --   --   --  15.6* 16.2*  INR  --   --   --   --  1.25 1.31  HEPARINUNFRC  --   --  0.41  --   --   --   CREATININE  --   --   --  1.82* 1.46* 1.45*  TROPONINI 1.49* 0.83*  --   --   --   --    Estimated Creatinine Clearance: 54.8 mL/min (A) (by C-G formula based on SCr of 1.45 mg/dL (H)).  Assessment: 80 yoM [facility] OSH after a fall with h/o Afib and PE and DVT on warfarin 2.5 mg daily PTA, recently held for 2nd toe amputation performed 1/16 (INR 1.16), discharged with BID enoxaparin bridge to therapeutic INR. Pt has not been eating well per RN.   Pharmacy consulted to resume Lovenox/warfarin on 1/20. INR subtherapeutic - stable. CBC stable, SCr improved. No active bleed issues documented. Weight ~110 kg.  Goal of Therapy:  INR 2-3 Anti-Xa level 0.6-1 units/ml 4hrs after LMWH dose given Monitor platelets by anticoagulation protocol: Yes   Plan:  Enoxaparin 110 mg (~1 mg/kg) SQ q12h Warfarin 5 mg PO x 1 dose again tonight F/U daily INR, CBC at least q72h on Lovenox, and s/sx of bleeding   Babs Bertin, PharmD, BCPS Please check AMION for all [facility] contact numbers Clinical Pharmacist 02/21/2018 12:35 PM

## 2018-02-21 NOTE — Progress Notes (Signed)
Progress Note  Patient Name: Paul RockersDanny L Clayborn Date of Encounter: 02/21/2018  Primary Cardiologist: Littleton Haub [place], MD   Subjective   Continues to have significant left hip pain. Unable to complete MRI  Inpatient Medications    Scheduled Meds: . allopurinol  300 mg Oral Daily  . amLODipine  10 mg Oral Daily  . atorvastatin  40 mg Oral Daily  . busPIRone  15 mg Oral BID  . clopidogrel  75 mg Oral q morning - 10a  . doxazosin  4 mg Oral QPM  . DULoxetine  60 mg Oral Daily  . enoxaparin (LOVENOX) injection  1 mg/kg Subcutaneous Q12H  . gabapentin  600 mg Oral BID  . insulin aspart  0-9 Units Subcutaneous TID WC  . insulin glargine  20 Units Subcutaneous QHS  . mouth rinse  15 mL Mouth Rinse BID  . metoprolol succinate  150 mg Oral Daily  . oxyCODONE  40 mg Oral Q12H  . Warfarin - Pharmacist Dosing Inpatient   Does not apply q1800   Continuous Infusions: . sodium chloride Stopped (02/19/18 1619)   PRN Meds: hydrALAZINE, HYDROmorphone (DILAUDID) injection, labetalol   Vital Signs    Vitals:   02/21/18 0630 02/21/18 0700 02/21/18 0742 02/21/18 0800  BP: (!) 156/63 (!) 161/62  (!) 157/55  Pulse: 68 60  65  Resp: 12 15  16   Temp:   98.2 F (36.8 C)   TempSrc:   Oral   SpO2: 100% 100%  97%  Weight:      Height:        Intake/Output Summary (Last 24 hours) at 02/21/2018 0827 Last data filed at 02/21/2018 0600 Gross per 24 hour  Intake 500 ml  Output 1700 ml  Net -1200 ml   Filed Weights   02/20/18 0431 02/21/18 0500  Weight: 108.3 kg 110.5 kg    Physical Exam   General: Well developed, obese WM  Skin: Warm, dry, intact  Head: Normocephalic, atraumatic, clear, moist mucus membranes. Neck: Negative for carotid bruits. No JVD Lungs:Clear to ausculation bilaterally. No wheezes, rales, or rhonchi. Breathing is unlabored. Cardiovascular: RRR with S1 S2. No murmurs, rubs, gallops, or LV heave appreciated. Abdomen: Soft, non-tender, non-distended with normoactive  bowel sounds. No hepatomegaly, No rebound/guarding. No obvious abdominal masses. MSK: Strength and tone appear normal for age. 5/5 in all extremities Extremities: Tr edema. No clubbing or cyanosis. DP/PT pulses 1+ right. Wrap to left LE  Neuro: Alert and oriented to person and place. No focal deficits. No facial asymmetry. MAE spontaneously. Psych: Responds to questions somewhat appropriately with normal affect.    Labs    Chemistry Recent Labs  Lab 02/17/18 1416  02/19/18 0741 02/20/18 0401 02/21/18 0604  NA 142   < > 142 142 140  K 4.0   < > 3.5 3.0* 3.5  CL 105   < > 101 100 100  CO2 27   < > 24 32 31  GLUCOSE 215*   < > 218* 213* 241*  BUN 46*   < > 22 14 14   CREATININE 3.48*   < > 1.82* 1.46* 1.45*  CALCIUM 7.5*   < > 8.2* 8.4* 8.5*  PROT 6.0*  --   --   --   --   ALBUMIN 3.0*  --  3.0*  --   --   AST 148*  --   --   --   --   ALT 104*  --   --   --   --  ALKPHOS 56  --   --   --   --   BILITOT 1.0  --   --   --   --   GFRNONAA 16*   < > 36* 46* 47*  GFRAA 19*   < > 41* 54* 54*  ANIONGAP 10   < > 17* 10 9   < > = values in this interval not displayed.     Hematology Recent Labs  Lab 02/18/18 0232 02/19/18 0115 02/20/18 0401  WBC 11.0* 15.2* 13.9*  RBC 3.57* 3.78* 3.89*  HGB 10.7* 11.3* 11.5*  HCT 33.7* 35.5* 35.9*  MCV 94.4 93.9 92.3  MCH 30.0 29.9 29.6  MCHC 31.8 31.8 32.0  RDW 14.9 15.2 15.1  PLT 179 209 229    Cardiac Enzymes Recent Labs  Lab 02/18/18 0852 02/18/18 1414 02/18/18 2124  TROPONINI 1.27* 1.49* 0.83*   No results for input(s): TROPIPOC in the last 168 hours.   BNPNo results for input(s): BNP, PROBNP in the last 168 hours.   DDimer No results for input(s): DDIMER in the last 168 hours.   Radiology    Dg Chest Port 1 View  Result Date: 02/20/2018 CLINICAL DATA:  Hypoxia EXAM: PORTABLE CHEST 1 VIEW COMPARISON:  Three days ago FINDINGS: Interval extubation. Low volumes with indistinct opacities at the bases, likely atelectasis.  Cardiomegaly accentuated by low volumes. No effusion or pneumothorax. IMPRESSION: Lower volumes after extubation with increased atelectasis. Electronically Signed   By: Marnee Spring M.D.   On: 02/20/2018 06:31   Dg Hip Unilat With Pelvis 2-3 Views Left  Result Date: 02/19/2018 CLINICAL DATA:  Left hip pain laterally.  Fall in the last week. EXAM: DG HIP (WITH OR WITHOUT PELVIS) 2-3V LEFT COMPARISON:  None. FINDINGS: There mild symmetric degenerative changes of the hips. No acute fracture or dislocation. Degenerative change of the spine with fusion hardware present at the L5-S1 level as well as partially visualized over the mid lumbar spine. Stent over the right femoral artery. IMPRESSION: No acute findings. 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 Electronically Signed   By: Elberta Fortis M.D.   On: 02/19/2018 16:53   Telemetry    NSR. No Afib in > 24 hours - Personally Reviewed  ECG    No new tracing  - Cardiac Studies   Echo 02/18/2018 LV EF: 60% - 65% Study Conclusions  - Left ventricle: The cavity size was normal. Wall thickness was normal. Systolic function was normal. The estimated ejection fraction was in the range of 60% to 65%. Wall motion was normal; there were no regional wall motion abnormalities. Doppler parameters are consistent with abnormal left ventricular relaxation (grade 1 diastolic dysfunction).  Patient Profile     78 y.o. male with a hx of CAD s/p DES x3 to proximal to mid RCA in 2010 with repeat cardiac catheterization in 2011 with patent stents, COPD, IDDM, PAD, history of DVT and PE in 2010, moderate to severe pulmonary hypertension, obstructive sleep apnea, CKD stage III and morbid obesity who is being seen today for the evaluation of elevated trop while intubated for respiratory distress at the request of Dr. Craige Cotta  Assessment & Plan    1. Demand ischemia: -Pt with post-operative respiratory distress and AMS who presented to [facility] and subsequently transferred to [facility] for further workup  -Echocardiogram with normal LV function  -Trop 1.27>1.49>0.83 without acute ACS symptoms>>likely in the setting of acute respiratory distress, hypotension, AKI, Afib  -No  chest pain -  No indication for cardiac cath at this time. I don't anticipate any further cardiac evaluation in hospital. Could consider noninvasive stress testing later as outpatient depending on clinical status.   2. Acute respiratory distress s/p intubation: -Managed per PCCM  -[facility]ally [facility] with [facility]MS and became hypotension after the initiation of morphine >>received fluid resuscitation and was intubated for acute respiratory failure - no respiratory distress.   3. Acute on chronic renal insufficieny: -Creatinine on presentation 3.48>2.81>1.82> 1.46.  -Baseline appears to be in the 1.3 range  -Elevation likely in the setting of severe hypotension  -Continue to avoid nephrotoxic medications  4. Paroxysmal AF with RVR: -Since hospitalized he has paroxysmal Afib/flutter with RVR. Not symptomatic.  -On chronic Coumadin for recurrent PE and coumadin resumed.  - on rate control with metoprolol 150 mg daily. No Afib in > 24 hours - Afib related to multiple medical stressors.  - if he has sustained or symptomatic Afib will need to consider putting him on amiodarone.   5. DVT/PE: -On chronic Coumadin of recurrent PE  6. CAD s/p PTCA/DES: -Denies recurrent chest pain  -On monotherapy with Plavix (stent in the RCA 2010, patent in 2011) and Coumadin (for DVT/PE) -Echocardiogram with normal LV function  -No ischemic workup with cath at this time however would consider   7. Left hip pain: -Continues to have left sided hip pain -Xray without fracture.  - per ortho  [facility] will sign off.   Med[facility] Recommendations:  [facility] Other recommendations (labs, testing, etc):  none Follow up as an outpatient:  Follow up 2-3 weeks  post hospitalization.     Signed, Alzora Ha [place], MD  For questions or updates, please contact   Please consult [email or web address] for contact info under Cardiology/STEMI.

## 2018-02-21 NOTE — Progress Notes (Addendum)
Occupational Therapy Treatment Patient Details Name: Paul Chen MRN: [id number] DOB: August 28, 1940 Today's Date: 02/21/2018    History of present illness 78 yo male s/p R great toe amputation ( WBAT in postop shoe) 2 days prior to presentation to hospital for respiratory failure on 1/18; Extubated 1/19;  Extreme difficulty getting comfortable, limited by pain form long-standing L hip arthritis. PMH including Arthritis, Chronic renal insufficiency, CKD stage III, COPD, Coronary disease (October 2010), Depression, Diabetes mellitus, Diastolic dysfunction, Dyspnea, Gout, History of blood transfusion, History of diabetic ulcer of foot, HTN, Hypoventilation, Obesity, Pneumonia, Pulmonary embolism (March 2010), and Sleep apnea.   OT comments  Pt progressing towards established OT goals. Pt performing functional mobility towards sink with Mod A +2 and RW. Requiring Max cues for weight shifting side to side and sequencing steps with RW management. Pt performing oral care while seated at sink. VSS and RN present throughout. Pt presenting with decreased cognition during session as seen by poor problem solving and awareness. Pt requiring simple, direct cues and increased time. Continue to recommend dc to SNF and will continue to follow acutely as admitted.   Of note. Pt's daughter reporting he is not at baseline cognition. Pt reporting incorrect information such as "having tremors for only two years" - his daughter stating it has been much longer. Pt also with difficulty self feeding and overshooting to the left with his RUE; pt sliding spoon along his cheek to locate his mouth. Reported cognitive deficits to RN.    Follow Up Recommendations  SNF;Supervision/Assistance - 24 hour    Equipment Recommendations  Other (comment)(Defer to next venue)    Recommendations for Other Services PT consult    Precautions / Restrictions Precautions Precautions: Fall Required Braces or Orthoses: Other Brace Other  Brace: Post op shoe; WBAT in postop shoe per Ortho Consult note Restrictions Weight Bearing Restrictions: No RLE Weight Bearing: Weight bearing as tolerated Other Position/Activity Restrictions: post-op shoe       Mobility Bed Mobility               General bed mobility comments: Pt in the recliner upon arrival  Transfers Overall transfer level: Needs assistance Equipment used: Rolling walker (2 wheeled) Transfers: Sit to/from Stand Sit to Stand: Mod assist;+2 physical assistance;Min assist;+2 safety/equipment         General transfer comment: Max cues for weight shift and hand placement. Mod A +2 to power up on first sit<>stand. Min A +2 for power up at second sit<>stand. Both from recliner    Balance Overall balance assessment: Needs assistance Sitting-balance support: Bilateral upper extremity supported;Feet supported Sitting balance-Leahy Scale: Fair     Standing balance support: Bilateral upper extremity supported;During functional activity Standing balance-Leahy Scale: Poor Standing balance comment: Reliant on UE support                           ADL either performed or assessed with clinical judgement   ADL Overall ADL's : Needs assistance/impaired Eating/Feeding: Supervision/ safety;Set up;Sitting Eating/Feeding Details (indicate cue type and reason): While eating apple sauce, pt overshooting to left in bringing spoon to his mouth. Pt using his RUE and bringing spoon to left side of mouth (1-2 inches away from mouth) and then sliding spoon towards his mouth.  Grooming: Oral care;Min guard;Sitting Grooming Details (indicate cue type and reason): Pt with decreased grasp strength and FM skills dropping oral care items when holding them. Pt reports he has had tremors  for two years (his daughter reports it has been much longer). Pt using significant amount of time for performance of oral care.                 Toilet Transfer: Moderate  assistance;Ambulation;RW(simulated to recliner) Toilet Transfer Details (indicate cue type and reason): Pt requiring Min-Mod A +2 for power up into standing. Pt then requiring Mod A +2 for mobility forward with chair following. Pt requiring Mod A +2 for safe descent         Functional mobility during ADLs: Moderate assistance;+2 for physical assistance;Rolling walker;Cueing for sequencing(Requiring Max cues for weight shift and sequencing with RW) General ADL Comments: Pt performing functional mobility towards sink. Pt requiring Mod A +2 and RW for mobility and taking ~10 steps then requiring seated rest break. Pt then taking 6 more steps. Pt then completed oral care while seated at the sink.      Vision       Perception     Praxis      Cognition Arousal/Alertness: Awake/alert Behavior During Therapy: WFL for tasks assessed/performed Overall Cognitive Status: Impaired/Different from baseline Area of Impairment: Attention;Following commands;Awareness;Problem solving                   Current Attention Level: Sustained   Following Commands: Follows one step commands with increased time;Follows multi-step commands inconsistently   Awareness: Emergent Problem Solving: Slow processing;Requires verbal cues General Comments: Pt requiring simple direct cues. Pt requiring increased cues and time during session. Noting decreased problem solving during grooming and mobility. Pt 's daughter reports he has been having hallucinations and has been havign quick changes in emotions. Pt able to state he is at [facility] but states it is because of his left hip.        Exercises     Shoulder Instructions       General Comments Oldest daughter present during session. VSS. SpO2 dropping once to 87% on RA. Otherwise, pt >90% throughout on RA. RN present during session    Pertinent Vitals/ Pain       Pain Assessment: Faces Faces Pain Scale: Hurts even more Pain Location: L  hip Pain Descriptors / Indicators: Aching;Grimacing;Moaning;Constant Pain Intervention(s): Monitored during session;Limited activity within patient's tolerance;Repositioned  Home Living                                          Prior Functioning/Environment              Frequency  Min 2X/week        Progress Toward Goals  OT Goals(current goals can now be found in the care plan section)  Progress towards OT goals: Progressing toward goals  Acute Rehab OT Goals Patient Stated Goal: "Stop this pain" OT Goal Formulation: With patient Time For Goal Achievement: 03/05/18 Potential to Achieve Goals: Good ADL Goals Pt Will Perform Lower Body Dressing: with min assist;sit to/from stand;with caregiver independent in assisting;with adaptive equipment Pt Will Transfer to Toilet: with min assist;ambulating;bedside commode Pt Will Perform Toileting - Clothing Manipulation and hygiene: with min assist;sit to/from stand Additional ADL Goal #1: Pt will perform bed mobility with supervision in preparation for ADLs  Plan Discharge plan remains appropriate    Co-evaluation                 AM-PAC OT "6 Clicks" Daily Activity  Outcome Measure   Help from another person eating meals?: None Help from another person taking care of personal grooming?: A Little Help from another person toileting, which includes using toliet, bedpan, or urinal?: A Lot Help from another person bathing (including washing, rinsing, drying)?: A Lot Help from another person to put on and taking off regular upper body clothing?: A Little Help from another person to put on and taking off regular lower body clothing?: A Lot 6 Click Score: 16    End of Session Equipment Utilized During Treatment: Gait belt;Rolling walker;Oxygen;Other (comment)(post-op shoe)  OT Visit Diagnosis: Unsteadiness on feet (R26.81);Other abnormalities of gait and mobility (R26.89);Muscle weakness (generalized)  (M62.81);Other symptoms and signs involving cognitive function;Pain Pain - Right/Left: Left Pain - part of body: Hip   Activity Tolerance Patient tolerated treatment well;Patient limited by pain   Patient Left in chair;with call bell/phone within reach;with chair alarm set;with family/visitor present   Nurse Communication Mobility status;Patient requests pain meds;Weight bearing status        Time: 1829-9371 OT Time Calculation (min): 32 min  Charges: OT General Charges $OT Visit: 1 Visit OT Treatments $Self Care/Home Management : 23-37 mins  Addalynn Kumari MSOT, OTR/L Acute Rehab Pager: [phone] Office: [phone]   Theodoro Grist Brailyn Delman 02/21/2018, 3:01 PM

## 2018-02-21 NOTE — Progress Notes (Signed)
Subjective:     Patient awake this morning.  Reports pain as severe in L hip.  No new injury to L hip.  States that L hip increased after admission.  Xrays were negative.  Reports that he couldn't lay still for MRI due to pain.  Denies any pain in R foot.  Tolerating POs.  Admits to flatus.  Denies fever, chills, N/V, CP.  Objective:   VITALS:  Temp:  [97.8 F (36.6 C)-99 F (37.2 C)] 98.2 F (36.8 C) (01/22 0742) Pulse Rate:  [53-80] 68 (01/22 0630) Resp:  [10-22] 12 (01/22 0630) BP: (118-166)/(37-77) 156/63 (01/22 0630) SpO2:  [91 %-100 %] 100 % (01/22 0630) Weight:  [110.5 kg] 110.5 kg (01/22 0500)  General: WDWN patient in NAD. Psych:  Appropriate mood and affect. Neuro:  A&O x 3, Moving all extremities, sensation intact to light touch HEENT:  EOMs intact Chest:  Even non-labored respirations Skin:  Dressing C/D/I, no rashes or lesions Extremities: warm/dry, no visible edema, erythema or echymosis.  No lymphadenopathy. Pulses: Popliteus 2+ MSK:  ROM: full R ankle ROM.  Pain with any movement of L hip, MMT: able to perform quad set, (-) Homan's    LABS Recent Labs    02/19/18 0115 02/20/18 0401  HGB 11.3* 11.5*  WBC 15.2* 13.9*  PLT 209 229   Recent Labs    02/20/18 0401 02/21/18 0604  NA 142 140  K 3.0* 3.5  CL 100 100  CO2 32 31  BUN 14 14  CREATININE 1.46* 1.45*  GLUCOSE 213* 241*   Recent Labs    02/20/18 0401 02/21/18 0604  INR 1.25 1.31     Assessment/Plan:    Day 6 S/P R 2nd toe amp complicated by AMS and acute respiratory failure after D/C from surgery.  -WBAT R LE in flat post-op shoe. -Daily dry dressing changes leaving Mepitel layer intact -CCM management greatly appreciated. -For patient's L hip.  WBAT L LE.  Would recommend PT for ambulation.    Alfredo Martinez PA-C [facility] Office:  [phone]

## 2018-02-21 NOTE — Progress Notes (Signed)
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  He was noted to be hypoxic.  He was given morphine which lead to hypotension, for which he was given IV fluids, which led to increasing respiratory distress and the need for noninvasive mechanical ventilatory support. He was [facility] and found to be hypoxic and hypercarbic with a respiratory acidosis.    Significant Events: 1/18 intubation 1/19 extubation   Subjective: He continues to complain of severe unrelenting left hip pain.  Patient was unable to lay still therefore unable to do MRI.  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  History of PE/DVT On warfarin but INR not at goal therefore overlapping with Lovenox therapy  Hypokalemia  Supplement and follow -check magnesium in a.m.  Hypertension -Blood pressure fluctuating.  Pain could also be contributing.  Continue current medications.  Continue to monitor blood pressure closely and adjust medications as needed.  AKI on CKD baseline crt 1.2 - crt 3.48 at presentation - crt  improving   -Continue to monitor.  Worsening left hip pain history of chronic left hip pain requiring 4-6 doses of oxycodone daily - xrays w/o finding - Ortho has ordered an MRI. -However, unable to do MRI since the patient not able to lay still. -Ortho recommends weightbearing as tolerated, PT consult for ambulation.  DM A1c in July 2019 6.6 - CBG variable. -We will increase Lantus dose.  Continue Accu-Cheks, insulin sliding scale.  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 rate 16, height 5\' 10"  (1.778 m), weight 110.5 kg, SpO2 97 %.  Intake/Output Summary (Last 24 hours) at 02/21/2018 1109 Last data filed at 02/21/2018 0600 Gross per 24 hour  Intake 500 ml  Output 1100 ml  Net -600 ml   Filed Weights   02/20/18 0431 02/21/18 0500  Weight: 108.3 kg 110.5 kg    Examination: General: Complaining of left hip pain.  On oxygen by nasal cannula.  No acute respiratory distress Lungs: Clear to auscultation bilaterally without wheezes or crackles Cardiovascular: Regular rate and rhythm without murmur gallop or rub normal S1 and S2 Abdomen: Nontender, nondistended, soft, bowel sounds positive, no rebound, no ascites, no appreciable mass Extremities: R foot dressed and dry -  trace B LE edema   CBC: Recent Labs  Lab 02/18/18 0232 02/19/18 0115 02/20/18 0401  WBC 11.0* 15.2* 13.9*  HGB 10.7* 11.3* 11.5*  HCT 33.7* 35.5* 35.9*  MCV 94.4 93.9 92.3  PLT 179 209 229   Basic Metabolic Panel: Recent Labs  Lab 02/17/18 1416  02/19/18 0741 02/20/18 0401 02/21/18 0604  NA 142   < > 142 142  140  K 4.0   < > 3.5 3.0* 3.5  CL 105   < > 101 100 100  CO2 27   < > 24 32 31  GLUCOSE 215*   < > 218* 213* 241*  BUN 46*   < > 22 14 14   CREATININE 3.48*   < > 1.82* 1.46* 1.45*  CALCIUM 7.5*   < > 8.2* 8.4* 8.5*  MG 2.5*  --   --   --  1.7  PHOS 5.0*  --  3.1  --   --    < > = values in this interval not displayed.   GFR: Estimated Creatinine Clearance: 54.8 mL/min (A) (by C-G formula based on SCr of 1.45 mg/dL (H)).  Liver Function Tests: Recent Labs  Lab 02/17/18 1416 02/19/18 0741  AST 148*  --   ALT 104*  --   ALKPHOS 56  --   BILITOT 1.0  --   PROT 6.0*  --   ALBUMIN 3.0* 3.0*   Recent Labs  Lab 02/17/18 1416  LIPASE 20  AMYLASE 13*    Coagulation Profile: Recent Labs  Lab 02/15/18 0749 02/17/18 1416 02/20/18 0401 02/21/18 0604  INR 1.16 1.28 1.25 1.31    Cardiac Enzymes: Recent Labs  Lab 02/18/18 0852 02/18/18 1414 02/18/18 2124  TROPONINI 1.27* 1.49* 0.83*    HbA1C: Hgb A1c MFr Bld  Date/Time Value Ref Range Status  08/09/2017 02:02 PM 6.6 (H) 4.8 - 5.6 % Final    Comment:             Prediabetes: 5.7 - 6.4          Diabetes: >6.4          Glycemic control for adults with diabetes: <7.0     CBG: Recent Labs  Lab 02/20/18 1143 02/20/18 1812 02/20/18 2039 02/20/18 2354 02/21/18 0746  GLUCAP 167* 272* 262* 248* 210*    Recent Results (from the past 240 hour(s))  MRSA PCR Screening     Status: None   Collection Time: 02/17/18  1:03 PM  Result Value Ref Range Status   MRSA by PCR NEGATIVE NEGATIVE Final    Comment:        The GeneXpert MRSA Assay (FDA approved for NASAL specimens only), is one component of a comprehensive MRSA colonization surveillance program. It is not intended to diagnose MRSA infection nor to guide or monitor treatment for MRSA infections. Performed at [facility], [address], [place], [place] [address]   Urine culture     Status: None   Collection Time: 02/17/18  1:15 PM  Result Value  Ref Range Status   Specimen Description URINE, RANDOM  Final   Special Requests NONE  Final   Culture   Final    NO GROWTH Performed at [facility], [address] [place], [place] [address]    Report Status 02/18/2018 FINAL  Final  Culture, blood (routine x 2)     Status: None (Preliminary result)   Collection Time: 02/17/18  2:20 PM  Result Value Ref  Range Status   Specimen Description BLOOD RIGHT ANTECUBITAL  Final   Special Requests   Final    BOTTLES DRAWN AEROBIC ONLY Blood Culture adequate volume   Culture   Final    NO GROWTH 4 DAYS Performed at [facility], [address], [place], [place] [address]    Report Status PENDING  Incomplete  Culture, blood (routine x 2)     Status: None (Preliminary result)   Collection Time: 02/17/18  2:26 PM  Result Value Ref Range Status   Specimen Description BLOOD RIGHT HAND  Final   Special Requests   Final    BOTTLES DRAWN AEROBIC ONLY Blood Culture adequate volume   Culture   Final    NO GROWTH 4 DAYS Performed at [facility], [address], [place], [place] [address]    Report Status PENDING  Incomplete     Scheduled Meds: . allopurinol  300 mg Oral Daily  . amLODipine  10 mg Oral Daily  . atorvastatin  40 mg Oral Daily  . busPIRone  15 mg Oral BID  . clopidogrel  75 mg Oral q morning - 10a  . doxazosin  4 mg Oral QPM  . DULoxetine  60 mg Oral Daily  . enoxaparin (LOVENOX) injection  1 mg/kg Subcutaneous Q12H  . gabapentin  600 mg Oral BID  . insulin aspart  0-9 Units Subcutaneous TID WC  . insulin glargine  20 Units Subcutaneous QHS  . mouth rinse  15 mL Mouth Rinse BID  . metoprolol succinate  150 mg Oral Daily  . oxyCODONE  40 mg Oral Q12H  . Warfarin - Pharmacist Dosing Inpatient   Does not apply q1800     LOS: 4 days   Vonzella Nipple, MD [facility] Office  [phone] Pager - Text Page per Amion  If 7PM-7AM, please contact night-coverage per Amion 02/21/2018, 11:09 AM

## 2018-02-21 NOTE — Progress Notes (Signed)
Inpatient Diabetes Program Recommendations  AACE/ADA: New Consensus Statement on Inpatient Glycemic Control (2015)  Target Ranges:  Prepandial:   less than 140 mg/dL      Peak postprandial:   less than 180 mg/dL (1-2 hours)      Critically ill patients:  140 - 180 mg/dL   Lab Results  Component Value Date   GLUCAP 209 (H) 02/21/2018   HGBA1C 6.6 (H) 08/09/2017    Review of Glycemic Control Results for Paul Chen, Paul Chen (MRN [id number]) as of 02/21/2018 12:02  Ref. Range 02/20/2018 20:39 02/20/2018 23:54 02/21/2018 07:46 02/21/2018 11:21  Glucose-Capillary Latest Ref Range: 70 - 99 mg/dL 188 (H) 416 (H) 606 (H) 209 (H)   Diabetes history: Type 2 DM Outpatient Diabetes medications: Basaglar 40 units QD, Victoza 0.3 mg QD, Novolog 8-12 units BID Current orders for Inpatient glycemic control: Novolog 0-9 units TID, Lantus 25 units QHS  Inpatient Diabetes Program Recommendations:    If appropriate, consider adding A1C as last one was from 08/09/17.   If post prandials continue to exceed 180 mg/dL, consider adding Novolog 4 units TID (assuming patient is consuming >50% of meals).  Thanks, Lujean Rave, MSN, RNC-OB Diabetes Coordinator [phone] (8a-5p)

## 2018-02-22 ENCOUNTER — Encounter (HOSPITAL_COMMUNITY): Payer: Self-pay

## 2018-02-22 ENCOUNTER — Inpatient Hospital Stay (HOSPITAL_COMMUNITY): Payer: Medicare Other

## 2018-02-22 DIAGNOSIS — R7989 Other specified abnormal findings of blood chemistry: Secondary | ICD-10-CM

## 2018-02-22 DIAGNOSIS — G92 Toxic encephalopathy: Secondary | ICD-10-CM

## 2018-02-22 DIAGNOSIS — G4733 Obstructive sleep apnea (adult) (pediatric): Secondary | ICD-10-CM

## 2018-02-22 DIAGNOSIS — G8929 Other chronic pain: Secondary | ICD-10-CM

## 2018-02-22 LAB — CULTURE, BLOOD (ROUTINE X 2)
Culture: NO GROWTH
Culture: NO GROWTH
Special Requests: ADEQUATE
Special Requests: ADEQUATE

## 2018-02-22 LAB — GLUCOSE, CAPILLARY
Glucose-Capillary: 172 mg/dL — ABNORMAL HIGH (ref 70–99)
Glucose-Capillary: 135 mg/dL — ABNORMAL HIGH (ref 70–99)
Glucose-Capillary: 130 mg/dL — ABNORMAL HIGH (ref 70–99)
Glucose-Capillary: 248 mg/dL — ABNORMAL HIGH (ref 70–99)

## 2018-02-22 LAB — BASIC METABOLIC PANEL
Anion gap: 7 (ref 5–15)
BUN: 15 mg/dL (ref 8–23)
CO2: 31 mmol/L (ref 22–32)
Calcium: 8.3 mg/dL — ABNORMAL LOW (ref 8.9–10.3)
Chloride: 100 mmol/L (ref 98–111)
Creatinine, Ser: 1.46 mg/dL — ABNORMAL HIGH (ref 0.61–1.24)
GFR calc Af Amer: 54 mL/min — ABNORMAL LOW (ref >60)
GFR calc non Af Amer: 46 mL/min — ABNORMAL LOW (ref >60)
Glucose, Bld: 259 mg/dL — ABNORMAL HIGH (ref 70–99)
Potassium: 3.7 mmol/L (ref 3.5–5.1)
Sodium: 138 mmol/L (ref 135–145)

## 2018-02-22 LAB — PROTIME-INR
INR: 1.63
Prothrombin Time: 19.2 seconds — ABNORMAL HIGH (ref 11.4–15.2)

## 2018-02-22 MED ORDER — NALOXONE HCL 0.4 MG/ML IJ SOLN: 0.4000 mg | INTRAMUSCULAR | Status: DC | PRN

## 2018-02-22 MED ORDER — ACETAMINOPHEN 500 MG PO TABS
500.0000 mg | ORAL_TABLET | Freq: Three times a day (TID) | ORAL | Status: DC
  Administered 2018-02-22 – 2018-02-24 (×6): 500 mg via ORAL
  Filled 2018-02-22 (×6): qty 1

## 2018-02-22 MED ORDER — MORPHINE SULFATE (PF) 2 MG/ML IV SOLN: 1.0000 mg | INTRAVENOUS | Status: DC | PRN

## 2018-02-22 MED ORDER — LIDOCAINE 5 % EX PTCH
1.0000 | MEDICATED_PATCH | CUTANEOUS | Status: DC
  Administered 2018-02-22 – 2018-02-27 (×6): 1 via TRANSDERMAL
  Filled 2018-02-22 (×6): qty 1

## 2018-02-22 MED ORDER — LACTULOSE 10 GM/15ML PO SOLN: 30.0000 g | Freq: Every day | ORAL | Status: DC | PRN

## 2018-02-22 MED ORDER — OXYCODONE HCL 5 MG PO TABS: 5.0000 mg | ORAL_TABLET | Freq: Four times a day (QID) | ORAL | Status: DC | PRN

## 2018-02-22 MED ORDER — GERHARDT'S BUTT CREAM
TOPICAL_CREAM | Freq: Two times a day (BID) | CUTANEOUS | Status: DC
  Administered 2018-02-22 (×2): via TOPICAL
  Administered 2018-02-23: 1 via TOPICAL
  Administered 2018-02-23 – 2018-02-24 (×2): via TOPICAL
  Administered 2018-02-24: 1 via TOPICAL
  Administered 2018-02-25: 10:00:00 via TOPICAL
  Administered 2018-02-25: 1 via TOPICAL
  Administered 2018-02-26 – 2018-02-27 (×3): via TOPICAL
  Filled 2018-02-22: qty 1

## 2018-02-22 MED ORDER — POLYETHYLENE GLYCOL 3350 17 G PO PACK
17.0000 g | PACK | Freq: Every day | ORAL | Status: DC
  Administered 2018-02-22 – 2018-02-23 (×2): 17 g via ORAL
  Filled 2018-02-22 (×4): qty 1

## 2018-02-22 MED ORDER — ACETAMINOPHEN 325 MG PO TABS: 650.0000 mg | ORAL_TABLET | Freq: Four times a day (QID) | ORAL | Status: DC | PRN

## 2018-02-22 MED ORDER — NALOXONE HCL 0.4 MG/ML IJ SOLN
INTRAMUSCULAR | Status: AC
  Administered 2018-02-22: 0.4 mg
  Filled 2018-02-22: qty 1

## 2018-02-22 MED ORDER — WARFARIN SODIUM 3 MG PO TABS
3.0000 mg | ORAL_TABLET | Freq: Once | ORAL | Status: AC
  Administered 2018-02-22: 3 mg via ORAL
  Filled 2018-02-22: qty 1

## 2018-02-22 MED ORDER — DICLOFENAC SODIUM 1 % TD GEL
2.0000 g | Freq: Four times a day (QID) | TRANSDERMAL | Status: DC
  Administered 2018-02-22 – 2018-02-27 (×21): 2 g via TOPICAL
  Filled 2018-02-22: qty 100

## 2018-02-22 NOTE — Progress Notes (Signed)
Subjective:     Patient is very drowsy and lethargic, having a difficult time keeping eyes open to answer any questions or hold conversation.  Resting comfortably in bed.  I did get him to acknowledge that he worked well with therapy yesterday in regards to his L hip pain.  Objective:   VITALS:  Temp:  [97.6 F (36.4 C)-98.9 F (37.2 C)] 97.7 F (36.5 C) (01/23 1102) Pulse Rate:  [61-72] 65 (01/23 1102) Resp:  [15-21] 17 (01/23 1102) BP: (137-175)/(53-88) 153/56 (01/23 1102) SpO2:  [93 %-99 %] 94 % (01/23 1102) Weight:  [106.5 kg] 106.5 kg (01/23 0500)  General: WDWN patient in NAD. Psych: Lethargic and drowsy Neuro:  A&O x 3, Moving all extremities, sensation intact to light touch HEENT:  EOMs intact Chest:  Even non-labored respirations Skin: Dresssing C/D/I, no rashes or lesions Extremities: warm/dry, no visible edema, erythema or echymosis at operative site.  Lymphedema bilateral LE. Pulses: Popliteus 2+ MSK:  ROM: full Right ankle ROM, MMT: able to perform quad set    LABS Recent Labs    02/20/18 0401  HGB 11.5*  WBC 13.9*  PLT 229   Recent Labs    02/21/18 0604 02/22/18 0211  NA 140 138  K 3.5 3.7  CL 100 100  CO2 31 31  BUN 14 15  CREATININE 1.45* 1.46*  GLUCOSE 241* 259*   Recent Labs    02/21/18 0604 02/22/18 0211  INR 1.31 1.63     Assessment/Plan:    Day 7 S/P R 2nd toe amp complicated by AMS and acute respiratory failure after D/C from surgery.  -WBAT R LE in flat post-op shoe -Daily dry dressing changes, leaving mepitel layer intact -In regards to L hip pain, WBAT.  Up with therapy. -Plan for outpatient post-op visit with Dr. Lorrin JacksonHewitt   Kyshon Tolliver PA-C [facility] Office:  [phone]

## 2018-02-22 NOTE — Progress Notes (Signed)
ANTICOAGULATION CONSULT NOTE  Pharmacy Consult for lovenox + warfarin Indication: atrial fibrillation and hx of DVT and PE   No Known Allergies  Vital Signs: Temp: 97.7 F (36.5 C) (01/23 1102) Temp Source: Oral (01/23 1102) BP: 153/56 (01/23 1102) Pulse Rate: 65 (01/23 1102)  Labs: Recent Labs    02/20/18 0401 02/21/18 0604 02/22/18 0211  HGB 11.5*  --   --   HCT 35.9*  --   --   PLT 229  --   --   LABPROT 15.6* 16.2* 19.2*  INR 1.25 1.31 1.63  CREATININE 1.46* 1.45* 1.46*   Estimated Creatinine Clearance: 53.4 mL/min (A) (by C-G formula based on SCr of 1.46 mg/dL (H)).  Assessment: 53 yoM [facility] from OSH after a fall with h/o Afib and PE and DVT on warfarin 2.5 mg daily PTA, recently held for 2nd toe amputation performed 1/16 (INR 1.16), discharged with BID enoxaparin bridge to therapeutic INR. Pt has not been eating well per RN.   Pharmacy consulted to resume Lovenox/warfarin on 1/20. INR remains subtherapeutic at 1.63 (up from 1.31) now s/p 3 doses of warfarin. CBC stable, SCr improved. No active bleed issues documented. Weight ~106 kg.  Goal of Therapy:  INR 2-3 Anti-Xa level 0.6-1 units/ml 4hrs after LMWH dose given Monitor platelets by anticoagulation protocol: Yes   Plan:  Enoxaparin 110 mg (~1 mg/kg) SQ q12h >> if weight continues to decrease will reduce dose further Warfarin 3 mg PO x 1 dose again tonight F/U daily INR, CBC at least q72h on Lovenox, and s/sx of bleeding   Sherron Monday, PharmD, BCCCP Clinical Pharmacist  Pager: [phone] Phone: [phone]  Please check AMION for all [facility] contact numbers 02/22/2018 11:20 AM

## 2018-02-22 NOTE — Progress Notes (Signed)
Inpatient Diabetes Program Recommendations  AACE/ADA: New Consensus Statement on Inpatient Glycemic Control (2015)  Target Ranges:  Prepandial:   less than 140 mg/dL      Peak postprandial:   less than 180 mg/dL (1-2 hours)      Critically ill patients:  140 - 180 mg/dL   Lab Results  Component Value Date   GLUCAP 268 (H) 02/21/2018   HGBA1C 6.6 (H) 08/09/2017    Review of Glycemic Control Results for Paul RockersHIGDON, Jamail L (MRN [id number]) as of 02/22/2018 08:42  Ref. Range 02/21/2018 07:46 02/21/2018 11:21 02/21/2018 15:19 02/21/2018 21:26  Glucose-Capillary Latest Ref Range: 70 - 99 mg/dL 119210 (H) 147209 (H) 829254 (H) 268 (H)   Diabetes history: Type 2 DM Outpatient Diabetes medications: Basaglar 40 units QD, Victoza 0.3 mg QD, Novolog 8-12 units BID Current orders for Inpatient glycemic control: Novolog 0-9 units TID, Lantus 25 units QHS  Inpatient Diabetes Program Recommendations:   CBGs consistently >200  -Novolog 4 units tid meal coverage if eats 50%  Thank you, Darel HongJudy E. Tyler Robidoux, RN, MSN, CDE  Diabetes Coordinator Inpatient Glycemic Control Team Team Pager [phone] (8am-5pm) 02/22/2018 8:58 AM

## 2018-02-22 NOTE — Clinical Social Work Note (Signed)
Went by room to discuss SNF placement. Patient lethargic and unable to wake up to have conversation. RN aware.  Charlynn Court, CSW [phone]

## 2018-02-22 NOTE — Progress Notes (Signed)
Physical Therapy Treatment Patient Details Name: Paul Chen MRN: [id number] DOB: [birth year] Today's Date: 02/22/2018    History of Present Illness 78 yo male s/p R great toe amputation ( WBAT in postop shoe) 2 days prior to presentation to hospital for respiratory failure on 1/18; Extubated 1/19;  Extreme difficulty getting comfortable, limited by pain form long-standing L hip arthritis. PMH including Arthritis, Chronic renal insufficiency, CKD stage III, COPD, Coronary disease (October 2010), Depression, Diabetes mellitus, Diastolic dysfunction, Dyspnea, Gout, History of blood transfusion, History of diabetic ulcer of foot, HTN, Hypoventilation, Obesity, Pneumonia, Pulmonary embolism (March 2010), and Sleep apnea.    PT Comments    Pt lethargic and unable to mobilize past EOB.    Follow Up Recommendations  SNF     Equipment Recommendations  Other (comment)(To be determined at next venue)    Recommendations for Other Services       Precautions / Restrictions Precautions Precautions: Fall Required Braces or Orthoses: Other Brace Other Brace: Post op shoe; WBAT in postop shoe per Ortho Consult note Restrictions Weight Bearing Restrictions: No RLE Weight Bearing: Weight bearing as tolerated Other Position/Activity Restrictions: post-op shoe    Mobility  Bed Mobility Overal bed mobility: Needs Assistance Bed Mobility: Rolling;Supine to Sit;Sit to Supine Rolling: +2 for physical assistance;Total assist   Supine to sit: +2 for physical assistance;Total assist Sit to supine: +2 for physical assistance;Total assist   General bed mobility comments: Assist for all aspects. Pt c/o pain in lt hip with movement.  Transfers                 General transfer comment: Attempted to stand x 1 from EOB but pt only moved forward off of bed and did not rise at all resulting in pt sitting very close to edge of bed. Returned pt to supine for safety.   Ambulation/Gait                  Stairs             Wheelchair Mobility    Modified Rankin (Stroke Patients Only)       Balance Overall balance assessment: Needs assistance Sitting-balance support: Bilateral upper extremity supported;Feet supported Sitting balance-Leahy Scale: Poor Sitting balance - Comments: Pt sat EOB x 6-8 minutes with min guard assist for safety. Pt with eyes closed throughout except very briefly would open eyes to command.                                    Cognition Arousal/Alertness: Lethargic Behavior During Therapy: Flat affect Overall Cognitive Status: Impaired/Different from baseline Area of Impairment: Attention;Following commands;Awareness;Problem solving                   Current Attention Level: Sustained   Following Commands: Follows one step commands with increased time;Follows one step commands inconsistently   Awareness: Emergent Problem Solving: Slow processing;Requires verbal cues;Requires tactile cues;Decreased initiation;Difficulty sequencing General Comments: Pt lethargic. Would arouse but only briefly.      Exercises      General Comments        Pertinent Vitals/Pain Pain Assessment: Faces Faces Pain Scale: Hurts whole lot Pain Location: L hip Pain Descriptors / Indicators: Grimacing;Moaning;Constant Pain Intervention(s): Monitored during session;Repositioned    Home Living                      Prior Function  PT Goals (current goals can now be found in the care plan section) Progress towards PT goals: Not progressing toward goals - comment(lethargic)    Frequency    Min 2X/week      PT Plan Current plan remains appropriate    Co-evaluation              AM-PAC PT "6 Clicks" Mobility   Outcome Measure  Help needed turning from your back to your side while in a flat bed without using bedrails?: Total Help needed moving from lying on your back to sitting on the side of a flat  bed without using bedrails?: Total Help needed moving to and from a bed to a chair (including a wheelchair)?: Total Help needed standing up from a chair using your arms (e.g., wheelchair or bedside chair)?: Total Help needed to walk in hospital room?: Total Help needed climbing 3-5 steps with a railing? : Total 6 Click Score: 6    End of Session Equipment Utilized During Treatment: Gait belt Activity Tolerance: Patient limited by lethargy Patient left: in bed;with call bell/phone within reach;with bed alarm set Nurse Communication: Mobility status;Other (comment)(lethargy as she had reported to me prior to treatmen) PT Visit Diagnosis: Other abnormalities of gait and mobility (R26.89);Pain Pain - Right/Left: Left Pain - part of body: Hip     Time: 4034-7425 PT Time Calculation (min) (ACUTE ONLY): 15 min  Charges:  $Therapeutic Activity: 8-22 mins                     [facility] PT Acute Rehabilitation Services Pager [phone] Office [phone]    Angelina Ok [facility] 02/22/2018, 1:29 PM

## 2018-02-22 NOTE — Care Management Important Message (Signed)
Important Message  Patient Details  Name: Paul Chen MRN: [id number] Date of Birth: [birth year]   Medicare Important Message Given:  Yes    Oralia Rud Janus Vlcek 02/22/2018, 11:10 AM

## 2018-02-22 NOTE — Progress Notes (Signed)
Provider at bedside advise to give narcan. Pt very drowsy and lethargic.  Pt was arouse after narcan injection became very agitated and irritable. Charge nurse at bedside during episode. RN will continue to monitor.

## 2018-02-22 NOTE — Progress Notes (Signed)
Paged provider pt has not had a BM since 1/18; no orders. Pt also states that morphine works better for chronic hip pain vs the dilaudid ordered. Awaiting orders. RN will continue to monitor.

## 2018-02-22 NOTE — Progress Notes (Signed)
Paged provider about pt mentation being very different from yesterdays baseline. Pt very lethargic and drowsy. Pt easily aroused but very lethargic. Awaiting response. RN will continue to monitor.

## 2018-02-22 NOTE — Progress Notes (Signed)
Pt placed on BIPAP 12/6 with 2lpm oxygen- tolerating well at this time.

## 2018-02-22 NOTE — Progress Notes (Addendum)
PROGRESS NOTE    Michele RockersDanny L Strader  ZOX:[id number]  DOB: 05/03/1940  DOA: 02/17/2018 PCP: Carmin Richmondavis, James W, MD  Brief Narrative:    78 year old male with history of CAD status post PTCA, diabetes mellitus, COPD, diastolic CHF, paroxysmal A. fib who recently underwent toe amputation on January 16 presented to outside hospital on January 18 with a fall.He received morphine at outside facility subsequent to which he was hypotensive and also had respiratory distress after IV fluids requiring BiPAP.  He was transferred to [facility] and intubated for hypoxic/hypercarbic respiratory failure on January 18.  Extubated on January 19.  During his stay here he is complained of severe persistent left hip pain and has been receiving IV Dilaudid/long-acting OxyContin 40 mg twice daily.  Care assumed by me this morning.   Subjective:   Patient been complaining of persistent hip pain and this morning when seen by me he was somnolent but communicative.  He stated he has hip pain only when he moves and did not want to undergo MRI.  Around noon, he was noted to be lethargic by nurse and on bedside arrival patient sedated, hard to arouse and mumbling.  Narcan x1 given.  Within few minutes patient awake alert and talking.  He is complaining about reversing Narcan and waking him up.  He states he was advised and issued a CPAP machine many years back for sleep apnea but he stopped using it.  Objective: Vitals:   02/22/18 0500 02/22/18 0806 02/22/18 0952 02/22/18 1102  BP:  (!) 166/88 (!) 155/87 (!) 153/56  Pulse:  66 68 65  Resp:  19  17  Temp:  97.8 F (36.6 C)  97.7 F (36.5 C)  TempSrc:  Oral  Oral  SpO2:  93%  94%  Weight: 106.5 kg     Height:        Intake/Output Summary (Last 24 hours) at 02/22/2018 1229 Last data filed at 02/22/2018 0945 Gross per 24 hour  Intake 960 ml  Output 900 ml  Net 60 ml   Filed Weights   02/20/18 0431 02/21/18 0500 02/22/18 0500  Weight: 108.3 kg 110.5 kg 106.5  kg    Physical Examination:  General exam: Appeared calm and comfortable when seen this morning but upset after Narcan Respiratory system: Clear to auscultation. Respiratory effort normal. Cardiovascular system: S1 & S2 heard, RRR. No JVD, murmurs, rubs, gallops or clicks. No pedal edema. Gastrointestinal system: Abdomen is obese, nondistended, soft and nontender. No organomegaly or masses felt. Normal bowel sounds heard. Central nervous system: Sedated earlier, now awake and moaning. Moves all extremities. No focal neurological deficits. Extremities: Decreased range of motion along hip joints, states hurts if he moves Skin: Skin redness along the sacral/buttocks area Psychiatry: Judgement and insight appear normal. Mood & affect appropriate.     Data Reviewed: I have personally reviewed following labs and imaging studies  CBC: Recent Labs  Lab 02/17/18 1416 02/18/18 0232 02/19/18 0115 02/20/18 0401  WBC 10.4 11.0* 15.2* 13.9*  HGB 10.6* 10.7* 11.3* 11.5*  HCT 35.7* 33.7* 35.5* 35.9*  MCV 97.5 94.4 93.9 92.3  PLT 163 179 209 229   Basic Metabolic Panel: Recent Labs  Lab 02/17/18 1416 02/18/18 0232 02/19/18 0741 02/20/18 0401 02/21/18 0604 02/22/18 0211  NA 142 143 142 142 140 138  K 4.0 3.6 3.5 3.0* 3.5 3.7  CL 105 108 101 100 100 100  CO2 27 24 24  32 31 31  GLUCOSE 215* 176* 218* 213* 241*  259*  BUN 46* 42* 22 14 14 15   CREATININE 3.48* 2.81* 1.82* 1.46* 1.45* 1.46*  CALCIUM 7.5* 8.0* 8.2* 8.4* 8.5* 8.3*  MG 2.5*  --   --   --  1.7  --   PHOS 5.0*  --  3.1  --   --   --    GFR: Estimated Creatinine Clearance: 53.4 mL/min (A) (by C-G formula based on SCr of 1.46 mg/dL (H)). Liver Function Tests: Recent Labs  Lab 02/17/18 1416 02/19/18 0741  AST 148*  --   ALT 104*  --   ALKPHOS 56  --   BILITOT 1.0  --   PROT 6.0*  --   ALBUMIN 3.0* 3.0*   Recent Labs  Lab 02/17/18 1416  LIPASE 20  AMYLASE 13*   No results for input(s): AMMONIA in the last 168  hours. Coagulation Profile: Recent Labs  Lab 02/17/18 1416 02/20/18 0401 02/21/18 0604 02/22/18 0211  INR 1.28 1.25 1.31 1.63   Cardiac Enzymes: Recent Labs  Lab 02/18/18 0852 02/18/18 1414 02/18/18 2124  TROPONINI 1.27* 1.49* 0.83*   BNP (last 3 results) No results for input(s): PROBNP in the last 8760 hours. HbA1C: No results for input(s): HGBA1C in the last 72 hours. CBG: Recent Labs  Lab 02/21/18 0746 02/21/18 1121 02/21/18 1519 02/21/18 2126 02/22/18 0803  GLUCAP 210* 209* 254* 268* 172*   Lipid Profile: No results for input(s): CHOL, HDL, LDLCALC, TRIG, CHOLHDL, LDLDIRECT in the last 72 hours. Thyroid Function Tests: No results for input(s): TSH, T4TOTAL, FREET4, T3FREE, THYROIDAB in the last 72 hours. Anemia Panel: No results for input(s): VITAMINB12, FOLATE, FERRITIN, TIBC, IRON, RETICCTPCT in the last 72 hours. Sepsis Labs: Recent Labs  Lab 02/17/18 1416 02/17/18 1816  PROCALCITON 0.21  --   LATICACIDVEN 0.9 1.4    Recent Results (from the past 240 hour(s))  MRSA PCR Screening     Status: None   Collection Time: 02/17/18  1:03 PM  Result Value Ref Range Status   MRSA by PCR NEGATIVE NEGATIVE Final    Comment:        The GeneXpert MRSA Assay (FDA approved for NASAL specimens only), is one component of a comprehensive MRSA colonization surveillance program. It is not intended to diagnose MRSA infection nor to guide or monitor treatment for MRSA infections. Performed at [facility], [address], [place], [place] [address]   Urine culture     Status: None   Collection Time: 02/17/18  1:15 PM  Result Value Ref Range Status   Specimen Description URINE, RANDOM  Final   Special Requests NONE  Final   Culture   Final    NO GROWTH Performed at [facility], [address], [place], [place] [address]    Report Status 02/18/2018 FINAL  Final  Culture, blood (routine x 2)     Status: None   Collection Time: 02/17/18  2:20 PM    Result Value Ref Range Status   Specimen Description BLOOD RIGHT ANTECUBITAL  Final   Special Requests   Final    BOTTLES DRAWN AEROBIC ONLY Blood Culture adequate volume   Culture   Final    NO GROWTH 5 DAYS Performed at [facility], [address] [place], [place] [address]    Report Status 02/22/2018 FINAL  Final  Culture, blood (routine x 2)     Status: None   Collection Time: 02/17/18  2:26 PM  Result Value Ref Range Status  Specimen Description BLOOD RIGHT HAND  Final   Special Requests   Final    BOTTLES DRAWN AEROBIC ONLY Blood Culture adequate volume   Culture   Final    NO GROWTH 5 DAYS Performed at [facility], [address] [place], [place] [address]    Report Status 02/22/2018 FINAL  Final      Radiology Studies: No results found.      Scheduled Meds: . allopurinol  300 mg Oral Daily  . amLODipine  10 mg Oral Daily  . atorvastatin  40 mg Oral Daily  . busPIRone  15 mg Oral BID  . clopidogrel  75 mg Oral q morning - 10a  . diclofenac sodium  2 g Topical QID  . doxazosin  4 mg Oral QPM  . DULoxetine  60 mg Oral Daily  . enoxaparin (LOVENOX) injection  1 mg/kg Subcutaneous Q12H  . gabapentin  600 mg Oral BID  . insulin aspart  0-9 Units Subcutaneous TID WC  . insulin glargine  25 Units Subcutaneous QHS  . lidocaine  1 patch Transdermal Q24H  . mouth rinse  15 mL Mouth Rinse BID  . metoprolol succinate  150 mg Oral Daily  . polyethylene glycol  17 g Oral Daily  . warfarin  3 mg Oral ONCE-1800  . Warfarin - Pharmacist Dosing Inpatient   Does not apply q1800   Continuous Infusions: . sodium chloride Stopped (02/19/18 1619)    Assessment & Plan:    1.  Acute toxic encephalopathy: Likely secondary to opiates including OxyContin 40 mg twice daily as well as Dilaudid 1 mg every 2 as needed that he has orders for.  Patient responded well to Narcan x1.  He is demanding pain medications again and moaning and groaning.  He may have opiate  dependence and might be drug-seeking.  Advised patient of the risk of oversedation.  Will have low-dose morphine available with holding parameters.  DC IV Dilaudid.  DC long-acting OxyContin.  Will have short-acting oxycodone available. Scheduled and PRN Tylenol.  Voltaren gel/lidocaine patch ordered as well. Also on 600mg  Neurontin/Cymbalta   2.  Acute hypercapnic respiratory failure: Present on admission.  Likely multifactorial in the setting of obesity hypoventilation/obstructive sleep apnea and IV morphine.  He is currently saturating well on room air.  He is receiving CPAP at night.  3.  Fall/left hip pain: Patient appears to have history of chronic hip pain (listed for at least 10 years and problem list).  X-ray in this admission did not show any acute injury.  For he was seen by orthopedics and ordered MRI of the hip but patient unable to lay still according to prior notes. I reviewed will obtain CT pelvis.  If no acute injury noted, would avoid opiates given concern for problem #1.  Lidocaine patch/Voltaren gel for now.  Orthopedics has recommended weightbearing as tolerated.  PT attempted evaluation this morning but patient was too sedated.  4.  Paroxysmal atrial fibrillation: On beta-blockers and chronic anticoagulation.  5.  History of DVT/PE: On bridging Lovenox/warfarin until INR therapeutic.   6.  CAD status post PTCA, Troponemia: Likely secondary to demand ischemia per cardiology.  Echocardiogram shows EF of 60 to 65%.  No wall motion abnormalities noted  7.  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  Orthopedics following.  Cautious with pain management as described above.    8. Diabetes mellitus: Continue Lantus/sliding scale insulin  9. Sleep apnea: Needs  CPAP on discharge. Last sleep study >10 yrs back per patient  10. Constipation: secondary to opiates. Laxatives ordered  11. HTN: on Norvasc, Cardura and metoprolol. Hypotension POA. Now  resolved.  D/C foley catheter. Voiding trial.     DVT prophylaxis: On anticoagulation Code Status: Full code Family / Patient Communication: None present bedside Disposition Plan: To be decided.     LOS: 5 days    Time spent: 45 minutes    Alessandra BevelsNeelima Sahmya Arai, MD [facility] Pager [phone]  If 7PM-7AM, please contact night-coverage [email or web address] Password [email or web address] 02/22/2018, 12:29 PM

## 2018-02-22 NOTE — Progress Notes (Signed)
RN could not find any notes from urology about placement of foley cath.  RN was advise during report from ICU nurse (3W) foley placed by urology. Foley cath removed per verbal orders from provider. RN will monitor patient.

## 2018-02-23 DIAGNOSIS — S301XXD Contusion of abdominal wall, subsequent encounter: Secondary | ICD-10-CM

## 2018-02-23 LAB — GLUCOSE, CAPILLARY
Glucose-Capillary: 60 mg/dL — ABNORMAL LOW (ref 70–99)
Glucose-Capillary: 91 mg/dL (ref 70–99)
Glucose-Capillary: 177 mg/dL — ABNORMAL HIGH (ref 70–99)
Glucose-Capillary: 200 mg/dL — ABNORMAL HIGH (ref 70–99)
Glucose-Capillary: 269 mg/dL — ABNORMAL HIGH (ref 70–99)

## 2018-02-23 LAB — CBC
HCT: 33.2 % — ABNORMAL LOW (ref 39.0–52.0)
Hemoglobin: 10.9 g/dL — ABNORMAL LOW (ref 13.0–17.0)
MCH: 29.9 pg (ref 26.0–34.0)
MCHC: 32.8 g/dL (ref 30.0–36.0)
MCV: 91.2 fL (ref 80.0–100.0)
Platelets: 231 10*3/uL (ref 150–400)
RBC: 3.64 MIL/uL — ABNORMAL LOW (ref 4.22–5.81)
RDW: 14.7 % (ref 11.5–15.5)
WBC: 10.9 10*3/uL — ABNORMAL HIGH (ref 4.0–10.5)
nRBC: 0 % (ref 0.0–0.2)

## 2018-02-23 LAB — PROTIME-INR
INR: 2.09
Prothrombin Time: 23.2 seconds — ABNORMAL HIGH (ref 11.4–15.2)

## 2018-02-23 MED ORDER — INSULIN GLARGINE 100 UNIT/ML ~~LOC~~ SOLN
20.0000 [IU] | Freq: Every day | SUBCUTANEOUS | Status: DC
  Administered 2018-02-23 – 2018-02-24 (×2): 20 [IU] via SUBCUTANEOUS
  Filled 2018-02-23 (×2): qty 0.2

## 2018-02-23 MED ORDER — OXYCODONE HCL 5 MG PO TABS
2.5000 mg | ORAL_TABLET | Freq: Four times a day (QID) | ORAL | Status: DC | PRN
  Administered 2018-02-23 – 2018-02-27 (×7): 2.5 mg via ORAL
  Filled 2018-02-23 (×7): qty 1

## 2018-02-23 NOTE — Progress Notes (Signed)
Pt seen by Dr Waymon Amato, verbal orders given for  Straight cath x 2, then after if pt hasn't passed urine on his own to place a foley in.

## 2018-02-23 NOTE — Progress Notes (Signed)
Pt unable to void on own.  Bladder scan 350.  Orders were received earlier if pt unable to void place foley catheter.  Placed Foley cath per sterile tech with another Charity fundraiser.   Foley anchor placed.  Foley dated and timed.  Pt tolerated well.

## 2018-02-23 NOTE — Clinical Social Work Note (Signed)
Clinical Social Work Assessment  Patient Details  Name: Paul Chen MRN: [id number] Date of Birth: 1940/12/16  Date of referral:  02/23/18               Reason for consult:  Facility Placement, Discharge Planning                Permission sought to share information with:  Facility Sport and exercise psychologist, Family Supports Permission granted to share information::  Yes, Verbal Permission Granted  Name::     Shaheer Bonfield  Agency::  SNF's  Relationship::  Daughter  Contact Information:  [phone]  Housing/Transportation Living arrangements for the past 2 months:  Single Family Home Source of Information:  Patient, Medical Team Patient Interpreter Needed:  None Criminal Activity/Legal Involvement Pertinent to Current Situation/Hospitalization:  No - Comment as needed Significant Relationships:  Adult Children, Siblings Lives with:  Adult Children Do you feel safe going back to the place where you live?  Yes Need for family participation in patient care:  Yes (Comment)  Care giving concerns:  PT recommending SNF once medically stable for discharge.   Social Worker assessment / plan:  CSW met with patient. No supports at bedside. CSW introduced role and explained that PT recommendations would be discussed. Patient would prefer not to go to SNF but agrees that he may not be able to manage at home. He lives with his daughter. He is agreeable to CSW sending out referral. He will review CMS Medicare scores provided to him that list facilities within 50 miles of his zip code. Patient gave CSW permission to call his daughter Marlowe Kays. CSW left her a voicemail. PASARR under manual review. No further concerns. CSW encouraged patient to contact CSW as needed. CSW will continue to follow patient for support and facilitate discharge to SNF once medically stable.  Employment status:  Retired Nurse, adult PT Recommendations:  [place] /  Referral to community resources:  [place]  Patient/Family's Response to care:  Patient unsure about SNF. Patient's family supportive and involved in patient's care. Patient appreciated social work intervention.  Patient/Family's Understanding of and Emotional Response to Diagnosis, Current Treatment, and Prognosis:  Patient has a good understanding of the reason for admission and his need for rehab prior to returning home. Patient appears pleased with hospital care.  Emotional Assessment Appearance:  Appears stated age Attitude/Demeanor/Rapport:  Engaged, Gracious Affect (typically observed):  Appropriate, Calm, Pleasant Orientation:  Oriented to Self, Oriented to Place, Oriented to  Time, Oriented to Situation Alcohol / Substance use:  Never Used Psych involvement (Current and /or in the community):  No (Comment)  Discharge Needs  Concerns to be addressed:  Care Coordination Readmission within the last 30 days:  Yes Current discharge risk:  Dependent with Mobility Barriers to Discharge:  Continued Medical Work up, Ship broker, Programmer, applications ([place])   Candie Chroman, LCSW 02/23/2018, 11:42 AM

## 2018-02-23 NOTE — Clinical Social Work Placement (Signed)
   CLINICAL SOCIAL WORK PLACEMENT  NOTE  Date:  02/23/2018  Patient Details  Name: Paul Chen MRN: [id number] Date of Birth: [birth year]  Clinical Social Work is seeking post-discharge placement for this patient at the Skilled  Nursing Facility level of care (*CSW will initial, date and re-position this form in  chart as items are completed):      Patient/family provided with [facility] Clinical Social Work Department's list of facilities offering this level of care within the geographic area requested by the patient (or if unable, by the patient's family).      Patient/family informed of their freedom to choose among providers that offer the needed level of care, that participate in Medicare, Medicaid or managed care program needed by the patient, have an available bed and are willing to accept the patient.      Patient/family informed of [place]'s ownership interest in [facility] and [facility], as well as of the fact that they are under no obligation to receive care at these facilities.  PASRR submitted to EDS on 02/21/18     PASRR number received on       Existing PASRR number confirmed on       FL2 transmitted to all facilities in geographic area requested by pt/family on 02/23/18     FL2 transmitted to all facilities within larger geographic area on       Patient informed that his/her managed care company has contracts with or will negotiate with certain facilities, including the following:            Patient/family informed of bed offers received.  Patient chooses bed at       Physician recommends and patient chooses bed at      Patient to be transferred to   on  .  Patient to be transferred to facility by       Patient family notified on   of transfer.  Name of family member notified:        PHYSICIAN Please sign FL2     Additional Comment:    _______________________________________________ Margarito Liner, LCSW 02/23/2018, 11:49 AM

## 2018-02-23 NOTE — Progress Notes (Signed)
Subjective:     Patient reports pain as moderate to severe in L hip.  CT scan ordered yesterday demonstrated illdefined hyperdense prominence that is likely hematoma vs hemorrhage.  H&H not suggestive of hemorrhage.  Tolerating POs well.  Admits to flatus.  States that he has worked well with therapy.  Denies any pain in his R foot.  Objective:   VITALS:  Temp:  [97.5 F (36.4 C)-98.8 F (37.1 C)] 98.2 F (36.8 C) (01/24 0722) Pulse Rate:  [65-72] 72 (01/24 0722) Resp:  [17-20] 20 (01/24 0722) BP: (101-171)/(56-88) 128/80 (01/24 0722) SpO2:  [93 %-95 %] 95 % (01/23 1909) Weight:  [110.7 kg] 110.7 kg (01/24 0451)  General: WDWN patient in NAD. Psych:  Appropriate mood and affect. Neuro:  A&O x 3, Moving all extremities, sensation intact to light touch HEENT:  EOMs intact Chest:  Even non-labored respirations Skin: Dressing C/D/I, no rashes or lesions Extremities: warm/dry, no visible edema, erythema or echymosis.  Bilateral LE lymphedema. Pulses: Popliteus 2+ MSK:  Tender to palpation over superficial lateral hip and greater trochanter.  ROM: full Right ankle ROM, MMT: able to perform quad set    LABS No results for input(s): HGB, WBC, PLT in the last 72 hours. Recent Labs    02/21/18 0604 02/22/18 0211  NA 140 138  K 3.5 3.7  CL 100 100  CO2 31 31  BUN 14 15  CREATININE 1.45* 1.46*  GLUCOSE 241* 259*   Recent Labs    02/21/18 0604 02/22/18 0211  INR 1.31 1.63     Assessment/Plan:   Day 8 S/P R 2nd toe amp complicated by AMS and acute respiratory failure after D/C from surgery.  -WBAT R LE in flat post-op shoe. -Daily dry dressing changes leaving Mepitel layer in place. -Continue working with therapy for L hip pain.  This is likely hematoma due to contusion. -Plan for outpatient post-op visit with Dr. Victorino Dike. -Ortho signing off   Alfredo Martinez PA-C [facility] Office:  [phone]

## 2018-02-23 NOTE — Clinical Social Work Note (Signed)
30 day note on front of chart for MD to sign. MD aware.  Charlynn Court, CSW [phone]

## 2018-02-23 NOTE — [place] FL2 (Signed)
[place] MEDICAID FL2 LEVEL OF CARE SCREENING TOOL     IDENTIFICATION  Patient Name: Paul RockersDanny L Sneath Birthdate: 05-May-1940 Sex: male Admission Date (Current Location): 02/17/2018  [facility] [place]  [place]   Facility and Address:  [facility] [place], [address], [place], [place] [address]      Provider Number: [id number]  Attending Physician Name and Address:  Elease EtienneHongalgi, Anand D, MD  Relative Name and Phone Number:       Current Level of Care: Hospital Recommended Level of Care: Skilled Nursing Facility Prior Approval Number:    Date Approved/Denied:   PASRR Number: Manual review  Discharge Plan: SNF    Current Diagnoses: Patient Active Problem List   Diagnosis Date Noted  . Respiratory failure (HCC) 02/17/2018  . 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 Carotid artery disease (HCC) 08/20/2010  . Hyperlipidemia 08/20/2010  . 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 HEMOPERITONEUM 03/11/2009  . OTHER ABNORMALITY OF URINATION 03/11/2009  . OBESITY 01/08/2009  . INADEQUATE SLEEP HYGIENE 01/08/2009  . OBSTRUCTIVE SLEEP APNEA 01/08/2009  . LOW BACK PAIN, CHRONIC 01/08/2009  . Coronary atherosclerosis 12/17/2008  . HYPERTENSION, PULMONARY 12/17/2008  . SHORTNESS OF BREATH 12/17/2008  . DIABETES, TYPE 1 12/16/2008  . DEPRESSION 12/16/2008  . Essential hypertension 12/16/2008  . OSTEOARTHRITIS 12/16/2008    Orientation RESPIRATION BLADDER Height & Weight     Self, Time, Situation, Place  Normal Continent Weight: 244 lb 0.8 oz (110.7 kg) Height:  5\' 10"  (177.8 cm)  BEHAVIORAL SYMPTOMS/MOOD NEUROLOGICAL BOWEL NUTRITION STATUS  (None) (None) Continent Diet(Heart healthy/carb modified.)  AMBULATORY STATUS COMMUNICATION OF NEEDS Skin   Extensive Assist Verbally Skin abrasions, Bruising, Other (Comment), Surgical wounds(Amputation, Excoriated, Intertriginous Dermatitis, MASD.)                       Personal Care Assistance Level of Assistance  Bathing, Feeding, Dressing Bathing Assistance: Limited assistance Feeding assistance: Limited assistance Dressing Assistance: Limited assistance     Functional Limitations Info  Hearing, Sight, Speech Sight Info: Adequate Hearing Info: Adequate Speech Info: Adequate    SPECIAL CARE FACTORS FREQUENCY  PT (By licensed PT), OT (By licensed OT)     PT Frequency: 5 x week OT Frequency: 5 x week            Contractures Contractures Info: Not present    Additional Factors Info  Code Status, Allergies Code Status Info: Full Allergies Info: NKDA           Current Medications (02/23/2018):  This is the current hospital active medication list Current Facility-Administered Medications  Medication Dose Route Frequency Provider Last Rate Last Dose  . 0.9 %  sodium chloride infusion  250 mL Intravenous Continuous Arnetha CourserAmin, Sumayya, MD   Stopped at 02/19/18 1619  . acetaminophen (TYLENOL) tablet 500 mg  500 mg Oral TID Alessandra BevelsKamineni, Neelima, MD   500 mg at 02/23/18 0930  . acetaminophen (TYLENOL) tablet 650 mg  650 mg Oral Q6H PRN Alessandra BevelsKamineni, Neelima, MD      . allopurinol (ZYLOPRIM) tablet 300 mg  300 mg Oral Daily Arnetha CourserAmin, Sumayya, MD   300 mg at 02/23/18 0930  . amLODipine (NORVASC) tablet 10 mg  10 mg Oral Daily Arnetha CourserAmin, Sumayya, MD   10 mg at 02/23/18 0929  . atorvastatin (LIPITOR) tablet 40 mg  40 mg Oral Daily Arnetha CourserAmin, Sumayya, MD   40 mg at 02/23/18 0930  . busPIRone (BUSPAR)  tablet 15 mg  15 mg Oral BID Arnetha CourserAmin, Sumayya, MD   15 mg at 02/23/18 0930  . clopidogrel (PLAVIX) tablet 75 mg  75 mg Oral q morning - 10a Arnetha CourserAmin, Sumayya, MD   75 mg at 02/23/18 0929  . diclofenac sodium (VOLTAREN) 1 % transdermal gel 2 g  2 g Topical QID Alessandra BevelsKamineni, Neelima, MD   2 g at 02/23/18 0935  . doxazosin (CARDURA) tablet 4 mg  4 mg Oral QPM Arnetha CourserAmin, Sumayya, MD   4 mg at 02/22/18 1755  . DULoxetine (CYMBALTA) DR capsule 60 mg  60 mg Oral Daily Arnetha CourserAmin, Sumayya, MD   60 mg  at 02/23/18 0930  . gabapentin (NEURONTIN) capsule 600 mg  600 mg Oral BID Arnetha CourserAmin, Sumayya, MD   600 mg at 02/23/18 0929  . Gerhardt's butt cream   Topical BID Alessandra BevelsKamineni, Neelima, MD      . hydrALAZINE (APRESOLINE) injection 10 mg  10 mg Intravenous Q6H PRN Arnetha CourserAmin, Sumayya, MD   10 mg at 02/18/18 2326  . insulin aspart (novoLOG) injection 0-9 Units  0-9 Units Subcutaneous TID WC Lonia BloodMcClung, Jeffrey T, MD   1 Units at 02/22/18 1650  . insulin glargine (LANTUS) injection 25 Units  25 Units Subcutaneous QHS Vonzella NippleMatcha, Anupama, MD   25 Units at 02/22/18 2120  . labetalol (NORMODYNE,TRANDATE) injection 10 mg  10 mg Intravenous Q2H PRN Arnetha CourserAmin, Sumayya, MD   10 mg at 02/20/18 0056  . lactulose (CHRONULAC) 10 GM/15ML solution 30 g  30 g Oral Daily PRN Kamineni, Neelima, MD      . lidocaine (LIDODERM) 5 % 1 patch  1 patch Transdermal Q24H Alessandra BevelsKamineni, Neelima, MD   1 patch at 02/23/18 0930  . MEDLINE mouth rinse  15 mL Mouth Rinse BID Arnetha CourserAmin, Sumayya, MD   15 mL at 02/22/18 2120  . metoprolol succinate (TOPROL-XL) 24 hr tablet 150 mg  150 mg Oral Daily Arnetha CourserAmin, Sumayya, MD   150 mg at 02/23/18 [id number]  . naloxone [facility](NARCAN) injection 0.4 mg  0.4 mg Intravenous PRN Alessandra BevelsKamineni, Neelima, MD      . oxyCODONE (Oxy IR/ROXICODONE) immediate release tablet 5 mg  5 mg Oral Q6H PRN Kamineni, Neelima, MD      . polyethylene glycol (MIRALAX / GLYCOLAX) packet 17 g  17 g Oral Daily Alessandra BevelsKamineni, Neelima, MD   17 g at 02/23/18 0930  . Warfarin - Pharmacist Dosing Inpatient   Does not apply [id number] Arnetha CourserAmin, Sumayya, MD         Discharge Medications: Please see discharge summary for a list of discharge medications.  Relevant Imaging Results:  Relevant Lab Results:   Additional Information SS#: [id number]  Margarito LinerSarah C Micha Erck, LCSW

## 2018-02-23 NOTE — Progress Notes (Signed)
Md notified  Pt's CBG 269 with no HS coverage.  Paul Chen, Charnae Lill T

## 2018-02-23 NOTE — Progress Notes (Signed)
ANTICOAGULATION CONSULT NOTE  Pharmacy Consult for lovenox + warfarin Indication: atrial fibrillation and hx of DVT and PE   No Known Allergies  Vital Signs: Temp: 98.2 F (36.8 C) (01/24 0722) Temp Source: Oral (01/24 0722) BP: 180/61 (01/24 0936) Pulse Rate: 72 (01/24 0722)  Labs: Recent Labs    02/21/18 0604 02/22/18 0211 02/23/18 0724  HGB  --   --  10.9*  HCT  --   --  33.2*  PLT  --   --  231  LABPROT 16.2* 19.2* 23.2*  INR 1.31 1.63 2.09  CREATININE 1.45* 1.46*  --    Estimated Creatinine Clearance: 54.5 mL/min (A) (by C-G formula based on SCr of 1.46 mg/dL (H)).  Assessment: 84 yoM [facility] from OSH after a fall with h/o Afib and PE and DVT on warfarin 2.5 mg daily PTA, recently held for 2nd toe amputation performed 1/16 (INR 1.16), discharged with BID enoxaparin bridge to therapeutic INR> readmitted for SOB  Pt has not been eating well per RN.   Pharmacy consulted to resume Lovenox/warfarin on 1/20. INR now therapeutic at 2.09.  CBC stable, SCr improved. L hip pain likely hematoma - will stop enoxaparin.. Weight ~106 kg.  Goal of Therapy:  INR 2-3 Anti-Xa level 0.6-1 units/ml 4hrs after LMWH dose given Monitor platelets by anticoagulation protocol: Yes   Plan:  Stop enoaxaprin Restart home warfarin dose 2.5mg  Daily Daily Protime, CBC  Leota Sauers Pharm.D. CPP, BCPS Clinical Pharmacist [phone] 02/23/2018 11:15 AM

## 2018-02-23 NOTE — Progress Notes (Signed)
PROGRESS NOTE   Paul Chen  WUJ:[id number]    DOB: 1940-05-10    DOA: 02/17/2018  PCP: Carmin Richmondavis, James W, MD   I have briefly reviewed patients previous medical [facility].  Brief Narrative:  78 year old male with PMH of COPD, DM 2, CAD status post stent in 2010, morbid obesity, CHF, A. fib, toe [facility] who [facility] after a [facility].  He was noted to be hypoxic.  He was given morphine which led to hypotension, for which she was given IV fluids, which led to increasing respiratory distress and the need for noninvasive mechanical ventilatory support.  [facility]  and found to be hypoxic and hypercarbic with respiratory acidosis.  He was intubated for mechanical ventilation on 1/18 and admitted to ICU by CCM.  Extubated 1/19.  [facility]   Assessment & Plan:   Active Problems:   Respiratory failure (HCC)   Acute on chronic hypoxic and hypercarbic respiratory failure  Likely in the context of narcotic use for recent foot surgery complicating underlying COPD and OSA.  Extubated 1/19.  Minimize opioid and sedating medications.  Still at high risk for recurrent respiratory failure.  Paroxysmal A. fib with RVR Completed Lovenox bridging.  INR therapeutic today.  Target for low end of goal due to left psoas hematoma.  Continue Toprol-XL 150 mg daily.  Currently in sinus rhythm.  CAD with DES to the proximal RCA 2010 EKG showed new onset ST depression in anterior lateral leads which were not there on January 16.  Cardiology felt this is demand ischemia.  TTE showed no wall motion abnormalities and no further inpatient work-up planned.  Peak troponin I 0.49.  Cardiology may consider outpatient noninvasive stress test. As per cardiology, on monotherapy with Plavix due to CAD/PTCA and on Coumadin for A. fib, DVT and PE.  History of PE/DVT Completed Lovenox bridging.  INR  therapeutic on Coumadin, target to low end of goal due to left psoas hematoma.  Hypokalemia:  Replaced.  Magnesium 1.7.   Essential hypertension Mildly uncontrolled.  Continue amlodipine 10 mg daily, doxazosin 4 mg every afternoon, Toprol-XL 150 mg daily.  Adjust medications as needed.  Acute kidney injury on stage III chronic kidney disease Baseline creatinine reportedly 1.2.  Creatinine 3.48 at presentation.  This is improved and plateaued in the 1.4 range for the last 3 days,?  New baseline.  Avoid nephrotoxic's.  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  CBGs fluctuating.  Had hypoglycemic range/60 mg/dL this morning.  Suspect due to variable oral intake due to mental status changes.  Consider cutting back on Lantus from 25 to 20 units nightly.  SSI.  Adjust insulins as needed.  S/P right second toe amputation Orthopedic sign off 1/24 appreciated.  WBAT RLE in flat postop shoe, daily dry dressing changes living Mepitel layer in place, postop outpatient follow-up with [name].  Acute toxic metabolic encephalopathy Noted to be somnolent on 1/23, responded to Narcan.  Suspected due to opioids.  Discontinued IV Dilaudid and long-acting OxyContin.  Continued on low-dose morphine and oxycodone IR along with Neurontin and Cymbalta.  Still somewhat somnolent, discussed in detail with RN at bedside and avoid sedating medications.  Fall/left hip pain/left psoas hematoma Reportedly with history of chronic hip pain.  X-ray on admission did not show acute injury.  Seen by orthopedics and MRI of hip was ordered but  unable to complete because he was not able to lie still.  CT pelvis 1/23 showed possible left psoas muscle hemorrhage/hematoma measuring approximately 4 x 4 x 5 cm.  No acute fractures.  The hematoma may be the source of his pain.  Supportive treatment.  If pain worsens or progressive anemia then may have to hold anticoagulation.  Acute urinary retention Foley catheter was discontinued  and as per RN, overnight 1/23 had in and out catheterization yielding 700 mL urine.  Bladder scan this morning again showed 600 mL urine.  If patient unable to spontaneously void, may need Foley catheter placement and outpatient follow-up with urology  OSA Likely need CPAP at discharge.  Constipation Bowel regimen.  Abnormal LFTs Unclear etiology.  Follow CMP in a.m.  Normocytic anemia Stable.  Follow CBCs closely.  DVT prophylaxis: Anticoagulated on Coumadin. Code Status: Full Family Communication: None at bedside Disposition: To be determined, possibly SNF when medically improved.   Consultants:  PCCM-signed off Cardiology-signed off Orthopedics-signed off.  Procedures:  Intubated/extubated. Foley catheter discontinued 1/23 In and out catheterization  Antimicrobials:  None   Subjective: Interviewed and examined with RN.  Sleepy but arousable.  Oriented to self and place.  Reports pain in his right leg/foot and left lower back/flank.  Denies any other complaints.  ROS: As above were unable due to mental status changes.  Objective:  Vitals:   02/23/18 0451 02/23/18 0722 02/23/18 0936 02/23/18 1112  BP: (!) 156/72 128/80 (!) 180/61 (!) 185/64  Pulse:  72    Resp:  20  (!) 23  Temp: (!) 97.5 F (36.4 C) 98.2 F (36.8 C)  98.5 F (36.9 C)  TempSrc:  Oral  Oral  SpO2:      Weight: 110.7 kg     Height:        Examination:  General exam: Elderly male, moderately built and obese, lying comfortably propped up in bed without distress. Respiratory system: Clear to auscultation. Respiratory effort normal. Cardiovascular system: S1 & S2 heard, RRR. No JVD, murmurs, rubs, gallops or clicks. No pedal edema.  Telemetry personally reviewed: Sinus rhythm. Gastrointestinal system: Abdomen is nondistended, soft and nontender. No organomegaly or masses felt. Normal bowel sounds heard. Central nervous system: Mental status as noted above. No focal neurological  deficits. Extremities: Symmetric 5 x 5 power.  Healed left great toe amputation.  Right second toe amputation postop site clean without acute findings. Skin: No rashes, lesions or ulcers Psychiatry: Judgement and insight impaired. Mood & affect unable to assess at this time.     Data Reviewed: I have personally reviewed following labs and imaging studies  CBC: Recent Labs  Lab 02/17/18 1416 02/18/18 0232 02/19/18 0115 02/20/18 0401 02/23/18 0724  WBC 10.4 11.0* 15.2* 13.9* 10.9*  HGB 10.6* 10.7* 11.3* 11.5* 10.9*  HCT 35.7* 33.7* 35.5* 35.9* 33.2*  MCV 97.5 94.4 93.9 92.3 91.2  PLT 163 179 209 229 231   Basic Metabolic Panel: Recent Labs  Lab 02/17/18 1416 02/18/18 0232 02/19/18 0741 02/20/18 0401 02/21/18 0604 02/22/18 0211  NA 142 143 142 142 140 138  K 4.0 3.6 3.5 3.0* 3.5 3.7  CL 105 108 101 100 100 100  CO2 27 24 24  32 31 31  GLUCOSE 215* 176* 218* 213* 241* 259*  BUN 46* 42* 22 14 14 15   CREATININE 3.48* 2.81* 1.82* 1.46* 1.45* 1.46*  CALCIUM 7.5* 8.0* 8.2* 8.4* 8.5* 8.3*  MG 2.5*  --   --   --  1.7  --  PHOS 5.0*  --  3.1  --   --   --    Liver Function Tests: Recent Labs  Lab 02/17/18 1416 02/19/18 0741  AST 148*  --   ALT 104*  --   ALKPHOS 56  --   BILITOT 1.0  --   PROT 6.0*  --   ALBUMIN 3.0* 3.0*   Coagulation Profile: Recent Labs  Lab 02/17/18 1416 02/20/18 0401 02/21/18 0604 02/22/18 0211 02/23/18 0724  INR 1.28 1.25 1.31 1.63 2.09   Cardiac Enzymes: Recent Labs  Lab 02/18/18 0852 02/18/18 1414 02/18/18 2124  TROPONINI 1.27* 1.49* 0.83*   HbA1C: No results for input(s): HGBA1C in the last 72 hours. CBG: Recent Labs  Lab 02/22/18 1607 02/22/18 2107 02/23/18 0724 02/23/18 0756 02/23/18 1114  GLUCAP 130* 248* 60* 91 177*    Recent Results (from the past 240 hour(s))  MRSA PCR Screening     Status: None   Collection Time: 02/17/18  1:03 PM  Result Value Ref Range Status   MRSA by PCR NEGATIVE NEGATIVE Final     Comment:        The GeneXpert MRSA Assay (FDA approved for NASAL specimens only), is one component of a comprehensive MRSA colonization surveillance program. It is not intended to diagnose MRSA infection nor to guide or monitor treatment for MRSA infections. Performed at [facility], [address], [place], [place] [address]   Urine culture     Status: None   Collection Time: 02/17/18  1:15 PM  Result Value Ref Range Status   Specimen Description URINE, RANDOM  Final   Special Requests NONE  Final   Culture   Final    NO GROWTH Performed at [facility], [address], [place], [place] [address]    Report Status 02/18/2018 FINAL  Final  Culture, blood (routine x 2)     Status: None   Collection Time: 02/17/18  2:20 PM  Result Value Ref Range Status   Specimen Description BLOOD RIGHT ANTECUBITAL  Final   Special Requests   Final    BOTTLES DRAWN AEROBIC ONLY Blood Culture adequate volume   Culture   Final    NO GROWTH 5 DAYS Performed at [facility], [address], [place], [place] [address]    Report Status 02/22/2018 FINAL  Final  Culture, blood (routine x 2)     Status: None   Collection Time: 02/17/18  2:26 PM  Result Value Ref Range Status   Specimen Description BLOOD RIGHT HAND  Final   Special Requests   Final    BOTTLES DRAWN AEROBIC ONLY Blood Culture adequate volume   Culture   Final    NO GROWTH 5 DAYS Performed at [facility], [address], [place], [place] [address]    Report Status 02/22/2018 FINAL  Final         Radiology Studies: Ct Pelvis Wo Contrast  Result Date: 02/22/2018 CLINICAL DATA:  78 year old male with persistent LEFT hip pain following fall. Patient on anticoagulation. Recent PTCA. EXAM: CT PELVIS WITHOUT CONTRAST TECHNIQUE: Multidetector CT imaging of the pelvis was performed following the standard protocol without intravenous contrast. COMPARISON:  03/12/2009 CT FINDINGS: Urinary Tract: Gas in the  bladder is noted probably related to recent catheterization but correlate with any signs of infection. Bowel:  No abnormalities identified of pelvic bowel loops. Vascular/Lymphatic: Aortic, iliac and femoral atherosclerotic calcification noted without evidence of aneurysm. Reproductive:  Unremarkable Other:  No ascites or pneumoperitoneum. Musculoskeletal: Ill-defined hyperdense prominence of the LEFT psoas muscle is noted, likely representing hemorrhage/hematoma. This area measures approximately 4 x 4 x 5 cm, but difficult to accurately measure due to adjacent lumbar spine surgical hardware artifact. No acute fracture is identified. Posterior/interbody fusion and decompression changes at L5-S1 noted. SI joint bony fusion noted. Mild degenerative changes within both hips identified. IMPRESSION: 1. Ill-defined hyperdense prominence of the LEFT psoas muscle likely representing hemorrhage/hematoma and measuring approximately 4 x 4 x 5 cm. 2. Bladder gas which may be related to recent catheterization but correlate with infection. 3. No evidence of acute fracture.  Lumbar spine surgical changes. 4.  Aortic Atherosclerosis (ICD10-I70.0). These results will be called to the ordering clinician or representative by the Radiologist Assistant, and communication documented in the PACS or zVision Dashboard. Electronically Signed   By: Harmon Pier M.D.   On: 02/22/2018 20:31        Scheduled Meds: . acetaminophen  500 mg Oral TID  . allopurinol  300 mg Oral Daily  . amLODipine  10 mg Oral Daily  . atorvastatin  40 mg Oral Daily  . busPIRone  15 mg Oral BID  . clopidogrel  75 mg Oral q morning - 10a  . diclofenac sodium  2 g Topical QID  . doxazosin  4 mg Oral QPM  . DULoxetine  60 mg Oral Daily  . gabapentin  600 mg Oral BID  . Gerhardt's butt cream   Topical BID  . insulin aspart  0-9 Units Subcutaneous TID WC  . insulin glargine  25 Units Subcutaneous QHS  . lidocaine  1 patch Transdermal Q24H  . mouth  rinse  15 mL Mouth Rinse BID  . metoprolol succinate  150 mg Oral Daily  . polyethylene glycol  17 g Oral Daily  . Warfarin - Pharmacist Dosing Inpatient   Does not apply q1800   Continuous Infusions: . sodium chloride Stopped (02/19/18 1619)     LOS: 6 days     Marcellus Scott, MD, FACP, [facility]. [facility]  To contact the attending provider between 7A-7P or the covering provider during after hours 7P-7A, please log into the web site [email or web address] and access using universal [place] password for that web site. If you do not have the password, please call the hospital operator.  02/23/2018, 12:52 PM

## 2018-02-24 DIAGNOSIS — R338 Other retention of urine: Secondary | ICD-10-CM

## 2018-02-24 LAB — COMPREHENSIVE METABOLIC PANEL
ALT: 34 U/L (ref 0–44)
AST: 28 U/L (ref 15–41)
Albumin: 3 g/dL — ABNORMAL LOW (ref 3.5–5.0)
Alkaline Phosphatase: 50 U/L (ref 38–126)
Anion gap: 9 (ref 5–15)
BUN: 16 mg/dL (ref 8–23)
CO2: 27 mmol/L (ref 22–32)
Calcium: 8.5 mg/dL — ABNORMAL LOW (ref 8.9–10.3)
Chloride: 100 mmol/L (ref 98–111)
Creatinine, Ser: 1.35 mg/dL — ABNORMAL HIGH (ref 0.61–1.24)
GFR calc Af Amer: 59 mL/min — ABNORMAL LOW (ref >60)
GFR calc non Af Amer: 51 mL/min — ABNORMAL LOW (ref >60)
Glucose, Bld: 263 mg/dL — ABNORMAL HIGH (ref 70–99)
Potassium: 3.6 mmol/L (ref 3.5–5.1)
Sodium: 136 mmol/L (ref 135–145)
Total Bilirubin: 1.4 mg/dL — ABNORMAL HIGH (ref 0.3–1.2)
Total Protein: 6.3 g/dL — ABNORMAL LOW (ref 6.5–8.1)

## 2018-02-24 LAB — STREP PNEUMONIAE URINARY ANTIGEN: Strep Pneumo Urinary Antigen: NEGATIVE

## 2018-02-24 LAB — CBC
HCT: 33.6 % — ABNORMAL LOW (ref 39.0–52.0)
Hemoglobin: 10.7 g/dL — ABNORMAL LOW (ref 13.0–17.0)
MCH: 28.6 pg (ref 26.0–34.0)
MCHC: 31.8 g/dL (ref 30.0–36.0)
MCV: 89.8 fL (ref 80.0–100.0)
Platelets: 234 10*3/uL (ref 150–400)
RBC: 3.74 MIL/uL — ABNORMAL LOW (ref 4.22–5.81)
RDW: 14.8 % (ref 11.5–15.5)
WBC: 9.1 10*3/uL (ref 4.0–10.5)
nRBC: 0 % (ref 0.0–0.2)

## 2018-02-24 LAB — PROTIME-INR
INR: 2.09
Prothrombin Time: 23.2 seconds — ABNORMAL HIGH (ref 11.4–15.2)

## 2018-02-24 LAB — GLUCOSE, CAPILLARY
Glucose-Capillary: 262 mg/dL — ABNORMAL HIGH (ref 70–99)
Glucose-Capillary: 242 mg/dL — ABNORMAL HIGH (ref 70–99)
Glucose-Capillary: 220 mg/dL — ABNORMAL HIGH (ref 70–99)
Glucose-Capillary: 226 mg/dL — ABNORMAL HIGH (ref 70–99)

## 2018-02-24 MED ORDER — ACETAMINOPHEN 500 MG PO TABS
1000.0000 mg | ORAL_TABLET | Freq: Three times a day (TID) | ORAL | Status: DC
  Administered 2018-02-24 – 2018-02-27 (×10): 1000 mg via ORAL
  Filled 2018-02-24 (×10): qty 2

## 2018-02-24 MED ORDER — DULOXETINE HCL 20 MG PO CPEP
40.0000 mg | ORAL_CAPSULE | Freq: Every day | ORAL | Status: DC
  Administered 2018-02-25 – 2018-02-27 (×3): 40 mg via ORAL
  Filled 2018-02-24 (×3): qty 2

## 2018-02-24 MED ORDER — BUSPIRONE HCL 10 MG PO TABS
10.0000 mg | ORAL_TABLET | Freq: Two times a day (BID) | ORAL | Status: DC
  Administered 2018-02-24 – 2018-02-27 (×6): 10 mg via ORAL
  Filled 2018-02-24 (×6): qty 1

## 2018-02-24 MED ORDER — INSULIN ASPART 100 UNIT/ML ~~LOC~~ SOLN
0.0000 [IU] | Freq: Three times a day (TID) | SUBCUTANEOUS | Status: DC
  Administered 2018-02-24: 5 [IU] via SUBCUTANEOUS
  Administered 2018-02-24 (×2): 3 [IU] via SUBCUTANEOUS
  Administered 2018-02-25: 2 [IU] via SUBCUTANEOUS
  Administered 2018-02-25: 1 [IU] via SUBCUTANEOUS
  Administered 2018-02-25: 2 [IU] via SUBCUTANEOUS
  Administered 2018-02-26: 5 [IU] via SUBCUTANEOUS
  Administered 2018-02-27: 2 [IU] via SUBCUTANEOUS
  Administered 2018-02-27: 1 [IU] via SUBCUTANEOUS
  Administered 2018-02-27: 3 [IU] via SUBCUTANEOUS

## 2018-02-24 MED ORDER — WARFARIN SODIUM 2.5 MG PO TABS
2.5000 mg | ORAL_TABLET | Freq: Once | ORAL | Status: AC
  Administered 2018-02-24: 2.5 mg via ORAL
  Filled 2018-02-24: qty 1

## 2018-02-24 MED ORDER — INSULIN ASPART 100 UNIT/ML ~~LOC~~ SOLN
0.0000 [IU] | Freq: Every day | SUBCUTANEOUS | Status: DC
  Administered 2018-02-24: 2 [IU] via SUBCUTANEOUS
  Administered 2018-02-25: 3 [IU] via SUBCUTANEOUS

## 2018-02-24 NOTE — Progress Notes (Addendum)
PROGRESS NOTE   Paul Chen  WUJ:[id number]    DOB: 1940-04-08    DOA: 02/17/2018  PCP: Carmin Richmond, MD   I have briefly reviewed patients previous medical records in [facility].  Brief Narrative:  78 year old male with PMH of COPD, DM 2, CAD status post stent in 2010, morbid obesity, CHF, A. fib, toe amputation 02/15/2018 at [facility] who presented to [facility] 02/17/2018 after a fall.  He was noted to be hypoxic.  He was given morphine which led to hypotension, for which she was given IV fluids, which led to increasing respiratory distress and the need for noninvasive mechanical ventilatory support.  He was transferred to [facility] and found to be hypoxic and hypercarbic with respiratory acidosis.  He was intubated for mechanical ventilation on 1/18 and admitted to ICU by CCM.  Extubated 1/19.  CCM transferred over care to [facility] on 02/20/2018.  Hospital course complicated by acute encephalopathy, likely related to opioids- minimizing, left psoas hematoma with associated pain treating conservatively and acute urinary retention requiring reinsertion of Foley catheter.   Assessment & Plan:   Active Problems:   Respiratory failure (HCC)   Acute on chronic hypoxic and hypercarbic respiratory failure  Likely in the context of narcotic use for recent foot surgery complicating underlying COPD and OSA.  Extubated 1/19.  Minimize opioid and sedating medications.  Still at high risk for recurrent respiratory failure.  Hypoxia resolved.  Currently saturating in the high 90s-100 on room air.  Paroxysmal A. fib with RVR Completed Lovenox bridging.  INR therapeutic now.  Target for low end of goal due to left psoas hematoma.  Continue Toprol-XL 150 mg daily.  Currently in sinus rhythm.  CAD with DES to the proximal RCA 2010 EKG showed new onset ST depression in anterior lateral leads which were not there on January 16.  Cardiology felt this is demand ischemia.  TTE showed no wall motion  abnormalities and no further inpatient work-up planned.  Peak troponin I 0.49.  Cardiology may consider outpatient noninvasive stress test. As per cardiology, on monotherapy with Plavix due to CAD/PTCA and on Coumadin for A. fib, DVT and PE.  History of PE/DVT Completed Lovenox bridging.  INR therapeutic on Coumadin, target to low end of goal due to left psoas hematoma.  Hypokalemia:  Replaced.  Magnesium 1.7.   Essential hypertension Mildly uncontrolled.  Continue amlodipine 10 mg daily, doxazosin 4 mg every afternoon, Toprol-XL 150 mg daily.  Adjust medications as needed.  Acute kidney injury on stage III chronic kidney disease Baseline creatinine reportedly 1.2.  Creatinine 3.48 at presentation.  Creatinine down to 1.3.  Avoid nephrotoxic's.  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  CBGs fluctuating.  Had hypoglycemic range/60 mg/dL on morning of 9/56.  Suspect due to variable oral intake due to mental status changes.  Reduced Lantus from 25 to 20 units nightly.  SSI.  CBGs now mostly in the 200s.  Continue current regimen and consider increasing insulins if worsens.  S/P right second toe amputation Orthopedic sign off 1/24 appreciated.  WBAT RLE in flat postop shoe, daily dry dressing changes living Mepitel layer in place, postop outpatient follow-up with Dr. Victorino Dike.  Acute toxic metabolic encephalopathy Noted to be somnolent on 1/23, responded to Narcan.  Suspected due to opioids.  Discontinued IV Dilaudid and long-acting OxyContin.  Continued on significantly reduced dose oxycodone IR along with Neurontin and Cymbalta.  More alert and communicative today but still  lethargic.  Reduced BuSpar dose from 15mg  to 10 mg twice daily and Cymbalta from 60 mg to 40 mg daily.  Also on gabapentin.  Fall/left hip pain/left psoas hematoma Reportedly with history of chronic hip pain.  X-ray on admission did not show acute injury.  Seen by orthopedics and MRI of hip was ordered but unable to  complete because he was not able to lie still.  CT pelvis 1/23 showed possible left psoas muscle hemorrhage/hematoma measuring approximately 4 x 4 x 5 cm.  No acute fractures.  The hematoma may be the source of his pain.  Supportive treatment.  Increased scheduled Tylenol to 1 g 3 times daily.  If pain worsens or progressive anemia then may have to hold anticoagulation.  Acute urinary retention After removing Foley catheter couple days ago, developed recurrent acute urinary retention and Foley catheter was placed back on 1/24.  DC with Foley catheter to SNF and outpatient follow-up with urology in 2 weeks.  OSA Likely need CPAP at discharge.  Constipation Bowel regimen.  Having BMs.  Abnormal LFTs Unclear etiology.  Transaminitis has resolved.  Normocytic anemia Stable.  DVT prophylaxis: Anticoagulated on Coumadin. Code Status: Full Family Communication: None at bedside Disposition: To be determined, possibly SNF when medically improved, hopefully in the next 48 hours.   Consultants:  PCCM-signed off Cardiology-signed off Orthopedics-signed off.  Procedures:  Intubated/extubated. Foley catheter discontinued 1/23 In and out catheterization  Antimicrobials:  None   Subjective: Patient sitting up at edge of bed leaning on walker.  Reports that he had a rough night and was uncomfortable due to left flank/hip pain.  Reports that he did not sleep much.  ROS: As above were unable due to mental status changes.  Objective:  Vitals:   02/24/18 0255 02/24/18 0433 02/24/18 0824 02/24/18 1110  BP: (!) 160/72  (!) 166/61 (!) 131/57  Pulse: 65  67 64  Resp: (!) 23  (!) 23 (!) 23  Temp: (!) 97.2 F (36.2 C)  (!) 97.4 F (36.3 C) 98.3 F (36.8 C)  TempSrc: Oral  Oral Oral  SpO2: 98%  100% 100%  Weight:  111.3 kg    Height:        Examination:  General exam: Elderly male, moderately built and obese, sitting up at edge of bed leaning on a walker and does not appear in any  distress. Respiratory system: Clear to auscultation.  No increased work of breathing. Cardiovascular system: S1 & S2 heard, RRR. No JVD, murmurs, rubs, gallops or clicks. No pedal edema.  Telemetry personally reviewed: Sinus rhythm. Gastrointestinal system: Abdomen is nondistended, soft and nontender. No organomegaly or masses felt. Normal bowel sounds heard. Central nervous system: Alert and oriented to person, place, partly to time.  Follows simple instructions.  Mental status definitely better than yesterday but still appears lethargic. No focal neurological deficits. Extremities: Symmetric 5 x 5 power.  Healed left great toe amputation.  Right second toe amputation postop site clean without acute findings. Skin: Patchy area of bruise approximately tennis ball size over left flank, does not appear new Psychiatry: Judgement and insight impaired. Mood & affect unable to assess at this time.     Data Reviewed: I have personally reviewed following labs and imaging studies  CBC: Recent Labs  Lab 02/18/18 0232 02/19/18 0115 02/20/18 0401 02/23/18 0724 02/24/18 0221  WBC 11.0* 15.2* 13.9* 10.9* 9.1  HGB 10.7* 11.3* 11.5* 10.9* 10.7*  HCT 33.7* 35.5* 35.9* 33.2* 33.6*  MCV 94.4 93.9 92.3  91.2 89.8  PLT 179 209 229 231 234   Basic Metabolic Panel: Recent Labs  Lab 02/17/18 1416  02/19/18 0741 02/20/18 0401 02/21/18 0604 02/22/18 0211 02/24/18 0221  NA 142   < > 142 142 140 138 136  K 4.0   < > 3.5 3.0* 3.5 3.7 3.6  CL 105   < > 101 100 100 100 100  CO2 27   < > 24 32 31 31 27   GLUCOSE 215*   < > 218* 213* 241* 259* 263*  BUN 46*   < > 22 14 14 15 16   CREATININE 3.48*   < > 1.82* 1.46* 1.45* 1.46* 1.35*  CALCIUM 7.5*   < > 8.2* 8.4* 8.5* 8.3* 8.5*  MG 2.5*  --   --   --  1.7  --   --   PHOS 5.0*  --  3.1  --   --   --   --    < > = values in this interval not displayed.   Liver Function Tests: Recent Labs  Lab 02/17/18 1416 02/19/18 0741 02/24/18 0221  AST 148*  --  28    ALT 104*  --  34  ALKPHOS 56  --  50  BILITOT 1.0  --  1.4*  PROT 6.0*  --  6.3*  ALBUMIN 3.0* 3.0* 3.0*   Coagulation Profile: Recent Labs  Lab 02/20/18 0401 02/21/18 0604 02/22/18 0211 02/23/18 0724 02/24/18 0221  INR 1.25 1.31 1.63 2.09 2.09   Cardiac Enzymes: Recent Labs  Lab 02/18/18 0852 02/18/18 1414 02/18/18 2124  TROPONINI 1.27* 1.49* 0.83*   HbA1C: No results for input(s): HGBA1C in the last 72 hours. CBG: Recent Labs  Lab 02/23/18 1114 02/23/18 1612 02/23/18 2129 02/24/18 0807 02/24/18 1109  GLUCAP 177* 200* 269* 262* 242*    Recent Results (from the past 240 hour(s))  MRSA PCR Screening     Status: None   Collection Time: 02/17/18  1:03 PM  Result Value Ref Range Status   MRSA by PCR NEGATIVE NEGATIVE Final    Comment:        The GeneXpert MRSA Assay (FDA approved for NASAL specimens only), is one component of a comprehensive MRSA colonization surveillance program. It is not intended to diagnose MRSA infection nor to guide or monitor treatment for MRSA infections. Performed at [facility], [address] [place], [place] [address]   Urine culture     Status: None   Collection Time: 02/17/18  1:15 PM  Result Value Ref Range Status   Specimen Description URINE, RANDOM  Final   Special Requests NONE  Final   Culture   Final    NO GROWTH Performed at [facility], [address] [place], [place] [address]    Report Status 02/18/2018 FINAL  Final  Culture, blood (routine x 2)     Status: None   Collection Time: 02/17/18  2:20 PM  Result Value Ref Range Status   Specimen Description BLOOD RIGHT ANTECUBITAL  Final   Special Requests   Final    BOTTLES DRAWN AEROBIC ONLY Blood Culture adequate volume   Culture   Final    NO GROWTH 5 DAYS Performed at [facility], [address] [place], [place] [address]    Report Status 02/22/2018 FINAL  Final  Culture, blood (routine x 2)     Status: None   Collection Time:  02/17/18  2:26 PM  Result Value Ref  Range Status   Specimen Description BLOOD RIGHT HAND  Final   Special Requests   Final    BOTTLES DRAWN AEROBIC ONLY Blood Culture adequate volume   Culture   Final    NO GROWTH 5 DAYS Performed at [facility], [address], [place], [place] [address]    Report Status 02/22/2018 FINAL  Final         Radiology Studies: Ct Pelvis Wo Contrast  Result Date: 02/22/2018 CLINICAL DATA:  78 year old male with persistent LEFT hip pain following fall. Patient on anticoagulation. Recent PTCA. EXAM: CT PELVIS WITHOUT CONTRAST TECHNIQUE: Multidetector CT imaging of the pelvis was performed following the standard protocol without intravenous contrast. COMPARISON:  03/12/2009 CT FINDINGS: Urinary Tract: Gas in the bladder is noted probably related to recent catheterization but correlate with any signs of infection. Bowel:  No abnormalities identified of pelvic bowel loops. Vascular/Lymphatic: Aortic, iliac and femoral atherosclerotic calcification noted without evidence of aneurysm. Reproductive:  Unremarkable Other:  No ascites or pneumoperitoneum. Musculoskeletal: Ill-defined hyperdense prominence of the LEFT psoas muscle is noted, likely representing hemorrhage/hematoma. This area measures approximately 4 x 4 x 5 cm, but difficult to accurately measure due to adjacent lumbar spine surgical hardware artifact. No acute fracture is identified. Posterior/interbody fusion and decompression changes at L5-S1 noted. SI joint bony fusion noted. Mild degenerative changes within both hips identified. IMPRESSION: 1. Ill-defined hyperdense prominence of the LEFT psoas muscle likely representing hemorrhage/hematoma and measuring approximately 4 x 4 x 5 cm. 2. Bladder gas which may be related to recent catheterization but correlate with infection. 3. No evidence of acute fracture.  Lumbar spine surgical changes. 4.  Aortic Atherosclerosis (ICD10-I70.0). These results will be  called to the ordering clinician or representative by the Radiologist Assistant, and communication documented in the PACS or zVision Dashboard. Electronically Signed   By: Harmon Pier M.D.   On: 02/22/2018 20:31        Scheduled Meds: . acetaminophen  500 mg Oral TID  . allopurinol  300 mg Oral Daily  . amLODipine  10 mg Oral Daily  . atorvastatin  40 mg Oral Daily  . busPIRone  15 mg Oral BID  . clopidogrel  75 mg Oral q morning - 10a  . diclofenac sodium  2 g Topical QID  . doxazosin  4 mg Oral QPM  . DULoxetine  60 mg Oral Daily  . gabapentin  600 mg Oral BID  . Gerhardt's butt cream   Topical BID  . insulin aspart  0-5 Units Subcutaneous QHS  . insulin aspart  0-9 Units Subcutaneous TID WC  . insulin glargine  20 Units Subcutaneous QHS  . lidocaine  1 patch Transdermal Q24H  . mouth rinse  15 mL Mouth Rinse BID  . metoprolol succinate  150 mg Oral Daily  . polyethylene glycol  17 g Oral Daily  . warfarin  2.5 mg Oral ONCE-1800  . Warfarin - Pharmacist Dosing Inpatient   Does not apply q1800   Continuous Infusions: . sodium chloride Stopped (02/19/18 1619)     LOS: 7 days     Marcellus Scott, MD, FACP, [facility]. [facility]  To contact the attending provider between 7A-7P or the covering provider during after hours 7P-7A, please log into the web site [email or web address] and access using universal [place] password for that web site. If you do not have the password, please call the hospital operator.  02/24/2018, 12:38 PM

## 2018-02-24 NOTE — Progress Notes (Addendum)
ANTICOAGULATION CONSULT NOTE  Pharmacy Consult for warfarin Indication: atrial fibrillation and hx of DVT and PE   No Known Allergies  Vital Signs: Temp: 98.3 F (36.8 C) (01/25 1110) Temp Source: Oral (01/25 1110) BP: 131/57 (01/25 1110) Pulse Rate: 64 (01/25 1110)  Labs: Recent Labs    02/22/18 0211 02/23/18 0724 02/24/18 0221  HGB  --  10.9* 10.7*  HCT  --  33.2* 33.6*  PLT  --  231 234  LABPROT 19.2* 23.2* 23.2*  INR 1.63 2.09 2.09  CREATININE 1.46*  --  1.35*   Estimated Creatinine Clearance: 59 mL/min (A) (by C-G formula based on SCr of 1.35 mg/dL (H)).  Assessment: 82 yoM [facility] OSH after a fall with h/o Afib and PE and DVT on warfarin 2.5 mg daily PTA, recently held for 2nd toe amputation performed 1/16 (INR 1.16), discharged with BID enoxaparin bridge to therapeutic INR> readmitted for SOB. Pt has not been eating well per RN.   Pharmacy consulted to resume Lovenox/warfarin on 1/20. Lovenox was stopped yesterday. INR remains therapeutic at 2.09, despite not receiving warfarin last night (order was not entered). Patient with pain likely secondary to psoas hematoma, which is a bit darker in color today. Hb stable.    Goal of Therapy:  INR 2-3 Monitor platelets by anticoagulation protocol: Yes   Plan:  Warfarin 2.5 mg x1 Will target lower end of INR goal given psoas hematoma  Continue to monitor pain/size of hematoma Daily INR, CBC  Marcelino Freestone, PharmD PGY2 Cardiology Pharmacy Resident Please check AMION for all Pharmacist numbers by unit 02/24/2018 11:35 AM

## 2018-02-24 NOTE — Progress Notes (Signed)
CPAP offered to patient during 11pm rounds.  Pt in pain and refused at this time.  Stated that he would call if he changed his mind during the night.

## 2018-02-25 LAB — CBC
HCT: 33.9 % — ABNORMAL LOW (ref 39.0–52.0)
Hemoglobin: 11 g/dL — ABNORMAL LOW (ref 13.0–17.0)
MCH: 29.1 pg (ref 26.0–34.0)
MCHC: 32.4 g/dL (ref 30.0–36.0)
MCV: 89.7 fL (ref 80.0–100.0)
Platelets: 264 10*3/uL (ref 150–400)
RBC: 3.78 MIL/uL — ABNORMAL LOW (ref 4.22–5.81)
RDW: 15 % (ref 11.5–15.5)
WBC: 10.5 10*3/uL (ref 4.0–10.5)
nRBC: 0 % (ref 0.0–0.2)

## 2018-02-25 LAB — GLUCOSE, CAPILLARY
Glucose-Capillary: 163 mg/dL — ABNORMAL HIGH (ref 70–99)
Glucose-Capillary: 191 mg/dL — ABNORMAL HIGH (ref 70–99)
Glucose-Capillary: 148 mg/dL — ABNORMAL HIGH (ref 70–99)
Glucose-Capillary: 215 mg/dL — ABNORMAL HIGH (ref 70–99)

## 2018-02-25 LAB — PROTIME-INR
INR: 1.99
Prothrombin Time: 22.3 seconds — ABNORMAL HIGH (ref 11.4–15.2)

## 2018-02-25 MED ORDER — WARFARIN SODIUM 3 MG PO TABS
3.0000 mg | ORAL_TABLET | Freq: Once | ORAL | Status: AC
  Administered 2018-02-25: 3 mg via ORAL
  Filled 2018-02-25: qty 1

## 2018-02-25 MED ORDER — INSULIN GLARGINE 100 UNIT/ML ~~LOC~~ SOLN
25.0000 [IU] | Freq: Every day | SUBCUTANEOUS | Status: DC
  Administered 2018-02-25 – 2018-02-26 (×2): 25 [IU] via SUBCUTANEOUS
  Filled 2018-02-25 (×3): qty 0.25

## 2018-02-25 NOTE — Progress Notes (Signed)
ANTICOAGULATION CONSULT NOTE  Pharmacy Consult for warfarin Indication: atrial fibrillation and hx of DVT and PE   No Known Allergies  Vital Signs: Temp: 97.8 F (36.6 C) (01/26 0806) Temp Source: Oral (01/26 0806) BP: 192/70 (01/26 0806) Pulse Rate: 10 (01/26 0806)  Labs: Recent Labs    02/23/18 0724 02/24/18 0221 02/25/18 0222  HGB 10.9* 10.7* 11.0*  HCT 33.2* 33.6* 33.9*  PLT 231 234 264  LABPROT 23.2* 23.2* 22.3*  INR 2.09 2.09 1.99  CREATININE  --  1.35*  --    Estimated Creatinine Clearance: 58.4 mL/min (A) (by C-G formula based on SCr of 1.35 mg/dL (H)).  Assessment: 81 yoM [facility] from OSH after a fall with h/o Afib and PE and DVT on warfarin 2.5 mg daily PTA, recently held for 2nd toe amputation performed 1/16 (INR 1.16), discharged with BID enoxaparin bridge to therapeutic INR> readmitted for SOB. Pt has not been eating well per RN.   Pharmacy consulted to resume Lovenox/warfarin on 1/20. Lovenox was stopped yesterday. INR near therapeutic at 1.99, s/p warfarin 2.5 mg x1, patient missed dose 1/24 (order was not entered). Patient with continued pain secondary to psoas hematoma, no increase in size, color remains the same. Hb remains stable.    Goal of Therapy:  INR 2-3 Monitor platelets by anticoagulation protocol: Yes   Plan:  Warfarin 3 mg x1 Will target lower end of INR goal given psoas hematoma  Continue to monitor pain/size of hematoma Daily INR, CBC  Marcelino Freestone, PharmD PGY2 Cardiology Pharmacy Resident Please check AMION for all Pharmacist numbers by unit 02/25/2018 9:45 AM

## 2018-02-25 NOTE — Progress Notes (Signed)
PROGRESS NOTE   Paul Chen  ZOX:[id number]    DOB: 1940-05-02    DOA: 02/17/2018  PCP: Carmin Richmond, MD   I have briefly reviewed patients previous medical records in [facility].  Brief Narrative:  79 year old male with PMH of COPD, DM 2, CAD status post stent in 2010, morbid obesity, CHF, A. fib, toe amputation 02/15/2018 at [facility] who presented to [facility] 02/17/2018 after a fall.  He was noted to be hypoxic.  He was given morphine which led to hypotension, for which she was given IV fluids, which led to increasing respiratory distress and the need for noninvasive mechanical ventilatory support.  He was transferred to [facility] and found to be hypoxic and hypercarbic with respiratory acidosis.  He was intubated for mechanical ventilation on 1/18 and admitted to ICU by CCM.  Extubated 1/19.  CCM transferred over care to [facility] on 02/20/2018.  Hospital course complicated by acute encephalopathy, likely related to opioids- minimizing, left psoas hematoma with associated pain treating conservatively and acute urinary retention requiring reinsertion of Foley catheter.  Improving.   Assessment & Plan:   Active Problems:   Respiratory failure (HCC)   Acute on chronic hypoxic and hypercarbic respiratory failure  Likely in the context of narcotic use for recent foot surgery complicating underlying COPD and OSA.  Extubated 1/19.  Minimize opioid and sedating medications.  Still at high risk for recurrent respiratory failure.  Hypoxia resolved for a couple days now.  Paroxysmal A. fib with RVR Completed Lovenox bridging.  INR therapeutic now.  Target for low end of goal due to left psoas hematoma.  Continue Toprol-XL 150 mg daily.  Currently in sinus rhythm.  Stable  CAD with DES to the proximal RCA 2010 EKG showed new onset ST depression in anterior lateral leads which were not there on January 16.  Cardiology felt this is demand ischemia.  TTE showed no wall motion abnormalities  and no further inpatient work-up planned.  Peak troponin I 0.49.  Cardiology may consider outpatient noninvasive stress test. As per cardiology, on monotherapy with Plavix due to CAD/PTCA and on Coumadin for A. fib, DVT and PE.  History of PE/DVT Completed Lovenox bridging.  INR therapeutic on Coumadin, target to low end of goal due to left psoas hematoma.  INR slightly subtherapeutic at 1.99.  No Lovenox bridging.  Hypokalemia:  Replaced.  Magnesium 1.7.   Essential hypertension Mildly uncontrolled.  Continue amlodipine 10 mg daily, doxazosin 4 mg every afternoon, Toprol-XL 150 mg daily.  Adjust medications as needed.  Acute kidney injury on stage III chronic kidney disease Baseline creatinine reportedly 1.2.  Creatinine 3.48 at presentation.  Creatinine down to 1.3.  Avoid nephrotoxic's.  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  CBGs fluctuating.  Had hypoglycemic range/60 mg/dL on morning of 8/11.  Suspect due to variable oral intake due to mental status changes.  Reduced Lantus from 25 to 20 units nightly.  SSI.  Now that patient is alert, eating better, will increase Lantus back up to 25 units nightly and monitor closely.  S/P right second toe amputation Orthopedic sign off 1/24 appreciated.  WBAT RLE in flat postop shoe, daily dry dressing changes living Mepitel layer in place, postop outpatient follow-up with Dr. Victorino Dike.  Acute toxic metabolic encephalopathy Noted to be somnolent on 1/23, responded to Narcan.  Suspected due to opioids.  Discontinued IV Dilaudid and long-acting OxyContin.  Continued on significantly reduced dose oxycodone IR along  with Neurontin and Cymbalta.  More alert and communicative today but still lethargic.  Reduced BuSpar dose from 15mg  to 10 mg twice daily and Cymbalta from 60 mg to 40 mg daily.  Also on gabapentin.  Mental status much improved on 1/26.  Fall/left hip pain/left psoas hematoma Reportedly with history of chronic hip pain.  X-ray on admission  did not show acute injury.  Seen by orthopedics and MRI of hip was ordered but unable to complete because he was not able to lie still.  CT pelvis 1/23 showed possible left psoas muscle hemorrhage/hematoma measuring approximately 4 x 4 x 5 cm.  No acute fractures.  The hematoma may be the source of his pain.  Supportive treatment.  Increased scheduled Tylenol to 1 g 3 times daily.  If pain worsens or progressive anemia then may have to hold anticoagulation.  Pain seems to be better controlled now.  Acute urinary retention After removing Foley catheter couple days ago, developed recurrent acute urinary retention and Foley catheter was placed back on 1/24.  DC with Foley catheter to SNF and outpatient follow-up with urology in 2 weeks.  OSA Likely need CPAP at discharge.  Constipation Bowel regimen.  Having BMs.  Abnormal LFTs Unclear etiology.  Transaminitis has resolved.  Normocytic anemia Stable.  DVT prophylaxis: Anticoagulated on Coumadin. Code Status: Full Family Communication: None at bedside Disposition: To be determined, possibly SNF when medically improved, hopefully 1/27 pending bed availability.   Consultants:  PCCM-signed off Cardiology-signed off Orthopedics-signed off.  Procedures:  Intubated/extubated. Foley catheter discontinued 1/23 In and out catheterization  Antimicrobials:  None   Subjective: Patient interviewed and examined along with RN.  Sitting up in bed eating breakfast by himself.  No specific complaints reported.  In fact he did not even report pain.  As per RN, current regimen controlling pain adequately.  ROS: As above, otherwise negative.  Having BMs.  Objective:  Vitals:   02/24/18 1921 02/25/18 0300 02/25/18 0500 02/25/18 0806  BP: (!) 152/68   (!) 192/70  Pulse: 66 65  (!) 10  Resp: (!) 23 (!) 23  18  Temp: 98.3 F (36.8 C)   97.8 F (36.6 C)  TempSrc: Oral   Oral  SpO2: 99% 94%  97%  Weight:   109 kg   Height:         Examination:  General exam: Elderly male, moderately built and obese, sitting up in bed eating breakfast by himself.  Looks much improved compared to last 2 days. Respiratory system: Clear to auscultation.  No increased work of breathing.  Stable. Cardiovascular system: S1 & S2 heard, RRR. No JVD, murmurs, rubs, gallops or clicks. No pedal edema.  Telemetry personally reviewed: Sinus rhythm.  Stable. Gastrointestinal system: Abdomen is nondistended, soft and nontender. No organomegaly or masses felt. Normal bowel sounds heard.  Stable. Central nervous system: Alert and oriented.  No focal neurological deficits. Extremities: Symmetric 5 x 5 power.  Healed left great toe amputation.  Right second toe amputation postop site clean without acute findings. Skin: Patchy area of bruise approximately tennis ball size over left flank, does not appear new and does not seem to be increasing in size Psychiatry: Judgement and insight impaired. Mood & affect pleasant and appropriate.    Data Reviewed: I have personally reviewed following labs and imaging studies  CBC: Recent Labs  Lab 02/19/18 0115 02/20/18 0401 02/23/18 0724 02/24/18 0221 02/25/18 0222  WBC 15.2* 13.9* 10.9* 9.1 10.5  HGB 11.3* 11.5* 10.9*  10.7* 11.0*  HCT 35.5* 35.9* 33.2* 33.6* 33.9*  MCV 93.9 92.3 91.2 89.8 89.7  PLT 209 229 231 234 264   Basic Metabolic Panel: Recent Labs  Lab 02/19/18 0741 02/20/18 0401 02/21/18 0604 02/22/18 0211 02/24/18 0221  NA 142 142 140 138 136  K 3.5 3.0* 3.5 3.7 3.6  CL 101 100 100 100 100  CO2 24 32 31 31 27   GLUCOSE 218* 213* 241* 259* 263*  BUN 22 14 14 15 16   CREATININE 1.82* 1.46* 1.45* 1.46* 1.35*  CALCIUM 8.2* 8.4* 8.5* 8.3* 8.5*  MG  --   --  1.7  --   --   PHOS 3.1  --   --   --   --    Liver Function Tests: Recent Labs  Lab 02/19/18 0741 02/24/18 0221  AST  --  28  ALT  --  34  ALKPHOS  --  50  BILITOT  --  1.4*  PROT  --  6.3*  ALBUMIN 3.0* 3.0*    Coagulation Profile: Recent Labs  Lab 02/21/18 0604 02/22/18 0211 02/23/18 0724 02/24/18 0221 02/25/18 0222  INR 1.31 1.63 2.09 2.09 1.99   Cardiac Enzymes: Recent Labs  Lab 02/18/18 1414 02/18/18 2124  TROPONINI 1.49* 0.83*   HbA1C: No results for input(s): HGBA1C in the last 72 hours. CBG: Recent Labs  Lab 02/24/18 0807 02/24/18 1109 02/24/18 1634 02/24/18 2123 02/25/18 0803  GLUCAP 262* 242* 220* 226* 163*    Recent Results (from the past 240 hour(s))  MRSA PCR Screening     Status: None   Collection Time: 02/17/18  1:03 PM  Result Value Ref Range Status   MRSA by PCR NEGATIVE NEGATIVE Final    Comment:        The GeneXpert MRSA Assay (FDA approved for NASAL specimens only), is one component of a comprehensive MRSA colonization surveillance program. It is not intended to diagnose MRSA infection nor to guide or monitor treatment for MRSA infections. Performed at [facility], [address] [place], [place] [address]   Urine culture     Status: None   Collection Time: 02/17/18  1:15 PM  Result Value Ref Range Status   Specimen Description URINE, RANDOM  Final   Special Requests NONE  Final   Culture   Final    NO GROWTH Performed at [facility], [address] [place], [place] [address]    Report Status 02/18/2018 FINAL  Final  Culture, blood (routine x 2)     Status: None   Collection Time: 02/17/18  2:20 PM  Result Value Ref Range Status   Specimen Description BLOOD RIGHT ANTECUBITAL  Final   Special Requests   Final    BOTTLES DRAWN AEROBIC ONLY Blood Culture adequate volume   Culture   Final    NO GROWTH 5 DAYS Performed at [facility], [address] [place], [place] [address]    Report Status 02/22/2018 FINAL  Final  Culture, blood (routine x 2)     Status: None   Collection Time: 02/17/18  2:26 PM  Result Value Ref Range Status   Specimen Description BLOOD RIGHT HAND  Final   Special Requests   Final     BOTTLES DRAWN AEROBIC ONLY Blood Culture adequate volume   Culture   Final    NO GROWTH 5 DAYS Performed at [facility], [address] [place], [place] [address]    Report Status  02/22/2018 FINAL  Final         Radiology Studies: No results found.      Scheduled Meds: . acetaminophen  1,000 mg Oral TID  . allopurinol  300 mg Oral Daily  . amLODipine  10 mg Oral Daily  . atorvastatin  40 mg Oral Daily  . busPIRone  10 mg Oral BID  . clopidogrel  75 mg Oral q morning - 10a  . diclofenac sodium  2 g Topical QID  . doxazosin  4 mg Oral QPM  . DULoxetine  40 mg Oral Daily  . gabapentin  600 mg Oral BID  . Gerhardt's butt cream   Topical BID  . insulin aspart  0-5 Units Subcutaneous QHS  . insulin aspart  0-9 Units Subcutaneous TID WC  . insulin glargine  20 Units Subcutaneous QHS  . lidocaine  1 patch Transdermal Q24H  . mouth rinse  15 mL Mouth Rinse BID  . metoprolol succinate  150 mg Oral Daily  . polyethylene glycol  17 g Oral Daily  . warfarin  3 mg Oral ONCE-1800  . Warfarin - Pharmacist Dosing Inpatient   Does not apply q1800   Continuous Infusions: . sodium chloride Stopped (02/19/18 1619)     LOS: 8 days     Marcellus Scott, MD, FACP, [facility]. [facility]  To contact the attending provider between 7A-7P or the covering provider during after hours 7P-7A, please log into the web site [email or web address] and access using universal [place] password for that web site. If you do not have the password, please call the hospital operator.  02/25/2018, 9:59 AM

## 2018-02-26 LAB — CBC
HCT: 35.6 % — ABNORMAL LOW (ref 39.0–52.0)
Hemoglobin: 11.4 g/dL — ABNORMAL LOW (ref 13.0–17.0)
MCH: 28.6 pg (ref 26.0–34.0)
MCHC: 32 g/dL (ref 30.0–36.0)
MCV: 89.2 fL (ref 80.0–100.0)
Platelets: 293 10*3/uL (ref 150–400)
RBC: 3.99 MIL/uL — ABNORMAL LOW (ref 4.22–5.81)
RDW: 14.8 % (ref 11.5–15.5)
WBC: 11.3 10*3/uL — ABNORMAL HIGH (ref 4.0–10.5)
nRBC: 0 % (ref 0.0–0.2)

## 2018-02-26 LAB — GLUCOSE, CAPILLARY
Glucose-Capillary: 115 mg/dL — ABNORMAL HIGH (ref 70–99)
Glucose-Capillary: 202 mg/dL — ABNORMAL HIGH (ref 70–99)
Glucose-Capillary: 256 mg/dL — ABNORMAL HIGH (ref 70–99)
Glucose-Capillary: 159 mg/dL — ABNORMAL HIGH (ref 70–99)

## 2018-02-26 LAB — URINE CULTURE: Culture: NO GROWTH

## 2018-02-26 LAB — PROTIME-INR
INR: 2.1
Prothrombin Time: 23.2 seconds — ABNORMAL HIGH (ref 11.4–15.2)

## 2018-02-26 MED ORDER — WARFARIN SODIUM 2.5 MG PO TABS
2.5000 mg | ORAL_TABLET | Freq: Once | ORAL | Status: AC
  Administered 2018-02-26: 2.5 mg via ORAL
  Filled 2018-02-26: qty 1

## 2018-02-26 NOTE — Progress Notes (Signed)
ANTICOAGULATION CONSULT NOTE  Pharmacy Consult for warfarin Indication: atrial fibrillation and hx of DVT and PE   No Known Allergies  Vital Signs: Temp: 97.6 F (36.4 C) (01/27 0506) Temp Source: Oral (01/27 0506) BP: 167/64 (01/27 0506) Pulse Rate: 60 (01/27 0506)  Labs: Recent Labs    02/24/18 0221 02/25/18 0222 02/26/18 0217  HGB 10.7* 11.0* 11.4*  HCT 33.6* 33.9* 35.6*  PLT 234 264 293  LABPROT 23.2* 22.3* 23.2*  INR 2.09 1.99 2.10  CREATININE 1.35*  --   --    Estimated Creatinine Clearance: 58.6 mL/min (A) (by C-G formula based on SCr of 1.35 mg/dL (H)).  Assessment: 78 yoM presenting to [facility] from OSH after a fall with h/o Afib and PE and DVT on warfarin 2.5 mg daily PTA, recently held for 2nd toe amputation performed 1/16 (INR 1.16), discharged with BID enoxaparin bridge to therapeutic INR> readmitted for SOB. Pt has not been eating well per RN.   Pharmacy consulted to resume Lovenox/warfarin on 1/20. Lovenox was stopped 1/24. INR therapeutic at 2.1, s/p warfarin 2.5 mg x1, patient missed dose 1/24 (order was not entered). Patient with continued pain secondary to psoas hematoma, no increase in size, color remains the same. Hb remains stable.    Goal of Therapy:  INR 2-3 Monitor platelets by anticoagulation protocol: Yes   Plan:  Warfarin 2.5 mg x1 Will target lower end of INR goal given psoas hematoma  Continue to monitor pain/size of hematoma Daily INR, CBC  Reece Leader, [place], [facility] Clinical Pharmacist Phone [phone]  02/26/2018 2:11 PM

## 2018-02-26 NOTE — Care Management Important Message (Signed)
Important Message  Patient Details  Name: Paul Chen MRN: [id number] Date of Birth: [birth year]   Medicare Important Message Given:  Yes    Oralia Rud Dillen Belmontes 02/26/2018, 12:07 PM

## 2018-02-26 NOTE — Progress Notes (Signed)
PROGRESS NOTE   Paul Chen  BJY:[id number]    DOB: Feb 01, 1940    DOA: 02/17/2018  PCP: Carmin Richmondavis, James W, MD   I have briefly reviewed patients previous medical records in [facility].  Brief Narrative:  78 year old male with PMH of COPD, DM 2, CAD status post stent in 2010, morbid obesity, CHF, A. fib, toe amputation 02/15/2018 at [facility] who presented to [facility] 02/17/2018 after a fall.  He was noted to be hypoxic.  He was given morphine which led to hypotension, for which she was given IV fluids, which led to increasing respiratory distress and the need for noninvasive mechanical ventilatory support.  He was [facility] and found to be hypoxic and hypercarbic with respiratory acidosis.  He was intubated for mechanical ventilation on 1/18 and admitted to ICU by CCM.  Extubated 1/19.  CCM transferred [facility].  Hospital course complicated by acute encephalopathy, likely related to opioids- minimizing, left psoas hematoma with associated pain treating conservatively and acute urinary retention requiring reinsertion of Foley catheter.  Stable.  Awaiting SNF bed for discharge.   Assessment & Plan:   Active Problems:   Respiratory failure (HCC)   Acute on chronic hypoxic and hypercarbic respiratory failure  Likely in the context of narcotic use for recent foot surgery complicating underlying COPD and OSA.  Extubated 1/19.  Minimize opioid and sedating medications.  Still at high risk for recurrent respiratory failure.  Hypoxia resolved for a couple days now.  Stable.  Paroxysmal A. fib with RVR Completed Lovenox bridging.  INR therapeutic now.  Target for low end of goal due to left psoas hematoma.  Continue Toprol-XL 150 mg daily.  Currently in sinus rhythm.  Stable.  CAD with DES to the proximal RCA 2010 EKG showed new onset ST depression in anterior lateral leads which were not there on January 16.  Cardiology felt this is demand ischemia.   TTE showed no wall motion abnormalities and no further inpatient work-up planned.  Peak troponin I 0.49.  Cardiology may consider outpatient noninvasive stress test. As per cardiology, on monotherapy with Plavix due to CAD/PTCA and on Coumadin for A. fib, DVT and PE.  History of PE/DVT Completed Lovenox bridging.  INR therapeutic on Coumadin, target to low end of goal due to left psoas hematoma.  INR therapeutic at 2.1 today.    Hypokalemia:  Replaced.  Magnesium 1.7.   Essential hypertension Mildly uncontrolled.  Continue amlodipine 10 mg daily, doxazosin 4 mg every afternoon, Toprol-XL 150 mg daily.  Adjust medications as needed.  No changes today.  Acute kidney injury on stage III chronic kidney disease Baseline creatinine reportedly 1.2.  Creatinine 3.48 at presentation.  Creatinine down to 1.3.  Avoid nephrotoxic's.  Follow BMPs closely as outpatient.  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  CBGs fluctuating.  Had hypoglycemic range/60 mg/dL on morning of 0/861/24.  Suspect due to variable oral intake due to mental status changes.  Reduced Lantus from 25 to 20 units nightly.  SSI.  Now that patient is alert, eating better, increased Lantus back up to 25 units nightly and monitor closely.  CBGs overall better, mildly uncontrolled with mild fluctuations at times.  S/P right second toe amputation Orthopedic sign off 1/24 appreciated.  WBAT RLE in flat postop shoe, daily dry dressing changes living Mepitel layer in place, postop outpatient follow-up with [name].  Acute toxic metabolic encephalopathy Noted to be somnolent  on 1/23, responded to Narcan.  Suspected due to opioids.  Discontinued IV Dilaudid and long-acting OxyContin.  Continued on significantly reduced dose oxycodone IR along with Neurontin and Cymbalta.  More alert and communicative today but still lethargic.  Reduced BuSpar dose from 15mg  to 10 mg twice daily and Cymbalta from 60 mg to 40 mg daily.  Also on gabapentin.  Acute  encephalopathy possibly resolved now.  Fall/left hip pain/left psoas hematoma Reportedly with history of chronic hip pain.  X-ray on admission did not show acute injury.  Seen by orthopedics and MRI of hip was ordered but unable to complete because he was not able to lie still.  CT pelvis 1/23 showed possible left psoas muscle hemorrhage/hematoma measuring approximately 4 x 4 x 5 cm.  No acute fractures.  The hematoma may be the source of his pain.  Supportive treatment.  Increased scheduled Tylenol to 1 g 3 times daily.  If pain worsens or progressive anemia then may have to hold anticoagulation.  Pain much better controlled.  Acute urinary retention After removing Foley catheter couple days ago, developed recurrent acute urinary retention and Foley catheter was placed back on 1/24.  DC with Foley catheter to SNF and outpatient follow-up with urology in 2 weeks.  OSA Likely need CPAP at discharge.  Constipation Bowel regimen.  Having BMs.  Abnormal LFTs Unclear etiology.  Transaminitis has resolved.  Normocytic anemia Stable.  DVT prophylaxis: Anticoagulated on Coumadin. Code Status: Full Family Communication: None at bedside Disposition: DC SNF pending bed availability.   Consultants:  PCCM-signed off Cardiology-signed off Orthopedics-signed off.  Procedures:  Intubated/extubated. Foley catheter discontinued 1/23 In and out catheterization  Antimicrobials:  None   Subjective: Patient interviewed and examined with RN.  Denies complaints.  Specifically no pain reported.  States that he had lower extremity DVT and PE almost 3 years ago.  Also notes that he follows with Dr. [place], Cardiology.  ROS: As above, otherwise negative.  Having BMs.  Objective:  Vitals:   02/25/18 1934 02/25/18 2356 02/25/18 2357 02/26/18 0506  BP: (!) 154/67 (!) 141/61 (!) 141/61 (!) 167/64  Pulse: 62   60  Resp:      Temp: 97.7 F (36.5 C) 97.9 F (36.6 C)  97.6 F (36.4 C)  TempSrc:  Oral Oral  Oral  SpO2: 100%   96%  Weight:    109.4 kg  Height:        Examination:  General exam: Elderly male, moderately built and obese, lying comfortably propped up in bed. Respiratory system: Clear to auscultation.  No increased work of breathing.  Stable. Cardiovascular system: S1 & S2 heard, RRR. No JVD, murmurs, rubs, gallops or clicks. No pedal edema.  Stable. Gastrointestinal system: Abdomen is nondistended, soft and nontender. No organomegaly or masses felt. Normal bowel sounds heard.  Stable. Central nervous system: Alert and oriented.  No focal neurological deficits. Extremities: Symmetric 5 x 5 power.  Healed left great toe amputation.  Right second toe amputation postop site clean without acute findings. Skin: Patchy area of bruise approximately tennis ball size over left flank, does not appear new and does not seem to be increasing in size Psychiatry: Judgement and insight impaired. Mood & affect pleasant and appropriate.  Stable    Data Reviewed: I have personally reviewed following labs and imaging studies  CBC: Recent Labs  Lab 02/20/18 0401 02/23/18 0724 02/24/18 0221 02/25/18 0222 02/26/18 0217  WBC 13.9* 10.9* 9.1 10.5 11.3*  HGB 11.5* 10.9* 10.7*  11.0* 11.4*  HCT 35.9* 33.2* 33.6* 33.9* 35.6*  MCV 92.3 91.2 89.8 89.7 89.2  PLT 229 231 234 264 293   Basic Metabolic Panel: Recent Labs  Lab 02/20/18 0401 02/21/18 0604 02/22/18 0211 02/24/18 0221  NA 142 140 138 136  K 3.0* 3.5 3.7 3.6  CL 100 100 100 100  CO2 32 31 31 27   GLUCOSE 213* 241* 259* 263*  BUN 14 14 15 16   CREATININE 1.46* 1.45* 1.46* 1.35*  CALCIUM 8.4* 8.5* 8.3* 8.5*  MG  --  1.7  --   --    Liver Function Tests: Recent Labs  Lab 02/24/18 0221  AST 28  ALT 34  ALKPHOS 50  BILITOT 1.4*  PROT 6.3*  ALBUMIN 3.0*   Coagulation Profile: Recent Labs  Lab 02/22/18 0211 02/23/18 0724 02/24/18 0221 02/25/18 0222 02/26/18 0217  INR 1.63 2.09 2.09 1.99 2.10   Cardiac  Enzymes: No results for input(s): CKTOTAL, CKMB, CKMBINDEX, TROPONINI in the last 168 hours. HbA1C: No results for input(s): HGBA1C in the last 72 hours. CBG: Recent Labs  Lab 02/25/18 0803 02/25/18 1220 02/25/18 1621 02/25/18 2127 02/26/18 0731  GLUCAP 163* 191* 148* 215* 115*    Recent Results (from the past 240 hour(s))  MRSA PCR Screening     Status: None   Collection Time: 02/17/18  1:03 PM  Result Value Ref Range Status   MRSA by PCR NEGATIVE NEGATIVE Final    Comment:        The GeneXpert MRSA Assay (FDA approved for NASAL specimens only), is one component of a comprehensive MRSA colonization surveillance program. It is not intended to diagnose MRSA infection nor to guide or monitor treatment for MRSA infections. Performed at [facility], [address], [place], [place] [address]   Urine culture     Status: None   Collection Time: 02/17/18  1:15 PM  Result Value Ref Range Status   Specimen Description URINE, RANDOM  Final   Special Requests NONE  Final   Culture   Final    NO GROWTH Performed at [facility], [address], [place], [place] [address]    Report Status 02/18/2018 FINAL  Final  Culture, blood (routine x 2)     Status: None   Collection Time: 02/17/18  2:20 PM  Result Value Ref Range Status   Specimen Description BLOOD RIGHT ANTECUBITAL  Final   Special Requests   Final    BOTTLES DRAWN AEROBIC ONLY Blood Culture adequate volume   Culture   Final    NO GROWTH 5 DAYS Performed at [facility], [address], [place], [place] [address]    Report Status 02/22/2018 FINAL  Final  Culture, blood (routine x 2)     Status: None   Collection Time: 02/17/18  2:26 PM  Result Value Ref Range Status   Specimen Description BLOOD RIGHT HAND  Final   Special Requests   Final    BOTTLES DRAWN AEROBIC ONLY Blood Culture adequate volume   Culture   Final    NO GROWTH 5 DAYS Performed at [facility], [address],  [place], [place] [address]    Report Status 02/22/2018 FINAL  Final         Radiology Studies: No results found.      Scheduled Meds: . acetaminophen  1,000 mg Oral TID  . allopurinol  300 mg Oral Daily  . amLODipine  10 mg Oral Daily  .  atorvastatin  40 mg Oral Daily  . busPIRone  10 mg Oral BID  . clopidogrel  75 mg Oral q morning - 10a  . diclofenac sodium  2 g Topical QID  . doxazosin  4 mg Oral QPM  . DULoxetine  40 mg Oral Daily  . gabapentin  600 mg Oral BID  . Gerhardt's butt cream   Topical BID  . insulin aspart  0-5 Units Subcutaneous QHS  . insulin aspart  0-9 Units Subcutaneous TID WC  . insulin glargine  25 Units Subcutaneous QHS  . lidocaine  1 patch Transdermal Q24H  . mouth rinse  15 mL Mouth Rinse BID  . metoprolol succinate  150 mg Oral Daily  . polyethylene glycol  17 g Oral Daily  . Warfarin - Pharmacist Dosing Inpatient   Does not apply q1800   Continuous Infusions: . sodium chloride Stopped (02/19/18 1619)     LOS: 9 days     Marcellus ScottAnand Yuriy Cui, MD, FACP, [facility]. [facility]  To contact the attending provider between 7A-7P or the covering provider during after hours 7P-7A, please log into the web site [email or web address] and access using universal [place] password for that web site. If you do not have the password, please call the hospital operator.  02/26/2018, 9:11 AM

## 2018-02-26 NOTE — Progress Notes (Signed)
Physical Therapy Treatment Patient Details Name: Paul Chen MRN: [id number] DOB: 06/12/1940 Today's Date: 02/26/2018    History of Present Illness 78 yo male s/p R great toe amputation ( WBAT in postop shoe) 2 days prior to presentation to hospital for respiratory failure on 1/18; Extubated 1/19;  Extreme difficulty getting comfortable, limited by pain form long-standing L hip arthritis. PMH including Arthritis, Chronic renal insufficiency, CKD stage III, COPD, Coronary disease (October 2010), Depression, Diabetes mellitus, Diastolic dysfunction, Dyspnea, Gout, History of blood transfusion, History of diabetic ulcer of foot, HTN, Hypoventilation, Obesity, Pneumonia, Pulmonary embolism (March 2010), and Sleep apnea.    PT Comments    Patient seen for activity progression, received OOB in chair, tolerated mobility with use of RW. Remains limited with hip pain. Current POC remains appropriate. Noted improvements in cognition and arousal this session.   Follow Up Recommendations  SNF     Equipment Recommendations  Other (comment)(To be determined at next venue)    Recommendations for Other Services       Precautions / Restrictions Precautions Precautions: Fall Required Braces or Orthoses: Other Brace Other Brace: Post op shoe; WBAT in postop shoe per Ortho Consult note Restrictions Weight Bearing Restrictions: No RLE Weight Bearing: Weight bearing as tolerated    Mobility  Bed Mobility               General bed mobility comments: received OOB  Transfers Overall transfer level: Needs assistance Equipment used: Rolling walker (2 wheeled) Transfers: Sit to/from Stand Sit to Stand: Min assist         General transfer comment: min assist for stability and control descent to chair  Ambulation/Gait Ambulation/Gait assistance: Min assist Gait Distance (Feet): 18 Feet Assistive device: Rolling walker (2 wheeled) Gait Pattern/deviations: Shuffle;Antalgic Gait velocity:  decreased   General Gait Details: Vcs for upright posture   Stairs             Wheelchair Mobility    Modified Rankin (Stroke Patients Only)       Balance     Sitting balance-Leahy Scale: Good       Standing balance-Leahy Scale: Fair                              Cognition Arousal/Alertness: Awake/alert Behavior During Therapy: WFL for tasks assessed/performed Overall Cognitive Status: Within Functional Limits for tasks assessed                                 General Comments: significant improvements in cognition and arousal this session      Exercises      General Comments        Pertinent Vitals/Pain Pain Assessment: Faces Pain Score: 7  Pain Location: left hip Pain Descriptors / Indicators: Grimacing;Sore Pain Intervention(s): Monitored during session    Home Living                      Prior Function            PT Goals (current goals can now be found in the care plan section) Acute Rehab PT Goals Patient Stated Goal: to get home PT Goal Formulation: Patient unable to participate in goal setting Time For Goal Achievement: 03/05/18 Potential to Achieve Goals: Fair Progress towards PT goals: Progressing toward goals    Frequency    Min 2X/week  PT Plan Current plan remains appropriate    Co-evaluation              AM-PAC PT "6 Clicks" Mobility   Outcome Measure  Help needed turning from your back to your side while in a flat bed without using bedrails?: A Little Help needed moving from lying on your back to sitting on the side of a flat bed without using bedrails?: A Little Help needed moving to and from a bed to a chair (including a wheelchair)?: A Little Help needed standing up from a chair using your arms (e.g., wheelchair or bedside chair)?: A Little Help needed to walk in hospital room?: A Little Help needed climbing 3-5 steps with a railing? : A Lot 6 Click Score: 17    End  of Session Equipment Utilized During Treatment: Other (comment)(post op show) Activity Tolerance: Patient tolerated treatment well Patient left: in chair;with call bell/phone within reach Nurse Communication: Mobility status PT Visit Diagnosis: Other abnormalities of gait and mobility (R26.89);Pain Pain - Right/Left: Left Pain - part of body: Hip     Time: 1222-1237 PT Time Calculation (min) (ACUTE ONLY): 15 min  Charges:  $Therapeutic Activity: 8-22 mins                     [name], PT DPT  Board Certified Neurologic Specialist Acute Rehabilitation Services Pager [phone] Office [phone]    [name] 02/26/2018, 12:59 PM

## 2018-02-26 NOTE — Clinical Social Work Note (Addendum)
Patient will discharge to [facility] once insurance authorization obtained. Patient and daughter aware and agreeable. The SNF clinical liaison is starting it today. She stated patient will need to participate in therapy if insurance is to approve SNF. Will need updated therapy notes. Last one is from 1/23. Uploaded requested documents into [place] [employer] for PASARR review.   Charlynn Court, CSW [phone]  12:43 pm PASARR approved: [id number]. Expires 2/26.  Charlynn Court, CSW [phone]  2:41 pm SNF asked if patient would need a CPAP machine at the facility. Notified them that patient has not used CPAP in almost a week.  Charlynn Court, CSW [phone]

## 2018-02-26 NOTE — Progress Notes (Signed)
Pt tolerating standing x 2 minutes at sink for oral care. Manipulating containers independently during eating despite B tremor, pt states is longstanding. Pt typically uses AD for LB dressing at home. He is eager to get more independent and return home with his sister.  02/26/18 1300  OT Visit Information  Last OT Received On 02/26/18  Assistance Needed +1  History of Present Illness 78 yo male s/p R great toe amputation ( WBAT in postop shoe) 2 days prior to presentation to hospital for respiratory failure on 1/18; Extubated 1/19;  Extreme difficulty getting comfortable, limited by pain form long-standing L hip arthritis. PMH including Arthritis, Chronic renal insufficiency, CKD stage III, COPD, Coronary disease (October 2010), Depression, Diabetes mellitus, Diastolic dysfunction, Dyspnea, Gout, History of blood transfusion, History of diabetic ulcer of foot, HTN, Hypoventilation, Obesity, Pneumonia, Pulmonary embolism (March 2010), and Sleep apnea.  Precautions  Precautions Fall  Required Braces or Orthoses Other Brace  Other Brace Post op shoe; WBAT in postop shoe per Ortho Consult note  Pain Assessment  Pain Assessment 0-10  Pain Score 7  Pain Location left hip  Pain Descriptors / Indicators Grimacing;Sore  Pain Intervention(s) Monitored during session;Repositioned  Cognition  Arousal/Alertness Awake/alert  Behavior During Therapy WFL for tasks assessed/performed  Overall Cognitive Status Within Functional Limits for tasks assessed  General Comments significant improvements in cognition and arousal this session  ADL  Overall ADL's  Needs assistance/impaired  Eating/Feeding Independent;Sitting  Eating/Feeding Details (indicate cue type and reason) able to open containers and pour beverages  Grooming Oral care;Min guard;Standing  Grooming Details (indicate cue type and reason) leans on sink with elbows  Upper Body Dressing  Set up;Sitting  Upper Body Dressing Details (indicate cue type  and reason) front opening gown  Lower Body Dressing Maximal assistance;Sitting/lateral leans  Lower Body Dressing Details (indicate cue type and reason) pt typically uses AE for LB dressing  Toilet Transfer Minimal assistance;Ambulation  Functional mobility during ADLs Minimal assistance;Rolling walker  Bed Mobility  General bed mobility comments received OOB  Balance  Overall balance assessment Needs assistance  Sitting balance-Leahy Scale Good  Standing balance-Leahy Scale Fair  Standing balance comment Reliant on UE support  Restrictions  RLE Weight Bearing WBAT  Other Position/Activity Restrictions post-op shoe  Transfers  Overall transfer level Needs assistance  Equipment used Rolling walker (2 wheeled)  Transfers Sit to/from Stand  Sit to Stand Min guard;Min assist  General transfer comment min assist for stability and control descent to chair  OT - End of Session  Equipment Utilized During Treatment Gait belt;Rolling walker  Activity Tolerance Patient tolerated treatment well  Patient left in chair;with call bell/phone within reach  Nurse Communication Mobility status  OT Assessment/Plan  OT Plan Discharge plan remains appropriate  OT Visit Diagnosis Unsteadiness on feet (R26.81);Other abnormalities of gait and mobility (R26.89);Muscle weakness (generalized) (M62.81);Other symptoms and signs involving cognitive function;Pain  Pain - Right/Left Left  Pain - part of body Hip  OT Frequency (ACUTE ONLY) Min 2X/week  Follow Up Recommendations SNF;Supervision/Assistance - 24 hour  AM-PAC OT "6 Clicks" Daily Activity Outcome Measure (Version 2)  Help from another person eating meals? 4  Help from another person taking care of personal grooming? 3  Help from another person toileting, which includes using toliet, bedpan, or urinal? 2  Help from another person bathing (including washing, rinsing, drying)? 2  Help from another person to put on and taking off regular upper body  clothing? 4  Help from another person  to put on and taking off regular lower body clothing? 1  6 Click Score 16  OT Goal Progression  Progress towards OT goals Progressing toward goals  Acute Rehab OT Goals  Patient Stated Goal to get home  OT Goal Formulation With patient  Time For Goal Achievement 03/05/18  Potential to Achieve Goals Good  OT Time Calculation  OT Start Time (ACUTE ONLY) 1237  OT Stop Time (ACUTE ONLY) 1250  OT Time Calculation (min) 13 min  OT General Charges  $OT Visit 1 Visit  OT Treatments  $Self Care/Home Management  8-22 mins  Martie RoundJulie Michaelene Dutan, OTR/L Acute Rehabilitation Services Pager: [phone] Office: [phone]

## 2018-02-27 DIAGNOSIS — I10 Essential (primary) hypertension: Secondary | ICD-10-CM

## 2018-02-27 DIAGNOSIS — E119 Type 2 diabetes mellitus without complications: Secondary | ICD-10-CM

## 2018-02-27 DIAGNOSIS — Z794 Long term (current) use of insulin: Secondary | ICD-10-CM

## 2018-02-27 LAB — GLUCOSE, CAPILLARY
Glucose-Capillary: 128 mg/dL — ABNORMAL HIGH (ref 70–99)
Glucose-Capillary: 182 mg/dL — ABNORMAL HIGH (ref 70–99)
Glucose-Capillary: 221 mg/dL — ABNORMAL HIGH (ref 70–99)

## 2018-02-27 LAB — PROTIME-INR
INR: 2.47
Prothrombin Time: 26.4 seconds — ABNORMAL HIGH (ref 11.4–15.2)

## 2018-02-27 MED ORDER — DULOXETINE HCL 40 MG PO CPEP: 40.0000 mg | ORAL_CAPSULE | Freq: Every day | ORAL | Status: DC

## 2018-02-27 MED ORDER — ACETAMINOPHEN 500 MG PO TABS: 1000.0000 mg | ORAL_TABLET | Freq: Three times a day (TID) | ORAL | Status: DC

## 2018-02-27 MED ORDER — POLYETHYLENE GLYCOL 3350 17 G PO PACK: 17.0000 g | PACK | Freq: Every day | ORAL | Status: DC

## 2018-02-27 MED ORDER — BUSPIRONE HCL 10 MG PO TABS: 10.0000 mg | ORAL_TABLET | Freq: Two times a day (BID) | ORAL | Status: DC

## 2018-02-27 MED ORDER — NOVOLOG FLEXPEN 100 UNIT/ML ~~LOC~~ SOPN: 0.0000 [IU] | PEN_INJECTOR | Freq: Three times a day (TID) | SUBCUTANEOUS | Status: DC

## 2018-02-27 MED ORDER — WARFARIN SODIUM 2 MG PO TABS
2.0000 mg | ORAL_TABLET | Freq: Once | ORAL | Status: AC
  Administered 2018-02-27: 2 mg via ORAL
  Filled 2018-02-27: qty 1

## 2018-02-27 MED ORDER — BASAGLAR KWIKPEN 100 UNIT/ML ~~LOC~~ SOPN: 25.0000 [IU] | PEN_INJECTOR | Freq: Every day | SUBCUTANEOUS | Status: DC

## 2018-02-27 MED ORDER — AMLODIPINE BESYLATE 10 MG PO TABS: 10.0000 mg | ORAL_TABLET | Freq: Every day | ORAL | Status: DC

## 2018-02-27 MED ORDER — OXYCODONE HCL 5 MG PO TABS: 2.5000 mg | ORAL_TABLET | Freq: Four times a day (QID) | ORAL | 0 refills | Status: DC | PRN

## 2018-02-27 NOTE — Clinical Social Work Placement (Signed)
   CLINICAL SOCIAL WORK PLACEMENT  NOTE  Date:  02/27/2018  Patient Details  Name: Paul Chen MRN: [id number] Date of Birth: [birth year]  Clinical Social Work is seeking post-discharge placement for this patient at the Skilled  Nursing Facility level of care (*CSW will initial, date and re-position this form in  chart as items are completed):      Patient/family provided with [facility] Clinical Social Work Department's list of facilities offering this level of care within the geographic area requested by the patient (or if unable, by the patient's family).      Patient/family informed of their freedom to choose among providers that offer the needed level of care, that participate in Medicare, Medicaid or managed care program needed by the patient, have an available bed and are willing to accept the patient.      Patient/family informed of [place]'s ownership interest in [facility] and [facility], as well as of the fact that they are under no obligation to receive care at these facilities.  PASRR submitted to EDS on 02/21/18     PASRR number received on 02/26/18     Existing PASRR number confirmed on       FL2 transmitted to all facilities in geographic area requested by pt/family on 02/23/18     FL2 transmitted to all facilities within larger geographic area on       Patient informed that his/her managed care company has contracts with or will negotiate with certain facilities, including the following:        Yes   Patient/family informed of bed offers received.  Patient chooses bed at [facility]     Physician recommends and patient chooses bed at      Patient to be transferred to [facility] on 02/27/18.  Patient to be transferred to facility by [facility]     Patient family notified on 02/27/18 of transfer.  Name of family member notified:  [facility]     PHYSICIAN       Additional Comment:     _______________________________________________ Margarito Liner, LCSW 02/27/2018, 1:38 PM

## 2018-02-27 NOTE — Clinical Social Work Note (Signed)
CSW facilitated patient discharge including contacting patient family and facility to confirm patient discharge plans. Clinical information faxed to facility and family agreeable with plan. CSW arranged ambulance transport via [facility] to [facility] at 3:00 pm. RN to call report prior to discharge [phone] Room 312).  CSW will sign off for now as social work intervention is no longer needed. Please consult [place] again if new needs arise.  Charlynn Court, CSW [phone]

## 2018-02-27 NOTE — Progress Notes (Signed)
Called [facility], [phone] and gave report to Lia HoppingMary Koenig, LPN who will be taking care of patient.

## 2018-02-27 NOTE — Discharge Summary (Addendum)
Physician Discharge Summary  Paul Chen UXN:[id number] DOB: 05-Dec-1940  PCP: Coy Saunas, MD  Admit date: 02/17/2018 Discharge date: 02/27/2018  Recommendations for Outpatient Follow-up:  1. MD at SNF in 2 to 3 days with repeat labs (CBC, BMP, magnesium, PT & INR).  Adjust warfarin dose as needed. 2. Recommend outpatient Urology consultation for evaluation and management of acute urinary retention for which he has Foley catheter and failed voiding trials in the hospital. 3. Dr. Peter Martinique, Cardiology in 3 weeks. 4. Dr. Wylene Simmer, Orthopedics in 2 weeks 5. Dr. Alean Rinne, PCP upon discharge from SNF.   Home Health: Patient being discharged to [facility] city/SNF. Equipment/Devices: TBD at SNF.  Discharge Condition: Improved and stable. CODE STATUS: Full. Diet recommendation: Heart healthy & diabetic diet.  Discharge Diagnoses:  Active Problems:   Respiratory failure [facility])   Brief Summary: 78 year old male with PMH of COPD, DM 2, CAD status post stent in 2010, morbid obesity, CHF, A. fib, toe amputation 02/15/2018 at [facility] who presented to [facility] 02/17/2018 after a fall.  He was noted to be hypoxic.  He was given morphine which led to hypotension, for which he was given IV fluids, which led to increasing respiratory distress and the need for noninvasive mechanical ventilatory support.  He was transferred to [facility] and found to be hypoxic and hypercarbic with respiratory acidosis.  He was intubated for mechanical ventilation on 1/18 and admitted to ICU by CCM.  Extubated 1/19.  CCM transferred over care to [facility] on 02/20/2018.  Hospital course complicated by acute encephalopathy, likely related to opioids- minimizing, left psoas hematoma with associated pain treating conservatively and acute urinary retention requiring reinsertion of Foley catheter.    Assessment & Plan:  Acute on chronic hypoxic and hypercarbic respiratory failure  Likely in the context  of narcotic use for recent foot surgery complicating underlying COPD and OSA.  Extubated 1/19.  Minimized opioid and sedating medications.  Hypoxia resolved for several days now.   Paroxysmal A. fib with RVR Completed Lovenox bridging.  INR therapeutic now.  Target for low end of INR goal due to left psoas hematoma.  Continue Toprol-XL 150 mg daily.  Currently in sinus rhythm.    Outpatient follow-up with Cardiology.  CAD with DES to the proximal RCA 2010 EKG showed new onset ST depression in anterior lateral leads which were not there on January 16.  Cardiology felt this is demand ischemia.  TTE showed no wall motion abnormalities and no further inpatient work-up planned.  Cardiology may consider outpatient noninvasive stress test. As per cardiology, on monotherapy with Plavix due to CAD/PTCA and on Coumadin for A. fib, DVT and PE.  Outpatient follow-up with cardiology.  History of PE/DVT Completed Lovenox bridging.  INR therapeutic on Coumadin, target to low end of INR goal due to left psoas hematoma.  INR therapeutic at 2.4.  Continue prior home dose of Coumadin at 2.5 mg every afternoon.  Follow INR closely at SNF.  Hypokalemia:  Replaced.  Magnesium 1.7.  Continue prior home magnesium supplements.  Essential hypertension Mildly uncontrolled at times.  Continue amlodipine 10 mg daily, doxazosin 4 mg every afternoon, Toprol-XL 150 mg daily.    Monitor closely at SNF and may consider resuming lisinopril which was discontinued due to acute kidney injury.  Acute kidney injury on stage III chronic kidney disease Baseline creatinine reportedly 1.2.  Creatinine 3.48 at presentation.  Creatinine down to 1.3.  Avoid nephrotoxic's.  Follow BMPs closely as  outpatient.   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  CBGs fluctuating.  Had hypoglycemic range/60 mg/dL on morning of 1/24.  Suspect due to variable oral intake due to mental status changes.  Reduced Lantus from 25 to 20 units nightly.   SSI.  Now that patient is alert, eating better, increased Lantus back up to 25 units nightly and monitor closely.  CBGs overall better, mildly uncontrolled with mild fluctuations at times.  Monitor closely at SNF and adjust insulins as needed.  S/P right second toe amputation Orthopedic sign off 1/24 appreciated.  WBAT RLE in flat postop shoe, daily dry dressing changes living Mepitel layer in place, postop outpatient follow-up with Dr. Doran Durand.  Acute toxic metabolic encephalopathy Noted to be somnolent on 1/23, responded to Narcan.  Suspected due to opioids.  Opioids were significantly minimized.  BuSpar and Cymbalta dose were also reduced.  Remains on prior home dose of Neurontin.  Acute encephalopathy has resolved for couple days now.  Minimize medications that might precipitate.   Fall/left hip pain/left psoas hematoma Reportedly with history of chronic hip pain.  X-ray on admission did not show acute injury.  Seen by orthopedics and MRI of hip was ordered but unable to complete because he was not able to lie still.  CT pelvis 1/23 showed possible left psoas muscle hemorrhage/hematoma measuring approximately 4 x 4 x 5 cm.  No acute fractures.  The hematoma may be the source of his pain.  Supportive treatment.  Increased scheduled Tylenol to 1 g 3 times daily.  If pain worsens or progressive anemia then may have to hold anticoagulation.  Pain much better controlled.  Acute urinary retention After removing Foley catheter couple days ago, developed recurrent acute urinary retention and Foley catheter was placed back on 1/24.  DC with Foley catheter to SNF and outpatient follow-up with urology in 2 weeks.  OSA Has not been on CPAP here.  As per discussion with daughter, patient has been noncompliant with CPAP for years.  Constipation Bowel regimen.  Having BMs.  Abnormal LFTs Unclear etiology.  Transaminitis has resolved.  Normocytic anemia Stable.  Chronic pain Pain management as  discussed above.  Chronic diastolic CHF: TTE 2/99/2426: LVEF 60-65% and grade 1 diastolic dysfunction.  Has been clinically euvolemic for several days despite not being on diuretics.  Monitor closely at SNF and consider need for reinitiation of Lasix.   Consultants:  PCCM-signed off Cardiology-signed off Orthopedics-signed off.  Procedures:  Intubated/extubated. Foley catheter discontinued 1/23 In and out catheterization  Discharge Instructions  Discharge Instructions    (HEART FAILURE PATIENTS) Call MD:  Anytime you have any of the following symptoms: 1) 3 pound weight gain in 24 hours or 5 pounds in 1 week 2) shortness of breath, with or without a dry hacking cough 3) swelling in the hands, feet or stomach 4) if you have to sleep on extra pillows at night in order to breathe.   Complete by:  As directed    Call MD for:   Complete by:  As directed    Recurrent or worsening confusion or mental status changes.   Call MD for:  difficulty breathing, headache or visual disturbances   Complete by:  As directed    Call MD for:  extreme fatigue   Complete by:  As directed    Call MD for:  persistant dizziness or light-headedness   Complete by:  As directed    Call MD for:  persistant nausea and  vomiting   Complete by:  As directed    Call MD for:  redness, tenderness, or signs of infection (pain, swelling, redness, odor or green/yellow discharge around incision site)   Complete by:  As directed    Call MD for:  severe uncontrolled pain   Complete by:  As directed    Call MD for:  temperature >100.4   Complete by:  As directed    Diet - low sodium heart healthy   Complete by:  As directed    Diet Carb Modified   Complete by:  As directed    Increase activity slowly   Complete by:  As directed        Medication List    STOP taking these medications   cephALEXin 500 MG capsule Commonly known as:  KEFLEX   ciclopirox 8 % solution Commonly known as:  PENLAC   enoxaparin  120 MG/0.8ML injection Commonly known as:  LOVENOX   furosemide 20 MG tablet Commonly known as:  LASIX   lisinopril 10 MG tablet Commonly known as:  PRINIVIL,ZESTRIL   silver sulfADIAZINE 1 % cream Commonly known as:  SILVADENE   XTAMPZA ER 36 MG C12a Generic drug:  oxyCODONE ER     TAKE these medications   acetaminophen 500 MG tablet Commonly known as:  TYLENOL Take 2 tablets (1,000 mg total) by mouth 3 (three) times daily.   albuterol (2.5 MG/3ML) 0.083% nebulizer solution Commonly known as:  PROVENTIL Take 2.5 mg by nebulization every 4 (four) hours as needed for wheezing or shortness of breath.   allopurinol 300 MG tablet Commonly known as:  ZYLOPRIM Take 300 mg by mouth daily.   amLODipine 10 MG tablet Commonly known as:  NORVASC Take 1 tablet (10 mg total) by mouth daily. What changed:    medication strength  how much to take   atorvastatin 40 MG tablet Commonly known as:  LIPITOR Take 1 tablet (40 mg total) by mouth daily.   BASAGLAR KWIKPEN 100 UNIT/ML Sopn Inject 0.25 mLs (25 Units total) into the skin at bedtime. What changed:    how much to take  when to take this   busPIRone 10 MG tablet Commonly known as:  BUSPAR Take 1 tablet (10 mg total) by mouth 2 (two) times daily. What changed:    medication strength  how much to take   CENTRUM SILVER ULTRA MENS PO Take 1 tablet by mouth daily.   clopidogrel 75 MG tablet Commonly known as:  PLAVIX TAKE ONE TABLET BY MOUTH EVERY MORNING   doxazosin 4 MG tablet Commonly known as:  CARDURA Take 4 mg by mouth every evening.   DULoxetine HCl 40 MG Cpep Take 40 mg by mouth daily. What changed:    medication strength  how much to take   Fish Oil 1000 MG Caps Take 1,000 mg by mouth at bedtime.   FLOVENT HFA 44 MCG/ACT inhaler Generic drug:  fluticasone Inhale 3 puffs into the lungs 2 (two) times daily.   gabapentin 300 MG capsule Commonly known as:  NEURONTIN Take 600 mg by mouth 2  (two) times daily.   glucagon 1 MG injection Inject 1 mg into the vein once as needed (for low blood sugars.).   ipratropium 0.02 % nebulizer solution Commonly known as:  ATROVENT Take 0.5 mg by nebulization every 4 (four) hours as needed for wheezing or shortness of breath.   liraglutide 18 MG/3ML Sopn Commonly known as:  VICTOZA Inject 0.3 mg into the skin  daily.   Magnesium Oxide 400 (240 Mg) MG Tabs Take 400 mg by mouth 2 (two) times daily.   metoprolol succinate 100 MG 24 hr tablet Commonly known as:  TOPROL-XL Take 150 mg by mouth daily.   NARCAN 4 MG/0.1ML Liqd nasal spray kit Generic drug:  naloxone Place 1 spray into the nose as needed (opioid overdose).   nitroGLYCERIN 0.4 MG SL tablet Commonly known as:  NITROSTAT Place 0.4 mg under the tongue every 5 (five) minutes as needed for chest pain.   NOVOLOG FLEXPEN 100 UNIT/ML FlexPen Generic drug:  insulin aspart Inject 0-9 Units into the skin 3 (three) times daily with meals. CBG < 70: implement hypoglycemia protocol CBG 70 - 120: 0 units CBG 121 - 150: 1 unit CBG 151 - 200: 2 units CBG 201 - 250: 3 units CBG 251 - 300: 5 units CBG 301 - 350: 7 units CBG 351 - 400: 9 units CBG > 400: call MD.. What changed:    how much to take  when to take this  additional instructions   oxyCODONE 5 MG immediate release tablet Commonly known as:  Oxy IR/ROXICODONE Take 0.5 tablets (2.5 mg total) by mouth every 6 (six) hours as needed for moderate pain or severe pain. What changed:    medication strength  how much to take  when to take this  reasons to take this   polyethylene glycol packet Commonly known as:  MIRALAX / GLYCOLAX Take 17 g by mouth daily. Start taking on:  February 28, 2018   warfarin 2.5 MG tablet Commonly known as:  COUMADIN Take 2.5 mg by mouth every evening.       Contact information for follow-up providers    MD at SNF. Schedule an appointment as soon as possible for a visit.   Why:   To be seen in 2 to 3 days with repeat labs (CBC, BMP, Mg, PT & INR).  Adjust warfarin dose as needed. Recommend outpatient Urology consultation in 1 week to evaluate for urinary retention for which he has Foley catheter.       Martinique, Peter M, MD. Schedule an appointment as soon as possible for a visit in 3 week(s).   Specialty:  Cardiology Why:  Hospital follow-up. Contact information: [address] [place] [address] [phone]        Wylene Simmer, MD. Schedule an appointment as soon as possible for a visit in 2 week(s).   Specialty:  Orthopedic Surgery Why:  Hospital follow-up. Contact information: [address] [place] [address] [phone]        [facility]. Schedule an appointment as soon as possible for a visit.   Why:  Evaluation of Urinary retention. Contact information: [place]       Coy Saunas, MD. Schedule an appointment as soon as possible for a visit.   Specialty:  Family Medicine Why:  Upon discharge from SNF. Contact information: [place] [address] [phone]            Contact information for after-discharge care    Destination    [facility] .   Service:  Skilled Nursing Contact information: [place] [phone]                 No Known Allergies    Procedures/Studies: Ct Pelvis Wo  Contrast  Result Date: 02/22/2018 CLINICAL DATA:  78 year old male with persistent LEFT hip pain following fall. Patient on anticoagulation. Recent PTCA. EXAM: CT PELVIS WITHOUT CONTRAST TECHNIQUE: Multidetector CT imaging of the pelvis was performed following the standard protocol without intravenous contrast. COMPARISON:  03/12/2009 CT FINDINGS: Urinary Tract: Gas in the bladder is noted probably related to recent catheterization but correlate  with any signs of infection. Bowel:  No abnormalities identified of pelvic bowel loops. Vascular/Lymphatic: Aortic, iliac and femoral atherosclerotic calcification noted without evidence of aneurysm. Reproductive:  Unremarkable Other:  No ascites or pneumoperitoneum. Musculoskeletal: Ill-defined hyperdense prominence of the LEFT psoas muscle is noted, likely representing hemorrhage/hematoma. This area measures approximately 4 x 4 x 5 cm, but difficult to accurately measure due to adjacent lumbar spine surgical hardware artifact. No acute fracture is identified. Posterior/interbody fusion and decompression changes at L5-S1 noted. SI joint bony fusion noted. Mild degenerative changes within both hips identified. IMPRESSION: 1. Ill-defined hyperdense prominence of the LEFT psoas muscle likely representing hemorrhage/hematoma and measuring approximately 4 x 4 x 5 cm. 2. Bladder gas which may be related to recent catheterization but correlate with infection. 3. No evidence of acute fracture.  Lumbar spine surgical changes. 4.  Aortic Atherosclerosis (ICD10-I70.0). These results will be called to the ordering clinician or representative by the Radiologist Assistant, and communication documented in the PACS or zVision Dashboard. Electronically Signed   By: Margarette Canada M.D.   On: 02/22/2018 20:31   Dg Chest Port 1 View  Result Date: 02/20/2018 CLINICAL DATA:  Hypoxia EXAM: PORTABLE CHEST 1 VIEW COMPARISON:  Three days ago FINDINGS: Interval extubation. Low volumes with indistinct opacities at the bases, likely atelectasis. Cardiomegaly accentuated by low volumes. No effusion or pneumothorax. IMPRESSION: Lower volumes after extubation with increased atelectasis. Electronically Signed   By: Monte Fantasia M.D.   On: 02/20/2018 06:31   Dg Chest Port 1 View  Result Date: 02/17/2018 CLINICAL DATA:  Evaluate endotracheal tube placement. EXAM: PORTABLE CHEST 1 VIEW COMPARISON:  Earlier today at 0352 hours FINDINGS: 1833  hours. Endotracheal tube terminates 6.3 cm cm above carina. Numerous leads and wires project over the chest. Borderline cardiomegaly. Possible trace left pleural fluid. No pneumothorax. Low lung volumes with new or increased bibasilar atelectasis. IMPRESSION: 1. Appropriate position of endotracheal tube. 2. Low lung volumes with developing bibasilar atelectasis. Possible trace left pleural fluid or thickening. Electronically Signed   By: Abigail Miyamoto M.D.   On: 02/17/2018 19:12   Dg Hip Unilat With Pelvis 2-3 Views Left  Result Date: 02/19/2018 CLINICAL DATA:  Left hip pain laterally.  Fall in the last week. EXAM: DG HIP (WITH OR WITHOUT PELVIS) 2-3V LEFT COMPARISON:  None. FINDINGS: There mild symmetric degenerative changes of the hips. No acute fracture or dislocation. Degenerative change of the spine with fusion hardware present at the L5-S1 level as well as partially visualized over the mid lumbar spine. Stent over the right femoral artery. IMPRESSION: No acute findings. 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 Electronically Signed   By: Marin Olp M.D.   On: 02/19/2018 16:53      Subjective: Patient interviewed and examined with RN.  Denies complaints.  No pain reported.  No dyspnea, chest pain, dizziness or lightheadedness.  Tolerating diet.  Having BMs.  Discharge Exam:  Vitals:   02/26/18 0506 02/26/18 2013 02/27/18 0910 02/27/18 1128  BP: (!) 167/64 (!) 182/64 (!) 177/68 (!) 141/66  Pulse: 60 65  74  Resp:  20  19  Temp: 97.6 F (36.4 C) 98.2 F (36.8 C) 98 F (36.7 C) 97.9 F (36.6 C)  TempSrc: Oral Oral Oral Oral  SpO2: 96% 99%  100%  Weight: 109.4 kg     Height:        General exam: Elderly male, moderately built and obese, sitting up comfortably at edge of bed without distress. Respiratory system: Clear to auscultation.  No increased work of breathing.  Cardiovascular system: S1 & S2 heard, RRR. No JVD, murmurs, rubs, gallops or clicks. No pedal edema.    Gastrointestinal system: Abdomen is nondistended, soft and nontender. No organomegaly or masses felt. Normal bowel sounds heard.  Central nervous system: Alert and oriented.  No focal neurological deficits. Extremities: Symmetric 5 x 5 power.  Healed left great toe amputation.  Right second toe amputation postop site clean without acute findings. Skin: Patchy area of bruise approximately tennis ball size over left flank, does not appear new and does not seem to be increasing in size Psychiatry: Judgement and insight intact. Mood & affect pleasant and appropriate.     The results of significant diagnostics from this hospitalization (including imaging, microbiology, ancillary and laboratory) are listed below for reference.     Microbiology: Recent Results (from the past 240 hour(s))  MRSA PCR Screening     Status: None   Collection Time: 02/17/18  1:03 PM  Result Value Ref Range Status   MRSA by PCR NEGATIVE NEGATIVE Final    Comment:        The GeneXpert MRSA Assay (FDA approved for NASAL specimens only), is one component of a comprehensive MRSA colonization surveillance program. It is not intended to diagnose MRSA infection nor to guide or monitor treatment for MRSA infections. Performed at [facility], [place] [address], [place], [place] [address]   Urine culture     Status: None   Collection Time: 02/17/18  1:15 PM  Result Value Ref Range Status   Specimen Description URINE, RANDOM  Final   Special Requests NONE  Final   Culture   Final    NO GROWTH Performed at [facility], [place] [address], [place], [place] [address]    Report Status 02/18/2018 FINAL  Final  Culture, blood (routine x 2)     Status: None   Collection Time: 02/17/18  2:20 PM  Result Value Ref Range Status   Specimen Description BLOOD RIGHT ANTECUBITAL  Final   Special Requests   Final    BOTTLES DRAWN AEROBIC ONLY Blood Culture adequate volume   Culture   Final    NO GROWTH 5 DAYS Performed  at [facility], [place] [address] [place], [place] [address]    Report Status 02/22/2018 FINAL  Final  Culture, blood (routine x 2)     Status: None   Collection Time: 02/17/18  2:26 PM  Result Value Ref Range Status   Specimen Description BLOOD RIGHT HAND  Final   Special Requests   Final    BOTTLES DRAWN AEROBIC ONLY Blood Culture adequate volume   Culture   Final    NO GROWTH 5 DAYS Performed at [facility], [place] [address], [place], [place] [address]    Report Status 02/22/2018 FINAL  Final  Culture, Urine     Status: None   Collection Time: 02/24/18 10:08 PM  Result Value Ref Range Status   Specimen Description URINE, CATHETERIZED  Final   Special Requests NONE  Final  Culture   Final    NO GROWTH Performed at [facility], [place] [address], [place], [place] [address]    Report Status 02/26/2018 FINAL  Final     Labs: CBC: Recent Labs  Lab 02/23/18 0724 02/24/18 0221 02/25/18 0222 02/26/18 0217  WBC 10.9* 9.1 10.5 11.3*  HGB 10.9* 10.7* 11.0* 11.4*  HCT 33.2* 33.6* 33.9* 35.6*  MCV 91.2 89.8 89.7 89.2  PLT 231 234 264 062   Basic Metabolic Panel: Recent Labs  Lab 02/21/18 0604 02/22/18 0211 02/24/18 0221  NA 140 138 136  K 3.5 3.7 3.6  CL 100 100 100  CO2 _0 GLUCOSE 241* 259* 263*  BUN _1 CREATININE 1.45* 1.46* 1.35*  CALCIUM 8.5* 8.3* 8.5*  MG 1.7  --   --    Liver Function Tests: Recent Labs  Lab 02/24/18 0221  AST 28  ALT 34  ALKPHOS 50  BILITOT 1.4*  PROT 6.3*  ALBUMIN 3.0*   CBG: Recent Labs  Lab 02/26/18 1107 02/26/18 1543 02/26/18 2148 02/27/18 0848 02/27/18 1127  GLUCAP 202* 256* 159* 128* 182*    I discussed in detail with patient's daughter, updated care and answered questions.  She reports that she was in to see him yesterday and his mental status was back to baseline/normal.  Time coordinating discharge: 50 minutes  SIGNED:  Vernell Leep, MD, FACP, [facility]  To  contact the attending provider between 7A-7P or the covering provider during after hours 7P-7A, please log into the web site [email or web address] and access using universal [place] password for that web site. If you do not have the password, please call the hospital operator.

## 2018-02-27 NOTE — Progress Notes (Signed)
Patient refused HS  CPAP this shift.

## 2018-02-27 NOTE — Progress Notes (Signed)
ANTICOAGULATION CONSULT NOTE  Pharmacy Consult for warfarin Indication: atrial fibrillation and hx of DVT and PE   No Known Allergies  Vital Signs: Temp: 98 F (36.7 C) (01/28 0910) Temp Source: Oral (01/28 0910) BP: 177/68 (01/28 0910)  Labs: Recent Labs    02/25/18 0222 02/26/18 0217 02/27/18 0239  HGB 11.0* 11.4*  --   HCT 33.9* 35.6*  --   PLT 264 293  --   LABPROT 22.3* 23.2* 26.4*  INR 1.99 2.10 2.47   Estimated Creatinine Clearance: 58.6 mL/min (A) (by C-G formula based on SCr of 1.35 mg/dL (H)).  Assessment: 8 yoM presenting to [facility] from OSH after a fall with h/o Afib and PE and DVT on warfarin 2.5 mg daily PTA, recently held for 2nd toe amputation performed 1/16 (INR 1.16), discharged with BID enoxaparin bridge to therapeutic INR> readmitted for SOB.  -INR at goal (with trend up) -warfarin dose missed 1/24  Goal of Therapy:  INR 2-3 Monitor platelets by anticoagulation protocol: Yes   Plan:  Warfarin 2 mg x1 Will target lower end of INR goal given psoas hematoma  Daily INR, CBC  Harland German, PharmD Clinical Pharmacist **Pharmacist phone directory can now be found on [email or web address] ([email or web address]).  Listed under [facility].

## 2018-02-27 NOTE — Discharge Instructions (Signed)

## 2018-02-27 NOTE — Progress Notes (Signed)
Patient is ready for discharge. He is alert and oriented. He has all of his belongings with him. IV has been removed without complication, catheter intact. He will leave with Foley Catheter intact per physician. Patient will be transported to [facility] by [place]. Discharge paperwork has been given to [facility]. Report has been given to Lia Hopping, LPN at [facility].

## 2018-02-27 NOTE — Clinical Social Work Note (Addendum)
Insurance authorization still pending.  Charlynn Court, CSW [phone]  12:23 pm Insurance authorization approved. MD and daughter aware.  Charlynn Court, CSW [phone]

## 2018-03-13 ENCOUNTER — Ambulatory Visit: Payer: Medicare Other | Admitting: Adult Health

## 2018-03-13 NOTE — Progress Notes (Deleted)
Cardiology Office Note   Date:  03/13/2018   ID:  Paul Chen, Paul Chen 1940-04-16, MRN [id number]  PCP:  Carmin Richmond, MD  Cardiologist:  Dr.Jordan  No chief complaint on file.    History of Present Illness: Paul Chen is a 78 y.o. male who presents for ongoing assessment and management of CAD with hx of  DES x3 to proximal to mid RCA in 2010 with repeat cardiac catheterization in 2011 with patent stents. She has other history of  COPD, IDDM, PAD with left SFA with PTA ,which resulted in dissection and a subsequent stenting in April 2019.  history of DVT and PE in 2010, moderate to severe pulmonary hypertension, obstructive sleep apnea, CKD stage IIIand morbid obesity.He is on chronic Plavix and Coumadin therapy for hx of PE and CAD.   She was seen for consultation in the setting of acute respiratory failure, after family reported him to be confused, pale and not responding to questions. He had apparently choked on some food the day prior.Marland Kitchen He was seen originally at [facility] ED and transferred to [facility]. He was noted to be in atrial fib with RVR in [facility] ED. Troponin was elevated with diffuse T-wave inversion. He required intubation.   After a 10 day hospitalization it was found that his respiratory failure was related to opioid use. He was taking pain medications after recent foot surgery and underlying COPD. He was continued on coumadin, Plavix , and metoprolol. He was placed in SNF.   Past Medical History:  Diagnosis Date  . Arthritis   . Chronic renal insufficiency   . CKD (chronic kidney disease), stage III (HCC)   . COPD (chronic obstructive pulmonary disease) (HCC)    with restriction  . Coronary disease October 2010   Status post stenting of the proximal to mid right coronary with DES.  Status post extensive stenting of the proximal right coronary to the crux.  He has moderate disease in  the left coronary system  . Depression   . Diabetes mellitus    Insulin  Dependent  . Diastolic dysfunction    Congestive heart failure with  . Dyspnea    multifactorial. chronic, 11/18/9- not an issue at this time  . Gout   . History of blood transfusion   . History of diabetic ulcer of foot    right  . Hypertension   . Hypoventilation    syndrome  . Obesity   . Pneumonia   . Pulmonary embolism [facility]) March 2010   hx of and DVT in March of 2010 on chronic Coumadin therapy  . Pulmonary hypertension (HCC)    Moderate to severe  . Retroperitoneal bleed    Hx of right pelvic  . Sleep apnea    Obstructive , no CPAP  . Wears dentures     Past Surgical History:  Procedure Laterality Date  . ABDOMINAL AORTOGRAM W/LOWER EXTREMITY Right 01/15/2018   Procedure: ABDOMINAL AORTOGRAM W/LOWER EXTREMITY;  Surgeon: Maeola Harman, MD;  Location: [facility] INVASIVE CV LAB;  Service: Cardiovascular;  Laterality: Right;  . AMPUTATION TOE Left 05/04/2017   Procedure: Left hallux amputation;  Surgeon: Toni Arthurs, MD;  Location: [facility];  Service: Orthopedics;  Laterality: Left;  . AMPUTATION TOE Right 12/21/2017   Procedure: Right hallux amputation;  Surgeon: Toni Arthurs, MD;  Location: [facility];  Service: Orthopedics;  Laterality: Right;   . AMPUTATION TOE Right 02/15/2018   Procedure: Right 2nd toe  amputation;  Surgeon: Toni Arthurs, MD;  Location: [facility] OR;  Service: Orthopedics;  Laterality: Right;  62  . BACK SURGERY  2000   post  . CARDIOVASCULAR STRESS TEST  11-14-2008   EF 44%  . EXPLORATORY LAPAROTOMY  1972   POST  . LOWER EXTREMITY ANGIOGRAPHY N/A 05/01/2017   Procedure: LOWER EXTREMITY ANGIOGRAPHY;  Surgeon: Maeola Harman, MD;  Location: [facility] INVASIVE CV LAB;  Service: Cardiovascular;  Laterality: N/A;  . LOWER EXTREMITY ANGIOGRAPHY N/A 05/15/2017   Procedure: LOWER EXTREMITY ANGIOGRAPHY;  Surgeon: Maeola Harman, MD;  Location: [facility] INVASIVE CV LAB;  Service: Cardiovascular;  Laterality: N/A;  .  MULTIPLE TOOTH EXTRACTIONS    . PERIPHERAL VASCULAR ATHERECTOMY  05/01/2017   Procedure: PERIPHERAL VASCULAR ATHERECTOMY;  Surgeon: Maeola Harman, MD;  Location: [facility] INVASIVE CV LAB;  Service: Cardiovascular;;  Left SFA  . PERIPHERAL VASCULAR BALLOON ANGIOPLASTY  05/01/2017   Procedure: PERIPHERAL VASCULAR BALLOON ANGIOPLASTY;  Surgeon: Maeola Harman, MD;  Location: [facility] INVASIVE CV LAB;  Service: Cardiovascular;;  Left Anterial Tibial Artery  . PERIPHERAL VASCULAR BALLOON ANGIOPLASTY Right 01/15/2018   Procedure: PERIPHERAL VASCULAR BALLOON ANGIOPLASTY;  Surgeon: Maeola Harman, MD;  Location: [facility] INVASIVE CV LAB;  Service: Cardiovascular;  Laterality: Right;  SFA atherectomy  . PERIPHERAL VASCULAR INTERVENTION Right 05/15/2017   Procedure: PERIPHERAL VASCULAR INTERVENTION;  Surgeon: Maeola Harman, MD;  Location: [facility] INVASIVE CV LAB;  Service: Cardiovascular;  Laterality: Right;  SFA Stent  . US ECHOCARDIOGRAPHY  11-25-2008   EF 55-60%     Current Outpatient Medications  Medication Sig Dispense Refill  . acetaminophen (TYLENOL) 500 MG tablet Take 2 tablets (1,000 mg total) by mouth 3 (three) times daily.    Marland Kitchen albuterol (PROVENTIL) (2.5 MG/3ML) 0.083% nebulizer solution Take 2.5 mg by nebulization every 4 (four) hours as needed for wheezing or shortness of breath.     . allopurinol (ZYLOPRIM) 300 MG tablet Take 300 mg by mouth daily.   11  . amLODipine (NORVASC) 10 MG tablet Take 1 tablet (10 mg total) by mouth daily.    Marland Kitchen atorvastatin (LIPITOR) 40 MG tablet Take 1 tablet (40 mg total) by mouth daily. 90 tablet 3  . busPIRone (BUSPAR) 10 MG tablet Take 1 tablet (10 mg total) by mouth 2 (two) times daily.    . clopidogrel (PLAVIX) 75 MG tablet TAKE ONE TABLET BY MOUTH EVERY MORNING 30 tablet 11  . doxazosin (CARDURA) 4 MG tablet Take 4 mg by mouth every evening.     . DULoxetine 40 MG CPEP Take 40 mg by mouth daily.    Marland Kitchen FLOVENT HFA 44 MCG/ACT inhaler  Inhale 3 puffs into the lungs 2 (two) times daily.     Marland Kitchen gabapentin (NEURONTIN) 300 MG capsule Take 600 mg by mouth 2 (two) times daily.    Marland Kitchen glucagon 1 MG injection Inject 1 mg into the vein once as needed (for low blood sugars.).     . Insulin Glargine (BASAGLAR KWIKPEN) 100 UNIT/ML SOPN Inject 0.25 mLs (25 Units total) into the skin at bedtime.    Marland Kitchen ipratropium (ATROVENT) 0.02 % nebulizer solution Take 0.5 mg by nebulization every 4 (four) hours as needed for wheezing or shortness of breath.     . liraglutide (VICTOZA) 18 MG/3ML SOPN Inject 0.3 mg into the skin daily.    . Magnesium Oxide 400 (240 Mg) MG TABS Take 400 mg by mouth 2 (two) times daily.  5  . metoprolol succinate (TOPROL-XL) 100  MG 24 hr tablet Take 150 mg by mouth daily.     . Multiple Vitamins-Minerals (CENTRUM SILVER ULTRA MENS PO) Take 1 tablet by mouth daily.  99  . naloxone (NARCAN) nasal spray 4 mg/0.1 mL Place 1 spray into the nose as needed (opioid overdose).     . nitroGLYCERIN (NITROSTAT) 0.4 MG SL tablet Place 0.4 mg under the tongue every 5 (five) minutes as needed for chest pain.     Marland Kitchen. NOVOLOG FLEXPEN 100 UNIT/ML FlexPen Inject 0-9 Units into the skin 3 (three) times daily with meals. CBG < 70: implement hypoglycemia protocol CBG 70 - 120: 0 units CBG 121 - 150: 1 unit CBG 151 - 200: 2 units CBG 201 - 250: 3 units CBG 251 - 300: 5 units CBG 301 - 350: 7 units CBG 351 - 400: 9 units CBG > 400: call MD..    . Omega-3 Fatty Acids (FISH OIL) 1000 MG CAPS Take 1,000 mg by mouth at bedtime.    Marland Kitchen. oxyCODONE (OXY IR/ROXICODONE) 5 MG immediate release tablet Take 0.5 tablets (2.5 mg total) by mouth every 6 (six) hours as needed for moderate pain or severe pain. 6 tablet 0  . polyethylene glycol (MIRALAX / GLYCOLAX) packet Take 17 g by mouth daily.    Marland Kitchen. warfarin (COUMADIN) 2.5 MG tablet Take 2.5 mg by mouth every evening.   5   No current facility-administered medications for this visit.     Allergies:   Patient has no  known allergies.    Social History:  The patient  reports that he quit smoking about 38 years ago. He quit after 2.00 years of use. He has never used smokeless tobacco. He reports previous alcohol use. He reports that he does not use drugs.   Family History:  The patient's family history includes Coronary artery disease in his father and mother; Heart attack in his father and mother; Hypertension in his mother.    ROS: All other systems are reviewed and negative. Unless otherwise mentioned in H&P    PHYSICAL EXAM: VS:  There were no vitals taken for this visit. , BMI There is no height or weight on file to calculate BMI. GEN: Well nourished, well developed, in no acute distress HEENT: normal Neck: no JVD, carotid bruits, or masses Cardiac: ***RRR; no murmurs, rubs, or gallops,no edema  Respiratory:  Clear to auscultation bilaterally, normal work of breathing GI: soft, nontender, nondistended, + BS MS: no deformity or atrophy Skin: warm and dry, no rash Neuro:  Strength and sensation are intact Psych: euthymic mood, full affect   EKG:  EKG {ACTION; IS/IS [id number]} ordered today. The ekg ordered today demonstrates ***   Recent Labs: 02/21/2018: Magnesium 1.7 02/24/2018: ALT 34; BUN 16; Creatinine, Ser 1.35; Potassium 3.6; Sodium 136 02/26/2018: Hemoglobin 11.4; Platelets 293    Lipid Panel    Component Value Date/Time   CHOL 118 08/09/2017 1402   TRIG 150 (H) 08/09/2017 1402   HDL 25 (L) 08/09/2017 1402   CHOLHDL 4.7 08/09/2017 1402   CHOLHDL 8.5 11/27/2008 1038   VLDL 36 11/27/2008 1038   LDLCALC 63 08/09/2017 1402      Wt Readings from Last 3 Encounters:  02/26/18 241 lb 2.9 oz (109.4 kg)  02/15/18 250 lb (113.4 kg)  01/15/18 245 lb (111.1 kg)      Other studies Reviewed: Additional studies/ records that were reviewed today include: ***. Review of the above records demonstrates: ***   ASSESSMENT AND PLAN:  1.  ***  Current medicines are reviewed at  length with the patient today.    Labs/ tests ordered today include: *** Bettey Mare. Liborio Nixon, ANP, [facility]   03/13/2018 7:26 AM    [facility] [address] Office [phone] Fax [phone]

## 2018-03-14 ENCOUNTER — Encounter: Payer: Self-pay | Admitting: *Deleted

## 2018-03-19 ENCOUNTER — Ambulatory Visit: Payer: Medicare HMO | Admitting: Cardiology

## 2018-04-06 ENCOUNTER — Other Ambulatory Visit: Payer: Self-pay | Admitting: Cardiology

## 2018-07-23 ENCOUNTER — Other Ambulatory Visit: Payer: Self-pay | Admitting: Cardiology

## 2018-07-23 DIAGNOSIS — I10 Essential (primary) hypertension: Secondary | ICD-10-CM

## 2018-10-15 ENCOUNTER — Other Ambulatory Visit: Payer: Self-pay | Admitting: Cardiology

## 2018-10-30 NOTE — Progress Notes (Signed)
Cardiology Office Note    Date:  10/31/2018   ID:  Jakub, Debold 04-25-1940, MRN [id number]  PCP:  Carmin Richmond, MD  Cardiologist:  Dr. Swaziland  Chief Complaint  Patient presents with  . Coronary Artery Disease  . Atrial Fibrillation    History of Present Illness:  Paul Chen is a 78 y.o. male with PMH of COPD, CKD stage III, IDDM, HTN, PAD, obesity hypoventilation syndrome, history of DVT and PE in 2010, moderate to severe pulmonary hypertension, obstructive sleep apnea and CAD.  He underwent sequential DES x3 to proximal to mid RCA in 2010.  Repeat cardiac catheterization in 2011 showed patent stents.  Myoview in August 2012 showed inferior infarct without ischemia, EF 51%.  He is on chronic Plavix and the Coumadin therapy for his coronary artery disease and also PE.  Carotid Doppler in April 2018 showed moderate RICA disease and a mild left ICA disease.  He underwent a stress test on 07/28/2016 which showed EF 52%, small inferoapical defect that improves during recovery, does not appears to represent significant ischemia and may represent soft tissue attenuation, overall considered low risk scan.  He was being followed by [facility] for peripheral arterial disease.  Last lower extremity Doppler in August 2018 showed arterial obstruction involving the right SFA, popliteal artery and of tibial peroneal vessel, arterial obstruction also involving the left aortoiliac segment.   He was seen in March 2019 for pre operative evaluation for left toe amputation for osteomyelitis. Recommendations made concerning bridging anticoagulation. He underwent left hallux amputation on 05/04/17. On 05/15/17 he underwent revascularization of the left SFA with PTA resulting in dissection and subsequent stenting.   Since his last visit he underwent right second toe amputation. In January 2020 he was admitted after a fall with acute respiratory failure and encephalopathy. Required mechanical ventilation. Felt to be  due to opioid use for his foot in setting of OSA and COPD. He had a left psoas hematoma related to his fall. He had acute renal failure with creatinine up to 3.48. later returned to 1.3. he had acute urinary retention. He had demand ischemia with peak troponin 1.47. Echo showed normal LV function. He had Afib managed with rate control and anticoagulation- targeting low therapeutic INR due to hematoma. He was in the hospital for 10 days. Spent 3 weeks in NH in [place] city.   Shortly after his hospital stay he had 2 syncopal episodes. One while standing next to his truck and the other after getting out of bed. Both times he blanked out very briefly for a couple of seconds. These occurred in April and he has no further syncope since. He denies any chest pain or palpitations. His breathing is doing OK. He has a lot of pain in back, hips, legs and neck. Reports neuropathy is worse. Is being seen in pain clinic and is on a patch now. He has lost 12 lbs from one year ago.    Past Medical History:  Diagnosis Date  . Arthritis   . Chronic renal insufficiency   . CKD (chronic kidney disease), stage III (HCC)   . COPD (chronic obstructive pulmonary disease) (HCC)    with restriction  . Coronary disease October 2010   Status post stenting of the proximal to mid right coronary with DES.  Status post extensive stenting of the proximal right coronary to the crux.  He has moderate disease in  the left coronary system  . Depression   .  Diabetes mellitus    Insulin Dependent  . Diastolic dysfunction    Congestive heart failure with  . Dyspnea    multifactorial. chronic, 11/18/9- not an issue at this time  . Gout   . History of blood transfusion   . History of diabetic ulcer of foot    right  . Hypertension   . Hypoventilation    syndrome  . Obesity   . Pneumonia   . Pulmonary embolism [facility]) March 2010   hx of and DVT in March of 2010 on chronic Coumadin therapy  . Pulmonary hypertension (HCC)     Moderate to severe  . Retroperitoneal bleed    Hx of right pelvic  . Sleep apnea    Obstructive , no CPAP  . Wears dentures     Past Surgical History:  Procedure Laterality Date  . ABDOMINAL AORTOGRAM W/LOWER EXTREMITY Right 01/15/2018   Procedure: ABDOMINAL AORTOGRAM W/LOWER EXTREMITY;  Surgeon: Waynetta Sandy, MD;  Location: [place] [facility];  Service: Cardiovascular;  Laterality: Right;  . AMPUTATION TOE Left 05/04/2017   Procedure: Left hallux amputation;  Surgeon: Wylene Simmer, MD;  Location: [place];  Service: Orthopedics;  Laterality: Left;  . AMPUTATION TOE Right 12/21/2017   Procedure: Right hallux amputation;  Surgeon: Wylene Simmer, MD;  Location: [place];  Service: Orthopedics;  Laterality: Right;  47min  . AMPUTATION TOE Right 02/15/2018   Procedure: Right 2nd toe amputation;  Surgeon: Wylene Simmer, MD;  Location: [place];  Service: Orthopedics;  Laterality: Right;  60  . BACK SURGERY  2000   post  . CARDIOVASCULAR STRESS TEST  11-14-2008   EF 44%  . EXPLORATORY LAPAROTOMY  1972   POST  . LOWER EXTREMITY ANGIOGRAPHY N/A 05/01/2017   Procedure: LOWER EXTREMITY ANGIOGRAPHY;  Surgeon: Waynetta Sandy, MD;  Location: [place] [facility];  Service: Cardiovascular;  Laterality: N/A;  . LOWER EXTREMITY ANGIOGRAPHY N/A 05/15/2017   Procedure: LOWER EXTREMITY ANGIOGRAPHY;  Surgeon: Waynetta Sandy, MD;  Location: [place] [facility];  Service: Cardiovascular;  Laterality: N/A;  . MULTIPLE TOOTH EXTRACTIONS    . PERIPHERAL VASCULAR ATHERECTOMY  05/01/2017   Procedure: PERIPHERAL VASCULAR ATHERECTOMY;  Surgeon: Waynetta Sandy, MD;  Location: [place] [facility];  Service: Cardiovascular;;  Left SFA  . PERIPHERAL VASCULAR BALLOON ANGIOPLASTY  05/01/2017   Procedure: PERIPHERAL VASCULAR BALLOON ANGIOPLASTY;  Surgeon: Waynetta Sandy, MD;  Location: [place] [facility];  Service: Cardiovascular;;  Left Anterial  Tibial Artery  . PERIPHERAL VASCULAR BALLOON ANGIOPLASTY Right 01/15/2018   Procedure: PERIPHERAL VASCULAR BALLOON ANGIOPLASTY;  Surgeon: Waynetta Sandy, MD;  Location: [place] [facility];  Service: Cardiovascular;  Laterality: Right;  SFA atherectomy  . PERIPHERAL VASCULAR INTERVENTION Right 05/15/2017   Procedure: PERIPHERAL VASCULAR INTERVENTION;  Surgeon: Waynetta Sandy, MD;  Location: [place] [facility];  Service: Cardiovascular;  Laterality: Right;  SFA Stent  . US ECHOCARDIOGRAPHY  11-25-2008   EF 55-60%    Current Medications: Outpatient Medications Prior to Visit  Medication Sig Dispense Refill  . albuterol (PROVENTIL) (2.5 MG/3ML) 0.083% nebulizer solution Take 2.5 mg by nebulization every 4 (four) hours as needed for wheezing or shortness of breath.     . allopurinol (ZYLOPRIM) 300 MG tablet Take 300 mg by mouth daily.   11  . amLODipine (NORVASC) 10 MG tablet Take 1 tablet (10 mg total) by mouth daily.    Marland Kitchen atorvastatin (LIPITOR) 40 MG tablet Take 1  tablet (40 mg total) by mouth daily. 90 tablet 3  . busPIRone (BUSPAR) 10 MG tablet Take 1 tablet (10 mg total) by mouth 2 (two) times daily.    . clopidogrel (PLAVIX) 75 MG tablet TAKE ONE TABLET EVERY MORNING 30 tablet 6  . doxazosin (CARDURA) 4 MG tablet Take 4 mg by mouth every evening.     . DULoxetine 40 MG CPEP Take 40 mg by mouth daily.    Marland Kitchen FLOVENT HFA 44 MCG/ACT inhaler Inhale 3 puffs into the lungs 2 (two) times daily.     Marland Kitchen gabapentin (NEURONTIN) 300 MG capsule Take 600 mg by mouth 2 (two) times daily.    Marland Kitchen glucagon 1 MG injection Inject 1 mg into the vein once as needed (for low blood sugars.).     . Insulin Glargine (BASAGLAR KWIKPEN) 100 UNIT/ML SOPN Inject 0.25 mLs (25 Units total) into the skin at bedtime.    . liraglutide (VICTOZA) 18 MG/3ML SOPN Inject 0.3 mg into the skin daily.    . Magnesium Oxide 400 (240 Mg) MG TABS Take 400 mg by mouth 2 (two) times daily.  5  . metoprolol succinate  (TOPROL-XL) 100 MG 24 hr tablet Take 150 mg by mouth daily.     . Multiple Vitamins-Minerals (CENTRUM SILVER ULTRA MENS PO) Take 1 tablet by mouth daily.  99  . nitroGLYCERIN (NITROSTAT) 0.4 MG SL tablet Place 0.4 mg under the tongue every 5 (five) minutes as needed for chest pain.     Marland Kitchen NOVOLOG FLEXPEN 100 UNIT/ML FlexPen Inject 0-9 Units into the skin 3 (three) times daily with meals. CBG < 70: implement hypoglycemia protocol CBG 70 - 120: 0 units CBG 121 - 150: 1 unit CBG 151 - 200: 2 units CBG 201 - 250: 3 units CBG 251 - 300: 5 units CBG 301 - 350: 7 units CBG 351 - 400: 9 units CBG > 400: call MD..    . Omega-3 Fatty Acids (FISH OIL) 1000 MG CAPS Take 1,000 mg by mouth at bedtime.    . polyethylene glycol (MIRALAX / GLYCOLAX) packet Take 17 g by mouth daily.    Marland Kitchen warfarin (COUMADIN) 2.5 MG tablet Take 2.5 mg by mouth every evening.   5  . acetaminophen (TYLENOL) 500 MG tablet Take 2 tablets (1,000 mg total) by mouth 3 (three) times daily. (Patient not taking: Reported on 10/31/2018)    . ipratropium (ATROVENT) 0.02 % nebulizer solution Take 0.5 mg by nebulization every 4 (four) hours as needed for wheezing or shortness of breath.     . naloxone (NARCAN) nasal spray 4 mg/0.1 mL Place 1 spray into the nose as needed (opioid overdose).     Marland Kitchen oxyCODONE (OXY IR/ROXICODONE) 5 MG immediate release tablet Take 0.5 tablets (2.5 mg total) by mouth every 6 (six) hours as needed for moderate pain or severe pain. (Patient not taking: Reported on 10/31/2018) 6 tablet 0   No facility-administered medications prior to visit.      Allergies:   Patient has no known allergies.   Social History   Socioeconomic History  . Marital status: Divorced    Spouse name: Not on file  . Number of children: 3  . Years of education: Not on file  . Highest education level: Not on file  Occupational History  . Occupation: disabled    Employer: RETIRED  Social Needs  . Financial resource strain: Not on file  .  Food insecurity    Worry: Not on file  Inability: Not on file  . Transportation needs    Medical: Not on file    Non-medical: Not on file  Tobacco Use  . Smoking status: Former Smoker    Years: 2.00    Quit date: 02/01/1980    Years since quitting: 38.7  . Smokeless tobacco: Never Used  Substance and Sexual Activity  . Alcohol use: Not Currently  . Drug use: Never  . Sexual activity: Not on file  Lifestyle  . Physical activity    Days per week: Not on file    Minutes per session: Not on file  . Stress: Not on file  Relationships  . Social Musicianconnections    Talks on phone: Not on file    Gets together: Not on file    Attends religious service: Not on file    Active member of club or organization: Not on file    Attends meetings of clubs or organizations: Not on file    Relationship status: Not on file  Other Topics Concern  . Not on file  Social History Narrative  . Not on file     Family History:  The patient's family history includes Coronary artery disease in his father and mother; Heart attack in his father and mother; Hypertension in his mother.   ROS:   Please see the history of present illness.    ROS All other systems reviewed and are negative.   PHYSICAL EXAM:   VS:  BP 119/70   Pulse 65   Temp (!) 96.9 F (36.1 C)   Ht 5\' 9"  (1.753 m)   Wt 247 lb (112 kg)   SpO2 98%   BMI 36.48 kg/m    GENERAL:  Well appearing, overweight WM in NAD.  HEENT:  PERRL, EOMI, sclera are clear. Oropharynx is clear. NECK:  No jugular venous distention, carotid upstroke brisk and symmetric, no bruits, no thyromegaly or adenopathy LUNGS:  Clear to auscultation bilaterally CHEST:  Unremarkable HEART:  RRR,  PMI not displaced or sustained,S1 and S2 within normal limits, no S3, no S4: no clicks, no rubs, no murmurs ABD:  Soft, nontender. BS +, no masses or bruits. No hepatomegaly, no splenomegaly EXT:  He is wearing compression hose, Tr  edema, no cyanosis no clubbing SKIN:  Warm  and dry.  No rashes NEURO:  Alert and oriented x 3. Cranial nerves II through XII intact. PSYCH:  Cognitively intact    Wt Readings from Last 3 Encounters:  10/31/18 247 lb (112 kg)  02/26/18 241 lb 2.9 oz (109.4 kg)  02/15/18 250 lb (113.4 kg)      Studies/Labs Reviewed:   EKG:  EKG is not ordered today.    Recent Labs: 02/21/2018: Magnesium 1.7 02/24/2018: ALT 34; BUN 16; Creatinine, Ser 1.35; Potassium 3.6; Sodium 136 02/26/2018: Hemoglobin 11.4; Platelets 293   Lipid Panel    Component Value Date/Time   CHOL 118 08/09/2017 1402   TRIG 150 (H) 08/09/2017 1402   HDL 25 (L) 08/09/2017 1402   CHOLHDL 4.7 08/09/2017 1402   CHOLHDL 8.5 11/27/2008 1038   VLDL 36 11/27/2008 1038   LDLCALC 63 08/09/2017 1402   Dated 10/06/17: Hgb 12.3. BMET normal. Cholesterol 124, triglycerides 104, HDL 26, LDL 77. A1c 6.5%  Additional studies/ records that were reviewed today include:   Myoview 07/28/2016 Study Highlights    Nuclear stress EF: 52%.  Small inferoapical defect that improves during recovery. Overall does not appear to represent significant ischemia. May represent soft tissue attenuation  Ovreall low risk scan     Echo 02/18/18: Study Conclusions  - Left ventricle: The cavity size was normal. Wall thickness was   normal. Systolic function was normal. The estimated ejection   fraction was in the range of 60% to 65%. Wall motion was normal;   there were no regional wall motion abnormalities. Doppler   parameters are consistent with abnormal left ventricular   relaxation (grade 1 diastolic dysfunction).   ASSESSMENT:    1. Coronary artery disease involving native coronary artery of native heart without angina pectoris   2. Essential hypertension   3. Paroxysmal atrial fibrillation (HCC)   4. PAD (peripheral artery disease) (HCC)   5. Pure hypercholesterolemia      PLAN:  In order of problems listed above:  1. PAD:  S/p left SFA PTA/stenting. No claudication. S/p  left hallux and second toe  amputation.  On Plavix for PV stent.   2. Recurrent DVT/PE: On Coumadin, managed by primary care providers office.  Reports INR therapeutic at 2.3.   3. CAD: s/p DES x 3 to proximal-mid RCA in 2010. No chest pain, last Myoview was in June 2018 which was low risk. During hospitalization he had demand ischemia due to multiple stressors. Normal EF by Echo.   4. Hypertension: Blood pressure is now well controlled on amlodipine.  5. Hyperlipidemia: On 40 mg daily of Lipitor. Labs followed by primary care.   6. DM 2: Managed by primary care provider, on insulin.   7.   Afib paroxysmal. Managed with rate control and anticoagulation. Asymptomatic.  8.  Respiratory failure secondary to COPD and OSA.   9. Left psoas hematoma. Resolved.   10. Syncope. May be related to transient hypotension versus arrhythmia. No recurrence in about 5 months. Will monitor for now. If he should have recurrent syncope would recommend an event monitor.     Medication Adjustments/Labs and Tests Ordered: Current medicines are reviewed at length with the patient today.  Concerns regarding medicines are outlined above.  Medication changes, Labs and Tests ordered today are listed in the Patient Instructions below. There are no Patient Instructions on file for this visit.   Signed, Jewel Mcafee Sw[place], MD  10/31/2018 2:45 PM    [facility] [address] [place], [place], [place]  [address] Phone: [phone]; Fax: [phone]

## 2018-10-31 ENCOUNTER — Encounter: Payer: Self-pay | Admitting: Cardiology

## 2018-10-31 ENCOUNTER — Other Ambulatory Visit: Payer: Self-pay

## 2018-10-31 ENCOUNTER — Ambulatory Visit (INDEPENDENT_AMBULATORY_CARE_PROVIDER_SITE_OTHER): Payer: Medicare Other | Admitting: Cardiology

## 2018-10-31 VITALS — BP 119/70 | HR 65 | Temp 96.9°F | Ht 69.0 in | Wt 247.0 lb

## 2018-10-31 DIAGNOSIS — I2510 Atherosclerotic heart disease of native coronary artery without angina pectoris: Secondary | ICD-10-CM | POA: Diagnosis not present

## 2018-10-31 DIAGNOSIS — I10 Essential (primary) hypertension: Secondary | ICD-10-CM | POA: Diagnosis not present

## 2018-10-31 DIAGNOSIS — I480 Paroxysmal atrial fibrillation: Secondary | ICD-10-CM | POA: Diagnosis not present

## 2018-10-31 DIAGNOSIS — I739 Peripheral vascular disease, unspecified: Secondary | ICD-10-CM

## 2018-10-31 DIAGNOSIS — E7800 Pure hypercholesterolemia, unspecified: Secondary | ICD-10-CM

## 2019-04-26 ENCOUNTER — Telehealth: Payer: Self-pay

## 2019-04-26 ENCOUNTER — Telehealth (INDEPENDENT_AMBULATORY_CARE_PROVIDER_SITE_OTHER): Payer: Medicare Other | Admitting: Physician Assistant

## 2019-04-26 VITALS — BP 117/74 | HR 78 | Ht 70.0 in | Wt 255.0 lb

## 2019-04-26 DIAGNOSIS — Z955 Presence of coronary angioplasty implant and graft: Secondary | ICD-10-CM

## 2019-04-26 DIAGNOSIS — I739 Peripheral vascular disease, unspecified: Secondary | ICD-10-CM

## 2019-04-26 DIAGNOSIS — E1165 Type 2 diabetes mellitus with hyperglycemia: Secondary | ICD-10-CM

## 2019-04-26 DIAGNOSIS — N183 Chronic kidney disease, stage 3 unspecified: Secondary | ICD-10-CM

## 2019-04-26 DIAGNOSIS — Z89422 Acquired absence of other left toe(s): Secondary | ICD-10-CM

## 2019-04-26 DIAGNOSIS — I2510 Atherosclerotic heart disease of native coronary artery without angina pectoris: Secondary | ICD-10-CM

## 2019-04-26 DIAGNOSIS — Z794 Long term (current) use of insulin: Secondary | ICD-10-CM

## 2019-04-26 DIAGNOSIS — E1151 Type 2 diabetes mellitus with diabetic peripheral angiopathy without gangrene: Secondary | ICD-10-CM

## 2019-04-26 DIAGNOSIS — R600 Localized edema: Secondary | ICD-10-CM | POA: Diagnosis not present

## 2019-04-26 DIAGNOSIS — Z7902 Long term (current) use of antithrombotics/antiplatelets: Secondary | ICD-10-CM

## 2019-04-26 DIAGNOSIS — I480 Paroxysmal atrial fibrillation: Secondary | ICD-10-CM

## 2019-04-26 DIAGNOSIS — I4891 Unspecified atrial fibrillation: Secondary | ICD-10-CM

## 2019-04-26 DIAGNOSIS — I10 Essential (primary) hypertension: Secondary | ICD-10-CM

## 2019-04-26 DIAGNOSIS — E785 Hyperlipidemia, unspecified: Secondary | ICD-10-CM

## 2019-04-26 DIAGNOSIS — E119 Type 2 diabetes mellitus without complications: Secondary | ICD-10-CM

## 2019-04-26 DIAGNOSIS — Z89421 Acquired absence of other right toe(s): Secondary | ICD-10-CM

## 2019-04-26 DIAGNOSIS — Z87891 Personal history of nicotine dependence: Secondary | ICD-10-CM

## 2019-04-26 DIAGNOSIS — I129 Hypertensive chronic kidney disease with stage 1 through stage 4 chronic kidney disease, or unspecified chronic kidney disease: Secondary | ICD-10-CM

## 2019-04-26 NOTE — Progress Notes (Signed)
Virtual Visit via Telephone Note   This visit type was conducted due to national recommendations for restrictions regarding the COVID-19 Pandemic (e.g. social distancing) in an effort to limit this patient's exposure and mitigate transmission in our community.  Due to his co-morbid illnesses, this patient is at least at moderate risk for complications without adequate follow up.  This format is felt to be most appropriate for this patient at this time.  The patient did not have access to video technology/had technical difficulties with video requiring transitioning to audio format only (telephone).  All issues noted in this document were discussed and addressed.  No physical exam could be performed with this format.  Please refer to the patient's chart for his  consent to telehealth for [facility].   The patient was identified using 2 identifiers.  Date:  04/28/2019   ID:  Paul, Chen Sep 09, 1940, MRN [id number]  Patient Location: Home Provider Location: Home  PCP:  Carmin Richmond, MD  Cardiologist:  Peter Swaziland, MD  Electrophysiologist:  None   Evaluation Performed:  Follow-Up Visit  Chief Complaint:  followup  History of Present Illness:    Paul Chen is a 79 y.o. male with PMH of COPD, CKD stage III, DM II, HTN, PAD, PAF, h/o DVT/PE 2010, moderate to severe pulmonary hypertension, CAD, OSA and obesity hypoventilation syndrome.  He had 3 sequential DES to proximal to mid RCA in 2010.  Repeat cardiac catheterization in 2011 showed patent stents.  Previous carotid Doppler showed moderate right ICA disease and mild left ICA disease.  Myoview in August 2018 showed EF 52%, small inferoapical defect that improved during recovery, does not appear to represent significant ischemia and may represent soft tissue attenuation.  He is being followed by [facility] for peripheral arterial disease involving right SFA, popliteal artery and tibial peroneal vessel.  In April 2019, he underwent  revascularization of the left SFA with PTA resulting in dissection and subsequent stenting.  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  Patient was contacted via telephone visit today.  He is able to ambulate with a walker however does not do much strenuous activity.  He denies any significant chest pain or shortness of breath.  He has chronic lower extremity edema which has not changed in the past year.  Otherwise he has no orthopnea or PND.   The patient does not have symptoms concerning for COVID-19 infection (fever, chills, cough, or new shortness of breath).    Past Medical History:  Diagnosis Date  . Arthritis   . Chronic renal insufficiency   . CKD (chronic kidney disease), stage III (HCC)   . COPD (chronic obstructive pulmonary disease) (HCC)    with restriction  . Coronary disease October 2010   Status post stenting of the proximal to mid right coronary with DES.  Status post extensive stenting of the proximal right coronary to the crux.  He has moderate disease in  the left coronary system  . Depression   . Diabetes mellitus    Insulin Dependent  . Diastolic dysfunction    Congestive heart failure with  . Dyspnea    multifactorial. chronic, 11/18/9- not an issue at this time  . Gout   . History of blood transfusion   . History of diabetic ulcer of foot    right  . Hypertension   . Hypoventilation    syndrome  . Obesity   . Pneumonia   .  Pulmonary embolism [facility]) March 2010   hx of and DVT in March of 2010 on chronic Coumadin therapy  . Pulmonary hypertension (HCC)    Moderate to severe  . Retroperitoneal bleed    Hx of right pelvic  . Sleep apnea    Obstructive , no CPAP  . Wears dentures    Past Surgical History:  Procedure Laterality Date  . ABDOMINAL AORTOGRAM W/LOWER EXTREMITY Right 01/15/2018   Procedure: ABDOMINAL AORTOGRAM W/LOWER EXTREMITY;  Surgeon: Waynetta Sandy, MD;  Location: [place] [facility];   Service: Cardiovascular;  Laterality: Right;  . AMPUTATION TOE Left 05/04/2017   Procedure: Left hallux amputation;  Surgeon: Wylene Simmer, MD;  Location: [place];  Service: Orthopedics;  Laterality: Left;  . AMPUTATION TOE Right 12/21/2017   Procedure: Right hallux amputation;  Surgeon: Wylene Simmer, MD;  Location: [place];  Service: Orthopedics;  Laterality: Right;  47min  . AMPUTATION TOE Right 02/15/2018   Procedure: Right 2nd toe amputation;  Surgeon: Wylene Simmer, MD;  Location: [place];  Service: Orthopedics;  Laterality: Right;  60  . BACK SURGERY  2000   post  . CARDIOVASCULAR STRESS TEST  11-14-2008   EF 44%  . EXPLORATORY LAPAROTOMY  1972   POST  . LOWER EXTREMITY ANGIOGRAPHY N/A 05/01/2017   Procedure: LOWER EXTREMITY ANGIOGRAPHY;  Surgeon: Waynetta Sandy, MD;  Location: [place] [facility];  Service: Cardiovascular;  Laterality: N/A;  . LOWER EXTREMITY ANGIOGRAPHY N/A 05/15/2017   Procedure: LOWER EXTREMITY ANGIOGRAPHY;  Surgeon: Waynetta Sandy, MD;  Location: [place] [facility];  Service: Cardiovascular;  Laterality: N/A;  . MULTIPLE TOOTH EXTRACTIONS    . PERIPHERAL VASCULAR ATHERECTOMY  05/01/2017   Procedure: PERIPHERAL VASCULAR ATHERECTOMY;  Surgeon: Waynetta Sandy, MD;  Location: [place] [facility];  Service: Cardiovascular;;  Left SFA  . PERIPHERAL VASCULAR BALLOON ANGIOPLASTY  05/01/2017   Procedure: PERIPHERAL VASCULAR BALLOON ANGIOPLASTY;  Surgeon: Waynetta Sandy, MD;  Location: [place] [facility];  Service: Cardiovascular;;  Left Anterial Tibial Artery  . PERIPHERAL VASCULAR BALLOON ANGIOPLASTY Right 01/15/2018   Procedure: PERIPHERAL VASCULAR BALLOON ANGIOPLASTY;  Surgeon: Waynetta Sandy, MD;  Location: [place] [facility];  Service: Cardiovascular;  Laterality: Right;  SFA atherectomy  . PERIPHERAL VASCULAR INTERVENTION Right 05/15/2017   Procedure: PERIPHERAL VASCULAR INTERVENTION;   Surgeon: Waynetta Sandy, MD;  Location: [place] [facility];  Service: Cardiovascular;  Laterality: Right;  SFA Stent  . US ECHOCARDIOGRAPHY  11-25-2008   EF 55-60%     Current Meds  Medication Sig  . acetaminophen (TYLENOL) 500 MG tablet Take 2 tablets (1,000 mg total) by mouth 3 (three) times daily.  Marland Kitchen albuterol (PROVENTIL) (2.5 MG/3ML) 0.083% nebulizer solution Take 2.5 mg by nebulization every 4 (four) hours as needed for wheezing or shortness of breath.   . allopurinol (ZYLOPRIM) 300 MG tablet Take 300 mg by mouth daily.   Marland Kitchen amLODipine (NORVASC) 10 MG tablet Take 1 tablet (10 mg total) by mouth daily.  Marland Kitchen atorvastatin (LIPITOR) 40 MG tablet Take 1 tablet (40 mg total) by mouth daily.  . busPIRone (BUSPAR) 10 MG tablet Take 1 tablet (10 mg total) by mouth 2 (two) times daily.  . clopidogrel (PLAVIX) 75 MG tablet TAKE ONE TABLET EVERY MORNING  . doxazosin (CARDURA) 4 MG tablet Take 4 mg by mouth every evening.   . DULoxetine 40 MG CPEP Take 40 mg by mouth daily.  Marland Kitchen FLOVENT HFA 44 MCG/ACT  inhaler Inhale 3 puffs into the lungs 2 (two) times daily.   Marland Kitchen gabapentin (NEURONTIN) 300 MG capsule Take 900 mg by mouth 2 (two) times daily.   Marland Kitchen glucagon 1 MG injection Inject 1 mg into the vein once as needed (for low blood sugars.).   . Insulin Glargine (BASAGLAR KWIKPEN) 100 UNIT/ML SOPN Inject 0.25 mLs (25 Units total) into the skin at bedtime. (Patient taking differently: Inject 30 Units into the skin at bedtime. )  . ipratropium (ATROVENT) 0.02 % nebulizer solution Take 0.5 mg by nebulization every 4 (four) hours as needed for wheezing or shortness of breath.   . liraglutide (VICTOZA) 18 MG/3ML SOPN Inject 0.3 mg into the skin daily.  . Magnesium Oxide 400 (240 Mg) MG TABS Take 400 mg by mouth 2 (two) times daily.  . metoprolol succinate (TOPROL-XL) 100 MG 24 hr tablet Take 150 mg by mouth daily.   . Multiple Vitamins-Minerals (CENTRUM SILVER ULTRA MENS PO) Take 1 tablet by mouth daily.    . nitroGLYCERIN (NITROSTAT) 0.4 MG SL tablet Place 0.4 mg under the tongue every 5 (five) minutes as needed for chest pain.   Marland Kitchen NOVOLOG FLEXPEN 100 UNIT/ML FlexPen Inject 0-9 Units into the skin 3 (three) times daily with meals. CBG < 70: implement hypoglycemia protocol CBG 70 - 120: 0 units CBG 121 - 150: 1 unit CBG 151 - 200: 2 units CBG 201 - 250: 3 units CBG 251 - 300: 5 units CBG 301 - 350: 7 units CBG 351 - 400: 9 units CBG > 400: call MD..  . Omega-3 Fatty Acids (FISH OIL) 1000 MG CAPS Take 1,000 mg by mouth at bedtime.  . polyethylene glycol (MIRALAX / GLYCOLAX) packet Take 17 g by mouth daily.     Allergies:   Patient has no known allergies.   Social History   Tobacco Use  . Smoking status: Former Smoker    Years: 2.00    Quit date: 02/01/1980    Years since quitting: 39.2  . Smokeless tobacco: Never Used  Substance Use Topics  . Alcohol use: Not Currently  . Drug use: Never     Family Hx: The patient's family history includes Coronary artery disease in his father and mother; Heart attack in his father and mother; Hypertension in his mother.  ROS:   Please see the history of present illness.     All other systems reviewed and are negative.   Prior CV studies:   The following studies were reviewed today:  Echo 02/18/2018 LV EF: 60% -  65%   -------------------------------------------------------------------  Indications:   Dyspnea 786.09.   -------------------------------------------------------------------  History:  Risk factors: Obstructive sleep apnea. History of  A-Fib. Hypertension. Diabetes mellitus. Dyslipidemia.   -------------------------------------------------------------------  Study Conclusions   - Left ventricle: The cavity size was normal. Wall thickness was  normal. Systolic function was normal. The estimated ejection  fraction was in the range of 60% to 65%. Wall motion was normal;  there were no regional wall motion  abnormalities. Doppler  parameters are consistent with abnormal left ventricular  relaxation (grade 1 diastolic dysfunction).   Labs/Other Tests and Data Reviewed:    EKG:  An ECG dated 02/18/2018 was personally reviewed today and demonstrated:  Sinus rhythm with T wave inversion in the anterolateral leads  Recent Labs: No results found for requested labs within last 8760 hours.   Recent Lipid Panel Lab Results  Component Value Date/Time   CHOL 118 08/09/2017 02:02 PM   TRIG  150 (H) 08/09/2017 02:02 PM   HDL 25 (L) 08/09/2017 02:02 PM   CHOLHDL 4.7 08/09/2017 02:02 PM   CHOLHDL 8.5 11/27/2008 10:38 AM   LDLCALC 63 08/09/2017 02:02 PM    Wt Readings from Last 3 Encounters:  04/26/19 255 lb (115.7 kg)  10/31/18 247 lb (112 kg)  02/26/18 241 lb 2.9 oz (109.4 kg)     Objective:    Vital Signs:  BP 117/74   Pulse 78   Ht 5\' 10"  (1.778 m)   Wt 255 lb (115.7 kg)   BMI 36.59 kg/m    VITAL SIGNS:  reviewed  ASSESSMENT & PLAN:    1. CAD: Denies any recent chest pain.  Continue Plavix and Lipitor  2. PAF: On Coumadin and metoprolol.  Denies any palpitation  3. PAD: Status post multiple toe amputation.  4. Hypertension: Continue current blood pressure medication 5. Hyperlipidemia: On Lipitor  6. DM 2: On insulin.  Managed by primary care provider  COVID-19 Education: The signs and symptoms of COVID-19 were discussed with the patient and how to seek care for testing (follow up with PCP or arrange E-visit).  The importance of social distancing was discussed today.  Time:   Today, I have spent 7 minutes with the patient with telehealth technology discussing the above problems.     Medication Adjustments/Labs and Tests Ordered: Current medicines are reviewed at length with the patient today.  Concerns regarding medicines are outlined above.   Tests Ordered: No orders of the defined types were placed in this encounter.   Medication Changes: No orders of the  defined types were placed in this encounter.   Follow Up:  Either In Person or Virtual in 6 month(s)  Signed, , Azalee Course  04/28/2019 11:46 PM    [facility]

## 2019-04-26 NOTE — Telephone Encounter (Signed)
Called patient to discuss AVS instructions gave Hao Meng's recommendations and patient voiced understanding. AVS summary mailed to patient.    

## 2019-04-26 NOTE — Telephone Encounter (Signed)
  Patient Consent for Virtual Visit         Paul Chen has provided verbal consent on 04/26/2019 for a virtual visit (video or telephone).   CONSENT FOR VIRTUAL VISIT FOR:  Paul Chen  By participating in this virtual visit I agree to the following:  I hereby voluntarily request, consent and authorize [facility] and its employed or contracted physicians, physician assistants, nurse practitioners or other licensed health care professionals (the Practitioner), to provide me with telemedicine health care services (the "Services") as deemed necessary by the treating Practitioner. I acknowledge and consent to receive the Services by the Practitioner via telemedicine. I understand that the telemedicine visit will involve communicating with the Practitioner through live audiovisual communication technology and the disclosure of certain medical information by electronic transmission. I acknowledge that I have been given the opportunity to request an in-person assessment or other available alternative prior to the telemedicine visit and am voluntarily participating in the telemedicine visit.  I understand that I have the right to withhold or withdraw my consent to the use of telemedicine in the course of my care at any time, without affecting my right to future care or treatment, and that the Practitioner or I may terminate the telemedicine visit at any time. I understand that I have the right to inspect all information obtained and/or recorded in the course of the telemedicine visit and may receive copies of available information for a reasonable fee.  I understand that some of the potential risks of receiving the Services via telemedicine include:  Marland Kitchen Delay or interruption in medical evaluation due to technological equipment failure or disruption; . Information transmitted may not be sufficient (e.g. poor resolution of images) to allow for appropriate medical decision making by the Practitioner;  and/or  . In rare instances, security protocols could fail, causing a breach of personal health information.  Furthermore, I acknowledge that it is my responsibility to provide information about my medical history, conditions and care that is complete and accurate to the best of my ability. I acknowledge that Practitioner's advice, recommendations, and/or decision may be based on factors not within their control, such as incomplete or inaccurate data provided by me or distortions of diagnostic images or specimens that may result from electronic transmissions. I understand that the practice of medicine is not an exact science and that Practitioner makes no warranties or guarantees regarding treatment outcomes. I acknowledge that a copy of this consent can be made available to me via my patient portal [facility] MyChart), or I can request a printed copy by calling the office of [facility].    I understand that my insurance will be billed for this visit.   I have read or had this consent read to me. . I understand the contents of this consent, which adequately explains the benefits and risks of the Services being provided via telemedicine.  . I have been provided ample opportunity to ask questions regarding this consent and the Services and have had my questions answered to my satisfaction. . I give my informed consent for the services to be provided through the use of telemedicine in my medical care

## 2019-04-26 NOTE — Patient Instructions (Signed)

## 2019-05-05 ENCOUNTER — Other Ambulatory Visit: Payer: Self-pay | Admitting: Cardiology

## 2019-06-28 ENCOUNTER — Ambulatory Visit: Payer: Medicare Other | Admitting: Vascular Surgery

## 2019-06-28 ENCOUNTER — Other Ambulatory Visit: Payer: Self-pay

## 2019-06-28 ENCOUNTER — Encounter: Payer: Self-pay | Admitting: Vascular Surgery

## 2019-06-28 VITALS — BP 129/72 | HR 64 | Temp 97.9°F | Resp 20 | Ht 70.0 in | Wt 254.0 lb

## 2019-06-28 DIAGNOSIS — I7025 Atherosclerosis of native arteries of other extremities with ulceration: Secondary | ICD-10-CM | POA: Diagnosis not present

## 2019-06-28 NOTE — Progress Notes (Signed)
Patient ID: Paul Chen, male   DOB: 1940/07/29, 79 y.o.   MRN: [id number]  Reason for Consult: Follow-up   Referred by Carmin Richmond, MD  Subjective:     HPI:  Paul Chen is a 79 y.o. male whom I know from previous bilateral lower extremity interventions including stenting of his right SFA.  I have not seen him in over a year and a half.  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  These are well-healed.  He does have swelling of his bilateral lower extremities and some drainage of his ankle areas.  Is never had venous intervention to his knowledge but we have checked his venous reflux studies in the past.  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  He does walk with help of a cane.  He does not have any foot ulceration at this time.  Recently had ABIs performed elsewhere.  Past Medical History:  Diagnosis Date  . Arthritis   . Chronic renal insufficiency   . CKD (chronic kidney disease), stage III   . COPD (chronic obstructive pulmonary disease) (HCC)    with restriction  . Coronary disease October 2010   Status post stenting of the proximal to mid right coronary with DES.  Status post extensive stenting of the proximal right coronary to the crux.  He has moderate disease in  the left coronary system  . Depression   . Diabetes mellitus    Insulin Dependent  . Diastolic dysfunction    Congestive heart failure with  . Dyspnea    multifactorial. chronic, 11/18/9- not an issue at this time  . Gout   . History of blood transfusion   . History of diabetic ulcer of foot    right  . Hypertension   . Hypoventilation    syndrome  . Obesity   . Pneumonia   . Pulmonary embolism [facility]) March 2010   hx of and DVT in March of 2010 on chronic Coumadin therapy  . Pulmonary hypertension (HCC)    Moderate to severe  . Retroperitoneal bleed    Hx of right pelvic  . Sleep apnea    Obstructive , no CPAP  .  Wears dentures    Family History  Problem Relation Age of Onset  . Heart attack Mother   . Coronary artery disease Mother   . Hypertension Mother   . Coronary artery disease Father   . Heart attack Father    Past Surgical History:  Procedure Laterality Date  . ABDOMINAL AORTOGRAM W/LOWER EXTREMITY Right 01/15/2018   Procedure: ABDOMINAL AORTOGRAM W/LOWER EXTREMITY;  Surgeon: Maeola Harman, MD;  Location: [place] INVASIVE CV LAB;  Service: Cardiovascular;  Laterality: Right;  . AMPUTATION TOE Left 05/04/2017   Procedure: Left hallux amputation;  Surgeon: Toni Arthurs, MD;  Location: [facility];  Service: Orthopedics;  Laterality: Left;  . AMPUTATION TOE Right 12/21/2017   Procedure: Right hallux amputation;  Surgeon: Toni Arthurs, MD;  Location: [facility];  Service: Orthopedics;  Laterality: Right;   . AMPUTATION TOE Right 02/15/2018   Procedure: Right 2nd toe amputation;  Surgeon: Toni Arthurs, MD;  Location: [facility] OR;  Service: Orthopedics;  Laterality: Right;  62  . BACK SURGERY  2000   post  . CARDIOVASCULAR STRESS TEST  11-14-2008   EF 44%  . EXPLORATORY LAPAROTOMY  1972   POST  . LOWER EXTREMITY  ANGIOGRAPHY N/A 05/01/2017   Procedure: LOWER EXTREMITY ANGIOGRAPHY;  Surgeon: Maeola Harman, MD;  Location: [facility] INVASIVE CV LAB;  Service: Cardiovascular;  Laterality: N/A;  . LOWER EXTREMITY ANGIOGRAPHY N/A 05/15/2017   Procedure: LOWER EXTREMITY ANGIOGRAPHY;  Surgeon: Maeola Harman, MD;  Location: [facility] INVASIVE CV LAB;  Service: Cardiovascular;  Laterality: N/A;  . MULTIPLE TOOTH EXTRACTIONS    . PERIPHERAL VASCULAR ATHERECTOMY  05/01/2017   Procedure: PERIPHERAL VASCULAR ATHERECTOMY;  Surgeon: Maeola Harman, MD;  Location: [facility] INVASIVE CV LAB;  Service: Cardiovascular;;  Left SFA  . PERIPHERAL VASCULAR BALLOON ANGIOPLASTY  05/01/2017   Procedure: PERIPHERAL VASCULAR BALLOON ANGIOPLASTY;  Surgeon: Maeola Harman, MD;  Location: [facility] INVASIVE CV LAB;  Service: Cardiovascular;;  Left Anterial Tibial Artery  . PERIPHERAL VASCULAR BALLOON ANGIOPLASTY Right 01/15/2018   Procedure: PERIPHERAL VASCULAR BALLOON ANGIOPLASTY;  Surgeon: Maeola Harman, MD;  Location: [facility] INVASIVE CV LAB;  Service: Cardiovascular;  Laterality: Right;  SFA atherectomy  . PERIPHERAL VASCULAR INTERVENTION Right 05/15/2017   Procedure: PERIPHERAL VASCULAR INTERVENTION;  Surgeon: Maeola Harman, MD;  Location: [facility] INVASIVE CV LAB;  Service: Cardiovascular;  Laterality: Right;  SFA Stent  . US ECHOCARDIOGRAPHY  11-25-2008   EF 55-60%    Short Social History:  Social History   Tobacco Use  . Smoking status: Former Smoker    Years: 2.00    Quit date: 02/01/1980    Years since quitting: 39.4  . Smokeless tobacco: Never Used  Substance Use Topics  . Alcohol use: Not Currently    No Known Allergies  Current Outpatient Medications  Medication Sig Dispense Refill  . acetaminophen (TYLENOL) 500 MG tablet Take 2 tablets (1,000 mg total) by mouth 3 (three) times daily.    Marland Kitchen albuterol (PROVENTIL) (2.5 MG/3ML) 0.083% nebulizer solution Take 2.5 mg by nebulization every 4 (four) hours as needed for wheezing or shortness of breath.     . allopurinol (ZYLOPRIM) 300 MG tablet Take 300 mg by mouth daily.   11  . atorvastatin (LIPITOR) 40 MG tablet Take 1 tablet (40 mg total) by mouth daily. 90 tablet 3  . Buprenorphine HCl-Naloxone HCl 4-1 MG FILM DISSOLVE 1 FILM UNDER THE TONGUE EVERY DAY FOR 15 DAYS THEN EVERY 12 HOURS FOR 15 DAYS    . busPIRone (BUSPAR) 10 MG tablet Take 1 tablet (10 mg total) by mouth 2 (two) times daily.    . clopidogrel (PLAVIX) 75 MG tablet TAKE ONE TABLET BY MOUTH EVERY MORNING 30 tablet 6  . doxazosin (CARDURA) 4 MG tablet Take 4 mg by mouth every evening.     . DULoxetine 40 MG CPEP Take 40 mg by mouth daily.    Marland Kitchen FLOVENT HFA 44 MCG/ACT inhaler Inhale 3 puffs into the lungs 2 (two) times  daily.     . furosemide (LASIX) 20 MG tablet Take 40 mg by mouth every morning.    . gabapentin (NEURONTIN) 300 MG capsule Take 900 mg by mouth 2 (two) times daily.     Marland Kitchen glucagon 1 MG injection Inject 1 mg into the vein once as needed (for low blood sugars.).     . Insulin Glargine (BASAGLAR KWIKPEN) 100 UNIT/ML SOPN Inject 0.25 mLs (25 Units total) into the skin at bedtime. (Patient taking differently: Inject 30 Units into the skin at bedtime. )    . ipratropium (ATROVENT) 0.02 % nebulizer solution Take 0.5 mg by nebulization every 4 (four) hours as needed for wheezing or shortness of breath.     Marland Kitchen  liraglutide (VICTOZA) 18 MG/3ML SOPN Inject 0.3 mg into the skin daily.    Marland Kitchen lisinopril (ZESTRIL) 10 MG tablet Take 10 mg by mouth every morning.    . Magnesium Oxide 400 (240 Mg) MG TABS Take 400 mg by mouth 2 (two) times daily.  5  . metoprolol succinate (TOPROL-XL) 100 MG 24 hr tablet Take 150 mg by mouth daily.     . Multiple Vitamins-Minerals (CENTRUM SILVER ULTRA MENS PO) Take 1 tablet by mouth daily.  99  . naloxone (NARCAN) nasal spray 4 mg/0.1 mL Place 1 spray into the nose as needed (opioid overdose).     . nitroGLYCERIN (NITROSTAT) 0.4 MG SL tablet Place 0.4 mg under the tongue every 5 (five) minutes as needed for chest pain.     Marland Kitchen NOVOLOG FLEXPEN 100 UNIT/ML FlexPen Inject 0-9 Units into the skin 3 (three) times daily with meals. CBG < 70: implement hypoglycemia protocol CBG 70 - 120: 0 units CBG 121 - 150: 1 unit CBG 151 - 200: 2 units CBG 201 - 250: 3 units CBG 251 - 300: 5 units CBG 301 - 350: 7 units CBG 351 - 400: 9 units CBG > 400: call MD..    . Omega-3 Fatty Acids (FISH OIL) 1000 MG CAPS Take 1,000 mg by mouth at bedtime.    . polyethylene glycol (MIRALAX / GLYCOLAX) packet Take 17 g by mouth daily.    . traMADol (ULTRAM) 50 MG tablet Take by mouth.    . warfarin (COUMADIN) 2.5 MG tablet Take 2.5 mg by mouth every evening. 2.5MG  EOD 3MG  EOD  5  . amLODipine (NORVASC) 10 MG  tablet Take 1 tablet (10 mg total) by mouth daily.    Marland Kitchen oxyCODONE (OXY IR/ROXICODONE) 5 MG immediate release tablet Take 0.5 tablets (2.5 mg total) by mouth every 6 (six) hours as needed for moderate pain or severe pain. (Patient not taking: Reported on 06/28/2019) 6 tablet 0   No current facility-administered medications for this visit.    Review of Systems  Constitutional:  Constitutional negative. HENT: HENT negative.  Eyes: Eyes negative.  Respiratory: Respiratory negative.  Cardiovascular: Positive for leg swelling.  Musculoskeletal: Positive for leg pain.  Skin: Positive for wound.  Neurological: Positive for numbness.  Hematologic: Hematologic/lymphatic negative.  Psychiatric: Psychiatric negative.        Objective:  Objective   Vitals:   06/28/19 1148  BP: 129/72  Pulse: 64  Resp: 20  Temp: 97.9 F (36.6 C)  SpO2: 98%  Weight: 254 lb (115.2 kg)  Height: 5\' 10"  (1.778 m)   Body mass index is 36.45 kg/m.  Physical Exam HENT:     Head: Normocephalic.  Neck:     Vascular: No carotid bruit.  Cardiovascular:     Rate and Rhythm: Normal rate.     Pulses:          Femoral pulses are 2+ on the right side and 2+ on the left side.      Popliteal pulses are 0 on the right side and 0 on the left side.       Dorsalis pedis pulses are detected w/ Doppler on the left side.       Posterior tibial pulses are detected w/ Doppler on the right side.  Pulmonary:     Effort: Pulmonary effort is normal.     Breath sounds: Normal breath sounds.  Abdominal:     General: Abdomen is flat.     Palpations: Abdomen is soft.  Musculoskeletal:     Right lower leg: 2+ Edema present.     Left lower leg: 2+ Edema present.  Skin:    Capillary Refill: Capillary refill takes 2 to 3 seconds.  Neurological:     General: No focal deficit present.     Mental Status: He is alert.  Psychiatric:        Mood and Affect: Mood normal.        Behavior: Behavior normal.        Thought Content:  Thought content normal.        Judgment: Judgment normal.     Data: Bilateral ABIs were interpreted on April 5 at 0.8.     Assessment/Plan:     79 year old male with history of bilateral lower extremity vascular invention by myself.  He also has toe amputations which are well-healed.  He does have significant swelling of his legs with some drainage this would be more consistent with venous disease but I would be reticent to perform any venous intervention given his underlying arterial disease.  He has known reflux at the right greater saphenous vein from previous testing in our office.  In fact today he does not have palpable popliteal pulses only has signals of the right PT and left DP which are monophasic although previous ABIs were 0.8.  I recommended compression stockings but he does not think he will be able to get these on given that he has difficulty just getting socks on.  I will get him back in a few months we will check his arterial duplexes of his previous SFA interventions.  If he still has wounds we may need to consider reintervention of his bilateral lower extremities.     Maeola Harman MD [facility]

## 2019-07-02 ENCOUNTER — Other Ambulatory Visit: Payer: Self-pay | Admitting: *Deleted

## 2019-07-02 DIAGNOSIS — I872 Venous insufficiency (chronic) (peripheral): Secondary | ICD-10-CM

## 2019-07-02 DIAGNOSIS — I7025 Atherosclerosis of native arteries of other extremities with ulceration: Secondary | ICD-10-CM

## 2019-09-27 ENCOUNTER — Encounter (HOSPITAL_COMMUNITY): Payer: Medicare Other

## 2019-09-27 ENCOUNTER — Ambulatory Visit: Payer: Medicare Other | Admitting: Vascular Surgery

## 2019-11-01 ENCOUNTER — Other Ambulatory Visit: Payer: Self-pay

## 2019-11-01 ENCOUNTER — Ambulatory Visit (HOSPITAL_COMMUNITY)
Admission: RE | Admit: 2019-11-01 | Discharge: 2019-11-01 | Disposition: A | Discharge status: Home / Self Care | Payer: Medicare Other | Source: Ambulatory Visit | Attending: Vascular Surgery | Admitting: Vascular Surgery

## 2019-11-01 ENCOUNTER — Ambulatory Visit (INDEPENDENT_AMBULATORY_CARE_PROVIDER_SITE_OTHER): Payer: Medicare Other | Admitting: Vascular Surgery

## 2019-11-01 ENCOUNTER — Ambulatory Visit (INDEPENDENT_AMBULATORY_CARE_PROVIDER_SITE_OTHER)
Admission: RE | Admit: 2019-11-01 | Discharge: 2019-11-01 | Disposition: A | Discharge status: Home / Self Care | Payer: Medicare Other | Source: Ambulatory Visit | Attending: Vascular Surgery | Admitting: Vascular Surgery

## 2019-11-01 ENCOUNTER — Encounter: Payer: Self-pay | Admitting: Vascular Surgery

## 2019-11-01 VITALS — BP 142/64 | HR 64 | Temp 98.3°F | Resp 20 | Ht 70.0 in | Wt 264.0 lb

## 2019-11-01 DIAGNOSIS — I872 Venous insufficiency (chronic) (peripheral): Secondary | ICD-10-CM | POA: Diagnosis present

## 2019-11-01 DIAGNOSIS — I7025 Atherosclerosis of native arteries of other extremities with ulceration: Secondary | ICD-10-CM | POA: Insufficient documentation

## 2019-11-01 NOTE — Progress Notes (Signed)
Patient ID: Paul Chen, male   DOB: 10-16-1940, 79 y.o.   MRN: [id number]  Reason for Consult: Follow-up   Referred by Carmin Richmond, MD  Subjective:     HPI:  Paul Chen is a 79 y.o. male well-known to me from previous bilateral lower extremity interventions.  He had right-sided palpitations which are well-healed.  Chief complaint now is swelling of his bilateral lower extremities.  He has never had venous intervention given the underlying arterial intervention and elected not to pursue this.  He showed up today for evaluation of his bilateral lower extremity legs with bilateral arterial duplexes.  He continues to have significant discomfort in his legs.  He does take Coumadin.  Past Medical History:  Diagnosis Date  . Arthritis   . Chronic renal insufficiency   . CKD (chronic kidney disease), stage III (HCC)   . COPD (chronic obstructive pulmonary disease) (HCC)    with restriction  . Coronary disease October 2010   Status post stenting of the proximal to mid right coronary with DES.  Status post extensive stenting of the proximal right coronary to the crux.  He has moderate disease in  the left coronary system  . Depression   . Diabetes mellitus    Insulin Dependent  . Diastolic dysfunction    Congestive heart failure with  . Dyspnea    multifactorial. chronic, 11/18/9- not an issue at this time  . Gout   . History of blood transfusion   . History of diabetic ulcer of foot    right  . Hypertension   . Hypoventilation    syndrome  . Obesity   . Pneumonia   . Pulmonary embolism [facility]) March 2010   hx of and DVT in March of 2010 on chronic Coumadin therapy  . Pulmonary hypertension (HCC)    Moderate to severe  . Retroperitoneal bleed    Hx of right pelvic  . Sleep apnea    Obstructive , no CPAP  . Wears dentures    Family History  Problem Relation Age of Onset  . Heart attack Mother   . Coronary artery disease Mother   . Hypertension Mother   . Coronary  artery disease Father   . Heart attack Father    Past Surgical History:  Procedure Laterality Date  . ABDOMINAL AORTOGRAM W/LOWER EXTREMITY Right 01/15/2018   Procedure: ABDOMINAL AORTOGRAM W/LOWER EXTREMITY;  Surgeon: Maeola Harman, MD;  Location: [facility] INVASIVE CV LAB;  Service: Cardiovascular;  Laterality: Right;  . AMPUTATION TOE Left 05/04/2017   Procedure: Left hallux amputation;  Surgeon: Toni Arthurs, MD;  Location: [facility];  Service: Orthopedics;  Laterality: Left;  . AMPUTATION TOE Right 12/21/2017   Procedure: Right hallux amputation;  Surgeon: Toni Arthurs, MD;  Location: [facility];  Service: Orthopedics;  Laterality: Right;   . AMPUTATION TOE Right 02/15/2018   Procedure: Right 2nd toe amputation;  Surgeon: Toni Arthurs, MD;  Location: [facility] OR;  Service: Orthopedics;  Laterality: Right;  60  . BACK SURGERY  2000   post  . CARDIOVASCULAR STRESS TEST  11-14-2008   EF 44%  . EXPLORATORY LAPAROTOMY  1972   POST  . LOWER EXTREMITY ANGIOGRAPHY N/A 05/01/2017   Procedure: LOWER EXTREMITY ANGIOGRAPHY;  Surgeon: Maeola Harman, MD;  Location: [facility] INVASIVE CV LAB;  Service: Cardiovascular;  Laterality: N/A;  . LOWER EXTREMITY ANGIOGRAPHY N/A 05/15/2017   Procedure: LOWER EXTREMITY ANGIOGRAPHY;  Surgeon: Maeola Harman,  MD;  Location: [facility] INVASIVE CV LAB;  Service: Cardiovascular;  Laterality: N/A;  . MULTIPLE TOOTH EXTRACTIONS    . PERIPHERAL VASCULAR ATHERECTOMY  05/01/2017   Procedure: PERIPHERAL VASCULAR ATHERECTOMY;  Surgeon: Maeola Harman, MD;  Location: [facility] INVASIVE CV LAB;  Service: Cardiovascular;;  Left SFA  . PERIPHERAL VASCULAR BALLOON ANGIOPLASTY  05/01/2017   Procedure: PERIPHERAL VASCULAR BALLOON ANGIOPLASTY;  Surgeon: Maeola Harman, MD;  Location: [facility] INVASIVE CV LAB;  Service: Cardiovascular;;  Left Anterial Tibial Artery  . PERIPHERAL VASCULAR BALLOON ANGIOPLASTY Right 01/15/2018    Procedure: PERIPHERAL VASCULAR BALLOON ANGIOPLASTY;  Surgeon: Maeola Harman, MD;  Location: [facility] INVASIVE CV LAB;  Service: Cardiovascular;  Laterality: Right;  SFA atherectomy  . PERIPHERAL VASCULAR INTERVENTION Right 05/15/2017   Procedure: PERIPHERAL VASCULAR INTERVENTION;  Surgeon: Maeola Harman, MD;  Location: [facility] INVASIVE CV LAB;  Service: Cardiovascular;  Laterality: Right;  SFA Stent  . US ECHOCARDIOGRAPHY  11-25-2008   EF 55-60%    Short Social History:  Social History   Tobacco Use  . Smoking status: Former Smoker    Years: 2.00    Quit date: 02/01/1980    Years since quitting: 39.7  . Smokeless tobacco: Never Used  Substance Use Topics  . Alcohol use: Not Currently    No Known Allergies  Current Outpatient Medications  Medication Sig Dispense Refill  . acetaminophen (TYLENOL) 500 MG tablet Take 2 tablets (1,000 mg total) by mouth 3 (three) times daily.    Marland Kitchen albuterol (PROVENTIL) (2.5 MG/3ML) 0.083% nebulizer solution Take 2.5 mg by nebulization every 4 (four) hours as needed for wheezing or shortness of breath.     . allopurinol (ZYLOPRIM) 300 MG tablet Take 300 mg by mouth daily.   11  . atorvastatin (LIPITOR) 40 MG tablet Take 1 tablet (40 mg total) by mouth daily. 90 tablet 3  . Buprenorphine HCl-Naloxone HCl 4-1 MG FILM DISSOLVE 1 FILM UNDER THE TONGUE EVERY DAY FOR 15 DAYS THEN EVERY 12 HOURS FOR 15 DAYS    . busPIRone (BUSPAR) 10 MG tablet Take 1 tablet (10 mg total) by mouth 2 (two) times daily.    . clopidogrel (PLAVIX) 75 MG tablet TAKE ONE TABLET BY MOUTH EVERY MORNING 30 tablet 6  . doxazosin (CARDURA) 4 MG tablet Take 4 mg by mouth every evening.     . DULoxetine 40 MG CPEP Take 40 mg by mouth daily.    Marland Kitchen FLOVENT HFA 44 MCG/ACT inhaler Inhale 3 puffs into the lungs 2 (two) times daily.     . furosemide (LASIX) 20 MG tablet Take 40 mg by mouth every morning.    . gabapentin (NEURONTIN) 300 MG capsule Take 900 mg by mouth 2 (two) times daily.      Marland Kitchen glucagon 1 MG injection Inject 1 mg into the vein once as needed (for low blood sugars.).     . Insulin Glargine (BASAGLAR KWIKPEN) 100 UNIT/ML SOPN Inject 0.25 mLs (25 Units total) into the skin at bedtime. (Patient taking differently: Inject 30 Units into the skin at bedtime. )    . ipratropium (ATROVENT) 0.02 % nebulizer solution Take 0.5 mg by nebulization every 4 (four) hours as needed for wheezing or shortness of breath.     . liraglutide (VICTOZA) 18 MG/3ML SOPN Inject 0.3 mg into the skin daily.    Marland Kitchen lisinopril (ZESTRIL) 10 MG tablet Take 10 mg by mouth every morning.    . Magnesium Oxide 400 (240 Mg) MG TABS Take 400  mg by mouth 2 (two) times daily.  5  . metoprolol succinate (TOPROL-XL) 100 MG 24 hr tablet Take 150 mg by mouth daily.     . Multiple Vitamins-Minerals (CENTRUM SILVER ULTRA MENS PO) Take 1 tablet by mouth daily.  99  . naloxone (NARCAN) nasal spray 4 mg/0.1 mL Place 1 spray into the nose as needed (opioid overdose).     . nitroGLYCERIN (NITROSTAT) 0.4 MG SL tablet Place 0.4 mg under the tongue every 5 (five) minutes as needed for chest pain.     Marland Kitchen NOVOLOG FLEXPEN 100 UNIT/ML FlexPen Inject 0-9 Units into the skin 3 (three) times daily with meals. CBG < 70: implement hypoglycemia protocol CBG 70 - 120: 0 units CBG 121 - 150: 1 unit CBG 151 - 200: 2 units CBG 201 - 250: 3 units CBG 251 - 300: 5 units CBG 301 - 350: 7 units CBG 351 - 400: 9 units CBG > 400: call MD..    . Omega-3 Fatty Acids (FISH OIL) 1000 MG CAPS Take 1,000 mg by mouth at bedtime.    Marland Kitchen oxyCODONE (OXY IR/ROXICODONE) 5 MG immediate release tablet Take 0.5 tablets (2.5 mg total) by mouth every 6 (six) hours as needed for moderate pain or severe pain. 6 tablet 0  . polyethylene glycol (MIRALAX / GLYCOLAX) packet Take 17 g by mouth daily.    . traMADol (ULTRAM) 50 MG tablet Take by mouth.    . warfarin (COUMADIN) 2.5 MG tablet Take 2.5 mg by mouth every evening. 2.5MG  EOD 3MG  EOD  5  . amLODipine  (NORVASC) 10 MG tablet Take 1 tablet (10 mg total) by mouth daily.     No current facility-administered medications for this visit.    Review of Systems  Constitutional:  Constitutional negative. HENT: HENT negative.  Eyes: Eyes negative.  Cardiovascular: Positive for leg swelling.  GI: Gastrointestinal negative.  Musculoskeletal: Musculoskeletal negative.  Skin: Positive for rash.  Neurological: Neurological negative. Hematologic: Hematologic/lymphatic negative.  Psychiatric: Psychiatric negative.        Objective:  Objective   Vitals:   11/01/19 1202  BP: (!) 142/64  Pulse: 64  Resp: 20  Temp: 98.3 F (36.8 C)  SpO2: 96%  Weight: 264 lb (119.7 kg)  Height: 5\' 10"  (1.778 m)   Body mass index is 37.88 kg/m.  Physical Exam HENT:     Nose:     Comments: Wearing a mask Eyes:     Pupils: Pupils are equal, round, and reactive to light.  Cardiovascular:     Rate and Rhythm: Normal rate.     Pulses:          Popliteal pulses are 0 on the right side and 0 on the left side.  Pulmonary:     Effort: Pulmonary effort is normal.  Abdominal:     General: Abdomen is flat.     Palpations: Abdomen is soft. There is no mass.  Musculoskeletal:        General: Swelling present.     Right lower leg: Edema present.     Left lower leg: Edema present.  Skin:    Comments: Edema bilateral lower extremities with skin changes consistent see for venous disease  Neurological:     General: No focal deficit present.     Mental Status: He is alert.  Psychiatric:        Mood and Affect: Mood normal.        Behavior: Behavior normal.  Thought Content: Thought content normal.        Judgment: Judgment normal.     Data: I have independently interpreted his ABIs to be 0.62 monophasic on the right and 0.55 monophasic on the left with toe pressure 71.  Right Stent(s):  +---------------+---+---------------+----------++  Prox to Stent 55050-99% stenosismonophasic   +---------------+---+---------------+----------++  Proximal Stent 53950-99% stenosismonophasic  +---------------+---+---------------+----------++  Mid Stent   67         monophasic  +---------------+---+---------------+----------++  Distal Stent  93         monophasic  +---------------+---+---------------+----------++  Distal to Stent102        monophasic  +---------------+---+---------------+----------++            +----------+--------+-----+--------+----------+--------+  LEFT   PSV cm/sRatioStenosisWaveform Comments  +----------+--------+-----+--------+----------+--------+  SFA Prox 163          monophasic      +----------+--------+-----+--------+----------+--------+  SFA Mid  463          monophasic      +----------+--------+-----+--------+----------+--------+  SFA Distal142          monophasic      +----------+--------+-----+--------+----------+--------+  POP Prox 90          monophasic      +----------+--------+-----+--------+----------+--------+   A focal velocity elevation of 403 cm/s was obtained in the proximal/mid  segment of the SFA with a VR of 2.5.       Summary:  Right: 50-99% stenosis is noted within the proximal stent and native  inflow.   Left: 50-99% stenosis is noted within the proximal/mid superficial femoral  artery.      Assessment/Plan:     79-year-old male follows up for bilateral lower extremity arterial disease.  He also has swelling of his bilateral lower extremities with skin changes that are likely mixed arterial and venous..  I discussed with him the need for angiography due to the in stent stenosis on the right this will possibly need atherectomy with balloon angioplasty.  Left side has a mid SFA stenosis.  We also discussed the need for weight loss to help with his swelling and overall health.  He  continues on Plavix and a statin.  He will need improvement in 72 hours prior to procedure.  We discussed expected outcomes including the need likely compression socks after we improve blood flow.  He demonstrates good understanding we will get him scheduled today.     Paul Chen Christopher Chau Sawin MD [facility] [place]  

## 2019-11-01 NOTE — H&P (View-Only) (Signed)
Patient ID: Paul Chen, male   DOB: 10-16-1940, 79 y.o.   MRN: [id number]  Reason for Consult: Follow-up   Referred by Carmin Richmond, MD  Subjective:     HPI:  Paul Chen is a 79 y.o. male well-known to me from previous bilateral lower extremity interventions.  He had right-sided palpitations which are well-healed.  Chief complaint now is swelling of his bilateral lower extremities.  He has never had venous intervention given the underlying arterial intervention and elected not to pursue this.  He showed up today for evaluation of his bilateral lower extremity legs with bilateral arterial duplexes.  He continues to have significant discomfort in his legs.  He does take Coumadin.  Past Medical History:  Diagnosis Date  . Arthritis   . Chronic renal insufficiency   . CKD (chronic kidney disease), stage III (HCC)   . COPD (chronic obstructive pulmonary disease) (HCC)    with restriction  . Coronary disease October 2010   Status post stenting of the proximal to mid right coronary with DES.  Status post extensive stenting of the proximal right coronary to the crux.  He has moderate disease in  the left coronary system  . Depression   . Diabetes mellitus    Insulin Dependent  . Diastolic dysfunction    Congestive heart failure with  . Dyspnea    multifactorial. chronic, 11/18/9- not an issue at this time  . Gout   . History of blood transfusion   . History of diabetic ulcer of foot    right  . Hypertension   . Hypoventilation    syndrome  . Obesity   . Pneumonia   . Pulmonary embolism [facility]) March 2010   hx of and DVT in March of 2010 on chronic Coumadin therapy  . Pulmonary hypertension (HCC)    Moderate to severe  . Retroperitoneal bleed    Hx of right pelvic  . Sleep apnea    Obstructive , no CPAP  . Wears dentures    Family History  Problem Relation Age of Onset  . Heart attack Mother   . Coronary artery disease Mother   . Hypertension Mother   . Coronary  artery disease Father   . Heart attack Father    Past Surgical History:  Procedure Laterality Date  . ABDOMINAL AORTOGRAM W/LOWER EXTREMITY Right 01/15/2018   Procedure: ABDOMINAL AORTOGRAM W/LOWER EXTREMITY;  Surgeon: Maeola Harman, MD;  Location: [facility] INVASIVE CV LAB;  Service: Cardiovascular;  Laterality: Right;  . AMPUTATION TOE Left 05/04/2017   Procedure: Left hallux amputation;  Surgeon: Toni Arthurs, MD;  Location: [facility];  Service: Orthopedics;  Laterality: Left;  . AMPUTATION TOE Right 12/21/2017   Procedure: Right hallux amputation;  Surgeon: Toni Arthurs, MD;  Location: [facility];  Service: Orthopedics;  Laterality: Right;   . AMPUTATION TOE Right 02/15/2018   Procedure: Right 2nd toe amputation;  Surgeon: Toni Arthurs, MD;  Location: [facility] OR;  Service: Orthopedics;  Laterality: Right;  62  . BACK SURGERY  2000   post  . CARDIOVASCULAR STRESS TEST  11-14-2008   EF 44%  . EXPLORATORY LAPAROTOMY  1972   POST  . LOWER EXTREMITY ANGIOGRAPHY N/A 05/01/2017   Procedure: LOWER EXTREMITY ANGIOGRAPHY;  Surgeon: Maeola Harman, MD;  Location: [facility] INVASIVE CV LAB;  Service: Cardiovascular;  Laterality: N/A;  . LOWER EXTREMITY ANGIOGRAPHY N/A 05/15/2017   Procedure: LOWER EXTREMITY ANGIOGRAPHY;  Surgeon: Maeola Harman,  MD;  Location: [facility] INVASIVE CV LAB;  Service: Cardiovascular;  Laterality: N/A;  . MULTIPLE TOOTH EXTRACTIONS    . PERIPHERAL VASCULAR ATHERECTOMY  05/01/2017   Procedure: PERIPHERAL VASCULAR ATHERECTOMY;  Surgeon: Maeola Harman, MD;  Location: [facility] INVASIVE CV LAB;  Service: Cardiovascular;;  Left SFA  . PERIPHERAL VASCULAR BALLOON ANGIOPLASTY  05/01/2017   Procedure: PERIPHERAL VASCULAR BALLOON ANGIOPLASTY;  Surgeon: Maeola Harman, MD;  Location: [facility] INVASIVE CV LAB;  Service: Cardiovascular;;  Left Anterial Tibial Artery  . PERIPHERAL VASCULAR BALLOON ANGIOPLASTY Right 01/15/2018    Procedure: PERIPHERAL VASCULAR BALLOON ANGIOPLASTY;  Surgeon: Maeola Harman, MD;  Location: [facility] INVASIVE CV LAB;  Service: Cardiovascular;  Laterality: Right;  SFA atherectomy  . PERIPHERAL VASCULAR INTERVENTION Right 05/15/2017   Procedure: PERIPHERAL VASCULAR INTERVENTION;  Surgeon: Maeola Harman, MD;  Location: [facility] INVASIVE CV LAB;  Service: Cardiovascular;  Laterality: Right;  SFA Stent  . US ECHOCARDIOGRAPHY  11-25-2008   EF 55-60%    Short Social History:  Social History   Tobacco Use  . Smoking status: Former Smoker    Years: 2.00    Quit date: 02/01/1980    Years since quitting: 39.7  . Smokeless tobacco: Never Used  Substance Use Topics  . Alcohol use: Not Currently    No Known Allergies  Current Outpatient Medications  Medication Sig Dispense Refill  . acetaminophen (TYLENOL) 500 MG tablet Take 2 tablets (1,000 mg total) by mouth 3 (three) times daily.    Marland Kitchen albuterol (PROVENTIL) (2.5 MG/3ML) 0.083% nebulizer solution Take 2.5 mg by nebulization every 4 (four) hours as needed for wheezing or shortness of breath.     . allopurinol (ZYLOPRIM) 300 MG tablet Take 300 mg by mouth daily.   11  . atorvastatin (LIPITOR) 40 MG tablet Take 1 tablet (40 mg total) by mouth daily. 90 tablet 3  . Buprenorphine HCl-Naloxone HCl 4-1 MG FILM DISSOLVE 1 FILM UNDER THE TONGUE EVERY DAY FOR 15 DAYS THEN EVERY 12 HOURS FOR 15 DAYS    . busPIRone (BUSPAR) 10 MG tablet Take 1 tablet (10 mg total) by mouth 2 (two) times daily.    . clopidogrel (PLAVIX) 75 MG tablet TAKE ONE TABLET BY MOUTH EVERY MORNING 30 tablet 6  . doxazosin (CARDURA) 4 MG tablet Take 4 mg by mouth every evening.     . DULoxetine 40 MG CPEP Take 40 mg by mouth daily.    Marland Kitchen FLOVENT HFA 44 MCG/ACT inhaler Inhale 3 puffs into the lungs 2 (two) times daily.     . furosemide (LASIX) 20 MG tablet Take 40 mg by mouth every morning.    . gabapentin (NEURONTIN) 300 MG capsule Take 900 mg by mouth 2 (two) times daily.      Marland Kitchen glucagon 1 MG injection Inject 1 mg into the vein once as needed (for low blood sugars.).     . Insulin Glargine (BASAGLAR KWIKPEN) 100 UNIT/ML SOPN Inject 0.25 mLs (25 Units total) into the skin at bedtime. (Patient taking differently: Inject 30 Units into the skin at bedtime. )    . ipratropium (ATROVENT) 0.02 % nebulizer solution Take 0.5 mg by nebulization every 4 (four) hours as needed for wheezing or shortness of breath.     . liraglutide (VICTOZA) 18 MG/3ML SOPN Inject 0.3 mg into the skin daily.    Marland Kitchen lisinopril (ZESTRIL) 10 MG tablet Take 10 mg by mouth every morning.    . Magnesium Oxide 400 (240 Mg) MG TABS Take 400  mg by mouth 2 (two) times daily.  5  . metoprolol succinate (TOPROL-XL) 100 MG 24 hr tablet Take 150 mg by mouth daily.     . Multiple Vitamins-Minerals (CENTRUM SILVER ULTRA MENS PO) Take 1 tablet by mouth daily.  99  . naloxone (NARCAN) nasal spray 4 mg/0.1 mL Place 1 spray into the nose as needed (opioid overdose).     . nitroGLYCERIN (NITROSTAT) 0.4 MG SL tablet Place 0.4 mg under the tongue every 5 (five) minutes as needed for chest pain.     Marland Kitchen NOVOLOG FLEXPEN 100 UNIT/ML FlexPen Inject 0-9 Units into the skin 3 (three) times daily with meals. CBG < 70: implement hypoglycemia protocol CBG 70 - 120: 0 units CBG 121 - 150: 1 unit CBG 151 - 200: 2 units CBG 201 - 250: 3 units CBG 251 - 300: 5 units CBG 301 - 350: 7 units CBG 351 - 400: 9 units CBG > 400: call MD..    . Omega-3 Fatty Acids (FISH OIL) 1000 MG CAPS Take 1,000 mg by mouth at bedtime.    Marland Kitchen oxyCODONE (OXY IR/ROXICODONE) 5 MG immediate release tablet Take 0.5 tablets (2.5 mg total) by mouth every 6 (six) hours as needed for moderate pain or severe pain. 6 tablet 0  . polyethylene glycol (MIRALAX / GLYCOLAX) packet Take 17 g by mouth daily.    . traMADol (ULTRAM) 50 MG tablet Take by mouth.    . warfarin (COUMADIN) 2.5 MG tablet Take 2.5 mg by mouth every evening. 2.5MG  EOD 3MG  EOD  5  . amLODipine  (NORVASC) 10 MG tablet Take 1 tablet (10 mg total) by mouth daily.     No current facility-administered medications for this visit.    Review of Systems  Constitutional:  Constitutional negative. HENT: HENT negative.  Eyes: Eyes negative.  Cardiovascular: Positive for leg swelling.  GI: Gastrointestinal negative.  Musculoskeletal: Musculoskeletal negative.  Skin: Positive for rash.  Neurological: Neurological negative. Hematologic: Hematologic/lymphatic negative.  Psychiatric: Psychiatric negative.        Objective:  Objective   Vitals:   11/01/19 1202  BP: (!) 142/64  Pulse: 64  Resp: 20  Temp: 98.3 F (36.8 C)  SpO2: 96%  Weight: 264 lb (119.7 kg)  Height: 5\' 10"  (1.778 m)   Body mass index is 37.88 kg/m.  Physical Exam HENT:     Nose:     Comments: Wearing a mask Eyes:     Pupils: Pupils are equal, round, and reactive to light.  Cardiovascular:     Rate and Rhythm: Normal rate.     Pulses:          Popliteal pulses are 0 on the right side and 0 on the left side.  Pulmonary:     Effort: Pulmonary effort is normal.  Abdominal:     General: Abdomen is flat.     Palpations: Abdomen is soft. There is no mass.  Musculoskeletal:        General: Swelling present.     Right lower leg: Edema present.     Left lower leg: Edema present.  Skin:    Comments: Edema bilateral lower extremities with skin changes consistent see for venous disease  Neurological:     General: No focal deficit present.     Mental Status: He is alert.  Psychiatric:        Mood and Affect: Mood normal.        Behavior: Behavior normal.  Thought Content: Thought content normal.        Judgment: Judgment normal.     Data: I have independently interpreted his ABIs to be 0.62 monophasic on the right and 0.55 monophasic on the left with toe pressure 71.  Right Stent(s):  +---------------+---+---------------+----------++  Prox to Stent 55050-99% stenosismonophasic   +---------------+---+---------------+----------++  Proximal Stent 53950-99% stenosismonophasic  +---------------+---+---------------+----------++  Mid Stent   67         monophasic  +---------------+---+---------------+----------++  Distal Stent  93         monophasic  +---------------+---+---------------+----------++  Distal to Stent102        monophasic  +---------------+---+---------------+----------++            +----------+--------+-----+--------+----------+--------+  LEFT   PSV cm/sRatioStenosisWaveform Comments  +----------+--------+-----+--------+----------+--------+  SFA Prox 163          monophasic      +----------+--------+-----+--------+----------+--------+  SFA Mid  463          monophasic      +----------+--------+-----+--------+----------+--------+  SFA Distal142          monophasic      +----------+--------+-----+--------+----------+--------+  POP Prox 90          monophasic      +----------+--------+-----+--------+----------+--------+   A focal velocity elevation of 403 cm/s was obtained in the proximal/mid  segment of the SFA with a VR of 2.5.       Summary:  Right: 50-99% stenosis is noted within the proximal stent and native  inflow.   Left: 50-99% stenosis is noted within the proximal/mid superficial femoral  artery.      Assessment/Plan:     79 year old male follows up for bilateral lower extremity arterial disease.  He also has swelling of his bilateral lower extremities with skin changes that are likely mixed arterial and venous..  I discussed with him the need for angiography due to the in stent stenosis on the right this will possibly need atherectomy with balloon angioplasty.  Left side has a mid SFA stenosis.  We also discussed the need for weight loss to help with his swelling and overall health.  He  continues on Plavix and a statin.  He will need improvement in 72 hours prior to procedure.  We discussed expected outcomes including the need likely compression socks after we improve blood flow.  He demonstrates good understanding we will get him scheduled today.     Maeola Harman MD [facility]

## 2019-11-05 ENCOUNTER — Encounter: Payer: Self-pay | Admitting: *Deleted

## 2019-11-05 ENCOUNTER — Other Ambulatory Visit: Payer: Self-pay | Admitting: *Deleted

## 2019-11-11 ENCOUNTER — Other Ambulatory Visit: Payer: Self-pay | Admitting: Cardiology

## 2019-11-16 ENCOUNTER — Other Ambulatory Visit (HOSPITAL_COMMUNITY)
Admission: RE | Admit: 2019-11-16 | Discharge: 2019-11-16 | Disposition: A | Discharge status: Home / Self Care | Payer: Medicare Other | Source: Ambulatory Visit | Attending: Vascular Surgery | Admitting: Vascular Surgery

## 2019-11-16 DIAGNOSIS — Z01812 Encounter for preprocedural laboratory examination: Secondary | ICD-10-CM | POA: Insufficient documentation

## 2019-11-16 DIAGNOSIS — Z20822 Contact with and (suspected) exposure to covid-19: Secondary | ICD-10-CM | POA: Insufficient documentation

## 2019-11-16 LAB — SARS CORONAVIRUS 2 (TAT 6-24 HRS): SARS Coronavirus 2: NEGATIVE

## 2019-11-18 ENCOUNTER — Encounter (HOSPITAL_COMMUNITY): Payer: Self-pay | Admitting: Vascular Surgery

## 2019-11-18 ENCOUNTER — Encounter (HOSPITAL_COMMUNITY)
Admission: RE | Disposition: A | Discharge status: Home / Self Care | Payer: Self-pay | Source: Home / Self Care | Attending: Vascular Surgery

## 2019-11-18 ENCOUNTER — Ambulatory Visit (HOSPITAL_COMMUNITY)
Admission: RE | Admit: 2019-11-18 | Discharge: 2019-11-18 | Disposition: A | Discharge status: Home / Self Care | Payer: Medicare Other | Attending: Vascular Surgery | Admitting: Vascular Surgery

## 2019-11-18 ENCOUNTER — Other Ambulatory Visit: Payer: Self-pay

## 2019-11-18 DIAGNOSIS — I130 Hypertensive heart and chronic kidney disease with heart failure and stage 1 through stage 4 chronic kidney disease, or unspecified chronic kidney disease: Secondary | ICD-10-CM | POA: Diagnosis not present

## 2019-11-18 DIAGNOSIS — Z7902 Long term (current) use of antithrombotics/antiplatelets: Secondary | ICD-10-CM | POA: Insufficient documentation

## 2019-11-18 DIAGNOSIS — I2510 Atherosclerotic heart disease of native coronary artery without angina pectoris: Secondary | ICD-10-CM | POA: Diagnosis not present

## 2019-11-18 DIAGNOSIS — J449 Chronic obstructive pulmonary disease, unspecified: Secondary | ICD-10-CM | POA: Diagnosis not present

## 2019-11-18 DIAGNOSIS — Z794 Long term (current) use of insulin: Secondary | ICD-10-CM | POA: Diagnosis not present

## 2019-11-18 DIAGNOSIS — M109 Gout, unspecified: Secondary | ICD-10-CM | POA: Diagnosis not present

## 2019-11-18 DIAGNOSIS — R609 Edema, unspecified: Secondary | ICD-10-CM | POA: Diagnosis not present

## 2019-11-18 DIAGNOSIS — N183 Chronic kidney disease, stage 3 unspecified: Secondary | ICD-10-CM | POA: Insufficient documentation

## 2019-11-18 DIAGNOSIS — Z955 Presence of coronary angioplasty implant and graft: Secondary | ICD-10-CM | POA: Insufficient documentation

## 2019-11-18 DIAGNOSIS — Z79899 Other long term (current) drug therapy: Secondary | ICD-10-CM | POA: Diagnosis not present

## 2019-11-18 DIAGNOSIS — F329 Major depressive disorder, single episode, unspecified: Secondary | ICD-10-CM | POA: Insufficient documentation

## 2019-11-18 DIAGNOSIS — E1122 Type 2 diabetes mellitus with diabetic chronic kidney disease: Secondary | ICD-10-CM | POA: Diagnosis not present

## 2019-11-18 DIAGNOSIS — Z86718 Personal history of other venous thrombosis and embolism: Secondary | ICD-10-CM | POA: Diagnosis not present

## 2019-11-18 DIAGNOSIS — I509 Heart failure, unspecified: Secondary | ICD-10-CM | POA: Diagnosis not present

## 2019-11-18 DIAGNOSIS — I70211 Atherosclerosis of native arteries of extremities with intermittent claudication, right leg: Secondary | ICD-10-CM | POA: Diagnosis not present

## 2019-11-18 DIAGNOSIS — T82858A Stenosis of vascular prosthetic devices, implants and grafts, initial encounter: Secondary | ICD-10-CM | POA: Diagnosis present

## 2019-11-18 DIAGNOSIS — Z87891 Personal history of nicotine dependence: Secondary | ICD-10-CM | POA: Diagnosis not present

## 2019-11-18 DIAGNOSIS — E1151 Type 2 diabetes mellitus with diabetic peripheral angiopathy without gangrene: Secondary | ICD-10-CM | POA: Insufficient documentation

## 2019-11-18 DIAGNOSIS — G4733 Obstructive sleep apnea (adult) (pediatric): Secondary | ICD-10-CM | POA: Diagnosis not present

## 2019-11-18 DIAGNOSIS — T82856A Stenosis of peripheral vascular stent, initial encounter: Secondary | ICD-10-CM | POA: Diagnosis not present

## 2019-11-18 DIAGNOSIS — Y832 Surgical operation with anastomosis, bypass or graft as the cause of abnormal reaction of the patient, or of later complication, without mention of misadventure at the time of the procedure: Secondary | ICD-10-CM | POA: Insufficient documentation

## 2019-11-18 HISTORY — PX: PERIPHERAL VASCULAR ATHERECTOMY: CATH118256

## 2019-11-18 HISTORY — PX: ABDOMINAL AORTOGRAM W/LOWER EXTREMITY: CATH118223

## 2019-11-18 HISTORY — PX: PERIPHERAL VASCULAR BALLOON ANGIOPLASTY: CATH118281

## 2019-11-18 LAB — POCT I-STAT, CHEM 8
BUN: 25 mg/dL — ABNORMAL HIGH (ref 8–23)
Calcium, Ion: 1.02 mmol/L — ABNORMAL LOW (ref 1.15–1.40)
Chloride: 98 mmol/L (ref 98–111)
Creatinine, Ser: 1.8 mg/dL — ABNORMAL HIGH (ref 0.61–1.24)
Glucose, Bld: 188 mg/dL — ABNORMAL HIGH (ref 70–99)
HCT: 42 % (ref 39.0–52.0)
Hemoglobin: 14.3 g/dL (ref 13.0–17.0)
Potassium: 4.5 mmol/L (ref 3.5–5.1)
Sodium: 138 mmol/L (ref 135–145)
TCO2: 32 mmol/L (ref 22–32)

## 2019-11-18 LAB — GLUCOSE, CAPILLARY
Glucose-Capillary: 221 mg/dL — ABNORMAL HIGH (ref 70–99)
Glucose-Capillary: 163 mg/dL — ABNORMAL HIGH (ref 70–99)

## 2019-11-18 LAB — PROTIME-INR
INR: 1.1 (ref 0.8–1.2)
Prothrombin Time: 13.7 seconds (ref 11.4–15.2)

## 2019-11-18 SURGERY — ABDOMINAL AORTOGRAM W/LOWER EXTREMITY
Anesthesia: LOCAL | Laterality: Right

## 2019-11-18 MED ORDER — CLOPIDOGREL BISULFATE 300 MG PO TABS
ORAL_TABLET | ORAL | Status: AC
  Filled 2019-11-18: qty 1

## 2019-11-18 MED ORDER — FENTANYL CITRATE (PF) 100 MCG/2ML IJ SOLN
INTRAMUSCULAR | Status: AC
  Filled 2019-11-18: qty 2

## 2019-11-18 MED ORDER — HEPARIN (PORCINE) IN NACL 2000-0.9 UNIT/L-% IV SOLN
INTRAVENOUS | Status: AC
  Filled 2019-11-18: qty 1000

## 2019-11-18 MED ORDER — SODIUM CHLORIDE 0.9 % IV SOLN: INTRAVENOUS | Status: DC

## 2019-11-18 MED ORDER — HEPARIN (PORCINE) IN NACL 1000-0.9 UT/500ML-% IV SOLN
INTRAVENOUS | Status: DC | PRN
  Administered 2019-11-18 (×2): 500 mL

## 2019-11-18 MED ORDER — FENTANYL CITRATE (PF) 100 MCG/2ML IJ SOLN
INTRAMUSCULAR | Status: DC | PRN
Start: 2019-11-18 — End: 2019-11-18
  Administered 2019-11-18 (×3): 50 ug via INTRAVENOUS

## 2019-11-18 MED ORDER — HEPARIN SODIUM (PORCINE) 1000 UNIT/ML IJ SOLN
INTRAMUSCULAR | Status: AC
  Filled 2019-11-18: qty 1

## 2019-11-18 MED ORDER — HEPARIN SODIUM (PORCINE) 1000 UNIT/ML IJ SOLN
INTRAMUSCULAR | Status: DC | PRN
  Administered 2019-11-18: 12000 [IU] via INTRAVENOUS

## 2019-11-18 MED ORDER — CLOPIDOGREL BISULFATE 75 MG PO TABS: 75.0000 mg | ORAL_TABLET | Freq: Every day | ORAL | Status: DC

## 2019-11-18 MED ORDER — SODIUM CHLORIDE 0.9% FLUSH: 3.0000 mL | INTRAVENOUS | Status: DC | PRN

## 2019-11-18 MED ORDER — CLOPIDOGREL BISULFATE 300 MG PO TABS
ORAL_TABLET | ORAL | Status: DC | PRN
  Administered 2019-11-18: 300 mg via ORAL

## 2019-11-18 MED ORDER — HYDRALAZINE HCL 20 MG/ML IJ SOLN: 5.0000 mg | INTRAMUSCULAR | Status: DC | PRN

## 2019-11-18 MED ORDER — MORPHINE SULFATE (PF) 2 MG/ML IV SOLN: 2.0000 mg | INTRAVENOUS | Status: DC | PRN

## 2019-11-18 MED ORDER — OXYCODONE HCL 5 MG PO TABS: 5.0000 mg | ORAL_TABLET | ORAL | Status: DC | PRN

## 2019-11-18 MED ORDER — IODIXANOL 320 MG/ML IV SOLN
INTRAVENOUS | Status: DC | PRN
  Administered 2019-11-18: 110 mL

## 2019-11-18 MED ORDER — ONDANSETRON HCL 4 MG/2ML IJ SOLN: 4.0000 mg | Freq: Four times a day (QID) | INTRAMUSCULAR | Status: DC | PRN

## 2019-11-18 MED ORDER — ACETAMINOPHEN 325 MG PO TABS: 650.0000 mg | ORAL_TABLET | ORAL | Status: DC | PRN

## 2019-11-18 MED ORDER — SODIUM CHLORIDE 0.9% FLUSH: 3.0000 mL | Freq: Two times a day (BID) | INTRAVENOUS | Status: DC

## 2019-11-18 MED ORDER — LIDOCAINE HCL (PF) 1 % IJ SOLN
INTRAMUSCULAR | Status: AC
  Filled 2019-11-18: qty 30

## 2019-11-18 MED ORDER — HEPARIN (PORCINE) IN NACL 1000-0.9 UT/500ML-% IV SOLN
INTRAVENOUS | Status: AC
  Filled 2019-11-18: qty 500

## 2019-11-18 MED ORDER — MIDAZOLAM HCL 2 MG/2ML IJ SOLN
INTRAMUSCULAR | Status: DC | PRN
  Administered 2019-11-18: 1 mg via INTRAVENOUS

## 2019-11-18 MED ORDER — MIDAZOLAM HCL 2 MG/2ML IJ SOLN
INTRAMUSCULAR | Status: AC
  Filled 2019-11-18: qty 2

## 2019-11-18 MED ORDER — SODIUM CHLORIDE 0.9 % IV SOLN: 250.0000 mL | INTRAVENOUS | Status: DC | PRN

## 2019-11-18 MED ORDER — LIDOCAINE HCL (PF) 1 % IJ SOLN
INTRAMUSCULAR | Status: DC | PRN
  Administered 2019-11-18: 20 mL

## 2019-11-18 MED ORDER — LABETALOL HCL 5 MG/ML IV SOLN: 10.0000 mg | INTRAVENOUS | Status: DC | PRN

## 2019-11-18 SURGICAL SUPPLY — 24 items
BAG SNAP BAND KOVER 36X36 (MISCELLANEOUS) ×1 IMPLANT
BALLN STERLING OTW 6X40X135 (BALLOONS) ×2
BALLOON STERLING OTW 6X40X135 (BALLOONS) IMPLANT
CATH ANGIO 5F BER2 65CM (CATHETERS) ×1 IMPLANT
CATH AURYON 6FR ATHEREC 2.0 (CATHETERS) ×1 IMPLANT
CATH CROSS OVER TEMPO 5F (CATHETERS) ×1 IMPLANT
CATH OMNI FLUSH 5F 65CM (CATHETERS) ×1 IMPLANT
CLOSURE MYNX CONTROL 6F/7F (Vascular Products) ×1 IMPLANT
COVER DOME SNAP 22 D (MISCELLANEOUS) ×1 IMPLANT
DCB RANGER 6.0X40 135 (BALLOONS) IMPLANT
GLIDEWIRE ADV .035X260CM (WIRE) ×1 IMPLANT
KIT ENCORE 26 ADVANTAGE (KITS) ×1 IMPLANT
KIT MICROPUNCTURE NIT STIFF (SHEATH) ×1 IMPLANT
KIT PV (KITS) ×2 IMPLANT
RANGER DCB 6.0X40 135 (BALLOONS) ×2
SHEATH PINNACLE 5F 10CM (SHEATH) ×1 IMPLANT
SHEATH PINNACLE 6F 10CM (SHEATH) ×1 IMPLANT
SHEATH PINNACLE ST 6F 45CM (SHEATH) ×1 IMPLANT
SHEATH PROBE COVER 6X72 (BAG) ×1 IMPLANT
STOPCOCK MORSE 400PSI 3WAY (MISCELLANEOUS) ×1 IMPLANT
SYR MEDRAD MARK V 150ML (SYRINGE) ×1 IMPLANT
TRANSDUCER W/STOPCOCK (MISCELLANEOUS) ×2 IMPLANT
TRAY PV CATH (CUSTOM PROCEDURE TRAY) ×2 IMPLANT
WIRE BENTSON .035X145CM (WIRE) ×1 IMPLANT

## 2019-11-18 NOTE — Interval H&P Note (Signed)
History and Physical Interval Note:  11/18/2019 8:22 AM  Paul Chen  has presented today for surgery, with the diagnosis of pvd.  The various methods of treatment have been discussed with the patient and family. After consideration of risks, benefits and other options for treatment, the patient has consented to  Procedure(s): ABDOMINAL AORTOGRAM W/LOWER EXTREMITY (N/A) as a surgical intervention.  The patient's history has been reviewed, patient examined, no change in status, stable for surgery.  I have reviewed the patient's chart and labs.  Questions were answered to the patient's satisfaction.     [name]

## 2019-11-18 NOTE — Interval H&P Note (Signed)
History and Physical Interval Note:  11/18/2019 8:26 AM  Paul Chen  has presented today for surgery, with the diagnosis of pvd.  The various methods of treatment have been discussed with the patient and family. After consideration of risks, benefits and other options for treatment, the patient has consented to  Procedure(s): ABDOMINAL AORTOGRAM W/LOWER EXTREMITY (N/A) as a surgical intervention.  The patient's history has been reviewed, patient examined, no change in status, stable for surgery.  I have reviewed the patient's chart and labs.  Questions were answered to the patient's satisfaction.     [name]

## 2019-11-18 NOTE — Discharge Instructions (Signed)
 DRINK PLENTY OF FLUIDS OVER THE NEXT 2-3 DAYS.Femoral Site Care This sheet gives you information about how to care for yourself after your procedure. Your health care provider may also give you more specific instructions. If you have problems or questions, contact your health care provider. What can I expect after the procedure? After the procedure, it is common to have:  Bruising that usually fades within 1-2 weeks.  Tenderness at the site. Follow these instructions at home: Wound care  Follow instructions from your health care provider about how to take care of your insertion site. Make sure you: ? Wash your hands with soap and water before you change your bandage (dressing). If soap and water are not available, use hand sanitizer. ? Change your dressing as told by your health care provider. ? Leave stitches (sutures), skin glue, or adhesive strips in place. These skin closures may need to stay in place for 2 weeks or longer. If adhesive strip edges start to loosen and curl up, you may trim the loose edges. Do not remove adhesive strips completely unless your health care provider tells you to do that.  Do not take baths, swim, or use a hot tub until your health care provider approves.  You may shower 24-48 hours after the procedure or as told by your health care provider. ? Gently wash the site with plain soap and water. ? Pat the area dry with a clean towel. ? Do not rub the site. This may cause bleeding.  Do not apply powder or lotion to the site. Keep the site clean and dry.  Check your femoral site every day for signs of infection. Check for: ? Redness, swelling, or pain. ? Fluid or blood. ? Warmth. ? Pus or a bad smell. Activity  For the first 2-3 days after your procedure, or as long as directed: ? Avoid climbing stairs as much as possible. ? Do not squat.  Do not lift anything that is heavier than 10 lb (4.5 kg), or the limit that you are told, until your health care  provider says that it is safe.  Rest as directed. ? Avoid sitting for a long time without moving. Get up to take short walks every 1-2 hours.  Do not drive for 24 hours if you were given a medicine to help you relax (sedative). General instructions  Take over-the-counter and prescription medicines only as told by your health care provider.  Keep all follow-up visits as told by your health care provider. This is important. Contact a health care provider if you have:  A fever or chills.  You have redness, swelling, or pain around your insertion site. Get help right away if:  The catheter insertion area swells very fast.  You pass out.  You suddenly start to sweat or your skin gets clammy.  The catheter insertion area is bleeding, and the bleeding does not stop when you hold steady pressure on the area.  The area near or just beyond the catheter insertion site becomes pale, cool, tingly, or numb. These symptoms may represent a serious problem that is an emergency. Do not wait to see if the symptoms will go away. Get medical help right away. Call your local emergency services (911 in the [place] Do not drive yourself to the hospital. Summary  After the procedure, it is common to have bruising that usually fades within 1-2 weeks.  Check your femoral site every day for signs of infection.  Do not lift anything that   is heavier than 10 lb (4.5 kg), or the limit that you are told, until your health care provider says that it is safe. This information is not intended to replace advice given to you by your health care provider. Make sure you discuss any questions you have with your health care provider. Document Revised: 01/30/2017 Document Reviewed: 01/30/2017 Elsevier Patient Education  2020 [employer] Inc[employer]  

## 2019-11-18 NOTE — Op Note (Signed)
    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 Randie Heinz, MD Procedure Performed: 1.  Ultrasound-guided cannulation left common femoral artery 2.  Aortogram and right lower extremity angiography 3.  Laser arthrectomy with 2.0 [place] right SFA 4.  Drug-coated balloon angioplasty right SFA for in-stent stenosis with 6 x 40 mm Ranger 5.  Moderate sedation with fentanyl and Versed for 56 minutes 6.  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  He had stenting of his SFA.  He now has stenosis at the proximal stent into the SFA is indicated for angiography possible invention.  There is also stenosis of the left SFA given his renal function limitations this was not studied today.  Findings: Aorta demonstrates approximately 50% stenosis of the left renal artery.  He has significant hardware in his back which does obstruct some of the view but there is no flow limitation.  Right SFA proximally has approximately 80% stenosis prior to the stent which was visualized with LAO orientation.  After intervention with laser arthrectomy and drug-coated balloon angioplasty there is 0% residual stenosis.  He has three-vessel runoff to the foot on the right.  There is approximately 30% stenosis of the distal stents near the abductor canal which was not treated.   Procedure:  The patient was identified in the holding area and taken to room 8.  The patient was then placed supine on the table and prepped and draped in the usual sterile fashion.  A time out was called.  Ultrasound was used to evaluate the left common femoral artery.  This had some posterior disease but otherwise was patent.  The area was anesthetized 1% lidocaine  cannulated with micropuncture needle followed by wire sheath.  And images saved the permanent record.  Bentson wires placed followed 5 French sheath.  Omni catheter was placed to the level of L1 and aortogram performed.  We crossed the bifurcation with Glidewire advantage and crossover catheter.  We then performed right lower extremity angiography with the above findings.  We then exchanged for a long 6 French sheath patient was fully heparinized.  We crossed the stenosis confirmed intraluminal access using very catheter and Glidewire advantage wire.  We then exchanged for a [place] core wire.  We performed laser arthrectomy.  We then performed balloon angioplasty completion angiography demonstrated no residual stenosis.  We performed drug-coated balloon angioplasty again no residual stenosis.  Satisfied we exchanged for short 6 [place] sheath deployed a minx device.  He tolerated procedure without any complication.  Contrast: 110cc  Paul Boyum C. Randie Heinz, MD [facility] Office: [phone] Pager: [phone]

## 2019-11-20 ENCOUNTER — Telehealth: Payer: Self-pay

## 2019-11-20 NOTE — Telephone Encounter (Signed)
Patient had PV procedure on Monday with Dr. Randie Heinz. Called to ask how long he had to wear the bandaid. Told him it had been over 24 hours and he could remove it. Instructed him to keep it clean and dry. Patient verbalized understanding.

## 2019-12-20 ENCOUNTER — Other Ambulatory Visit: Payer: Self-pay | Admitting: Cardiology

## 2019-12-23 ENCOUNTER — Other Ambulatory Visit: Payer: Self-pay | Admitting: *Deleted

## 2019-12-23 DIAGNOSIS — I872 Venous insufficiency (chronic) (peripheral): Secondary | ICD-10-CM

## 2019-12-23 DIAGNOSIS — I7025 Atherosclerosis of native arteries of other extremities with ulceration: Secondary | ICD-10-CM

## 2020-01-03 ENCOUNTER — Encounter (HOSPITAL_COMMUNITY): Payer: Medicare Other

## 2020-01-03 ENCOUNTER — Encounter: Payer: Medicare Other | Admitting: Vascular Surgery

## 2020-01-03 ENCOUNTER — Inpatient Hospital Stay (HOSPITAL_COMMUNITY): Admission: RE | Admit: 2020-01-03 | Payer: Medicare Other | Source: Ambulatory Visit

## 2020-06-02 ENCOUNTER — Other Ambulatory Visit: Payer: Self-pay

## 2020-06-02 DIAGNOSIS — I7025 Atherosclerosis of native arteries of other extremities with ulceration: Secondary | ICD-10-CM

## 2020-06-12 ENCOUNTER — Encounter (HOSPITAL_COMMUNITY): Payer: Medicare Other

## 2020-06-12 ENCOUNTER — Ambulatory Visit: Payer: Medicare Other | Admitting: Vascular Surgery

## 2020-06-16 ENCOUNTER — Ambulatory Visit (INDEPENDENT_AMBULATORY_CARE_PROVIDER_SITE_OTHER): Payer: Medicare Other | Admitting: Vascular Surgery

## 2020-06-16 ENCOUNTER — Ambulatory Visit (INDEPENDENT_AMBULATORY_CARE_PROVIDER_SITE_OTHER)
Admission: RE | Admit: 2020-06-16 | Discharge: 2020-06-16 | Disposition: A | Discharge status: Home / Self Care | Payer: Medicare Other | Source: Ambulatory Visit | Attending: Vascular Surgery | Admitting: Vascular Surgery

## 2020-06-16 ENCOUNTER — Encounter: Payer: Self-pay | Admitting: Vascular Surgery

## 2020-06-16 ENCOUNTER — Ambulatory Visit (HOSPITAL_COMMUNITY)
Admission: RE | Admit: 2020-06-16 | Discharge: 2020-06-16 | Disposition: A | Discharge status: Home / Self Care | Payer: Medicare Other | Source: Ambulatory Visit | Attending: Vascular Surgery | Admitting: Vascular Surgery

## 2020-06-16 ENCOUNTER — Other Ambulatory Visit: Payer: Self-pay

## 2020-06-16 VITALS — BP 139/60 | HR 62 | Temp 98.4°F | Resp 20 | Ht 69.0 in | Wt 265.0 lb

## 2020-06-16 DIAGNOSIS — I7025 Atherosclerosis of native arteries of other extremities with ulceration: Secondary | ICD-10-CM

## 2020-06-16 DIAGNOSIS — I872 Venous insufficiency (chronic) (peripheral): Secondary | ICD-10-CM | POA: Diagnosis not present

## 2020-06-16 NOTE — Progress Notes (Signed)
Patient ID: Paul Chen, male   DOB: Jul 21, 1940, 80 y.o.   MRN: [id number]  Reason for Consult: No chief complaint on file.   Referred by Lindwood Qua, MD  Subjective:     HPI:  Paul Chen is a 80 y.o. male has history of previous bilateral lower extremity interventions.  He now follows up with noninvasive studies.  He does have significant pain in his bilateral lower extremities.  Most recently he underwent intervention of his right SFA with stent extension proximally.  He has persistent pain in his bilateral lower extremities associated with neuropathy and swelling.  He does not wear compression socks given that is difficult to get them on and off.  He does say that legs feel better when the swelling is down and they are much less edematous in the mornings.  He has no new tissue loss or ulceration.  Past Medical History:  Diagnosis Date  . Arthritis   . Chronic renal insufficiency   . CKD (chronic kidney disease), stage III (HCC)   . COPD (chronic obstructive pulmonary disease) (HCC)    with restriction  . Coronary disease October 2010   Status post stenting of the proximal to mid right coronary with DES.  Status post extensive stenting of the proximal right coronary to the crux.  He has moderate disease in  the left coronary system  . Depression   . Diabetes mellitus    Insulin Dependent  . Diastolic dysfunction    Congestive heart failure with  . Dyspnea    multifactorial. chronic, 11/18/9- not an issue at this time  . Gout   . History of blood transfusion   . History of diabetic ulcer of foot    right  . Hypertension   . Hypoventilation    syndrome  . Obesity   . Pneumonia   . Pulmonary embolism [facility]) March 2010   hx of and DVT in March of 2010 on chronic Coumadin therapy  . Pulmonary hypertension (HCC)    Moderate to severe  . Retroperitoneal bleed    Hx of right pelvic  . Sleep apnea    Obstructive , no CPAP  . Wears dentures    Family History   Problem Relation Age of Onset  . Heart attack Mother   . Coronary artery disease Mother   . Hypertension Mother   . Coronary artery disease Father   . Heart attack Father    Past Surgical History:  Procedure Laterality Date  . ABDOMINAL AORTOGRAM W/LOWER EXTREMITY Right 01/15/2018   Procedure: ABDOMINAL AORTOGRAM W/LOWER EXTREMITY;  Surgeon: Maeola Harman, MD;  Location: [facility] INVASIVE CV LAB;  Service: Cardiovascular;  Laterality: Right;  . ABDOMINAL AORTOGRAM W/LOWER EXTREMITY Right 11/18/2019   Procedure: ABDOMINAL AORTOGRAM W/LOWER EXTREMITY;  Surgeon: Maeola Harman, MD;  Location: [facility] INVASIVE CV LAB;  Service: Cardiovascular;  Laterality: Right;  . AMPUTATION TOE Left 05/04/2017   Procedure: Left hallux amputation;  Surgeon: Toni Arthurs, MD;  Location: [facility];  Service: Orthopedics;  Laterality: Left;  . AMPUTATION TOE Right 12/21/2017   Procedure: Right hallux amputation;  Surgeon: Toni Arthurs, MD;  Location: [facility];  Service: Orthopedics;  Laterality: Right;   . AMPUTATION TOE Right 02/15/2018   Procedure: Right 2nd toe amputation;  Surgeon: Toni Arthurs, MD;  Location: [facility] OR;  Service: Orthopedics;  Laterality: Right;  62  . BACK SURGERY  2000   post  . CARDIOVASCULAR STRESS TEST  11-14-2008  EF 44%  . EXPLORATORY LAPAROTOMY  1972   POST  . LOWER EXTREMITY ANGIOGRAPHY N/A 05/01/2017   Procedure: LOWER EXTREMITY ANGIOGRAPHY;  Surgeon: Maeola Harmanain, Elener Custodio Christopher, MD;  [facility];  Service: Cardiovascular;  Laterality: N/A;  . LOWER EXTREMITY ANGIOGRAPHY N/A 05/15/2017   Procedure: LOWER EXTREMITY ANGIOGRAPHY;  Surgeon: Maeola Harmanain, Venecia Mehl Christopher, MD;  [facility];  Service: Cardiovascular;  Laterality: N/A;  . MULTIPLE TOOTH EXTRACTIONS    . PERIPHERAL VASCULAR ATHERECTOMY  05/01/2017   Procedure: PERIPHERAL VASCULAR ATHERECTOMY;  Surgeon: Maeola Harmanain, Isamar Wellbrock Christopher, MD;  [facility];  Service: Cardiovascular;;  Left SFA  . PERIPHERAL VASCULAR ATHERECTOMY Right 11/18/2019   Procedure: PERIPHERAL VASCULAR ATHERECTOMY;  Surgeon: Maeola Harmanain, Quinlee Sciarra Christopher, MD;  [facility];  Service: Cardiovascular;  Laterality: Right;  SFA  . PERIPHERAL VASCULAR BALLOON ANGIOPLASTY  05/01/2017   Procedure: PERIPHERAL VASCULAR BALLOON ANGIOPLASTY;  Surgeon: Maeola Harmanain, Valor Quaintance Christopher, MD;  [facility];  Service: Cardiovascular;;  Left Anterial Tibial Artery  . PERIPHERAL VASCULAR BALLOON ANGIOPLASTY Right 01/15/2018   Procedure: PERIPHERAL VASCULAR BALLOON ANGIOPLASTY;  Surgeon: Maeola Harmanain, Derek Huneycutt Christopher, MD;  [facility];  Service: Cardiovascular;  Laterality: Right;  SFA atherectomy  . PERIPHERAL VASCULAR BALLOON ANGIOPLASTY Right 11/18/2019   Procedure: PERIPHERAL VASCULAR BALLOON ANGIOPLASTY;  Surgeon: Maeola Harmanain, Sulayman Manning Christopher, MD;  [facility];  Service: Cardiovascular;  Laterality: Right;  SFA  . PERIPHERAL VASCULAR INTERVENTION Right 05/15/2017   Procedure: PERIPHERAL VASCULAR INTERVENTION;  Surgeon: Maeola Harmanain, Jannette Cotham Christopher, MD;  [facility];  Service: Cardiovascular;  Laterality: Right;  SFA Stent  . US ECHOCARDIOGRAPHY  11-25-2008   EF 55-60%    Short Social History:  Social History   Tobacco Use  . Smoking status: Former Smoker    Years: 2.00    Quit date: 02/01/1980    Years since quitting: 40.4  . Smokeless tobacco: Never Used  Substance Use Topics  . Alcohol use: Not Currently    No Known Allergies  Current Outpatient Medications  Medication Sig Dispense Refill  . acetaminophen (TYLENOL) 500 MG tablet Take 2 tablets (1,000 mg total) by mouth 3 (three) times daily.    Marland Kitchen. albuterol (PROVENTIL) (2.5 MG/3ML) 0.083% nebulizer solution Take 2.5 mg by nebulization every 4 (four) hours as needed for wheezing or shortness of breath.     . allopurinol (ZYLOPRIM) 300 MG tablet Take  300 mg by mouth daily.   11  . amLODipine (NORVASC) 5 MG tablet Take 5 mg by mouth daily.    Marland Kitchen. atorvastatin (LIPITOR) 40 MG tablet Take 1 tablet (40 mg total) by mouth daily. 90 tablet 3  . busPIRone (BUSPAR) 15 MG tablet Take 15 mg by mouth 2 (two) times daily.    . clopidogrel (PLAVIX) 75 MG tablet TAKE ONE TABLET BY MOUTH EVERY MORNING 30 tablet 6  . doxazosin (CARDURA) 4 MG tablet Take 4 mg by mouth every evening.     . DULoxetine (CYMBALTA) 30 MG capsule Take 30 mg by mouth daily.    . DULoxetine (CYMBALTA) 60 MG capsule Take 60 mg by mouth daily.    . furosemide (LASIX) 20 MG tablet Take 20-40 mg by mouth in the morning and at bedtime.     . gabapentin (NEURONTIN) 300 MG capsule Take 900 mg by mouth 3 (three) times daily.     Marland Kitchen. glucagon 1 MG injection Inject 1 mg into the vein once as  needed (for low blood sugars.).     Marland Kitchen insulin glargine (LANTUS) 100 unit/mL SOPN Inject 40 Units into the skin at bedtime.    . insulin lispro (HUMALOG) 100 UNIT/ML KwikPen Inject 10 Units into the skin in the morning, at noon, and at bedtime.    Marland Kitchen ipratropium (ATROVENT) 0.02 % nebulizer solution Take 0.5 mg by nebulization every 4 (four) hours as needed for wheezing or shortness of breath.     . lisinopril (ZESTRIL) 10 MG tablet Take 10 mg by mouth daily.     . magnesium oxide (MAG-OX) 400 MG tablet Take 400 mg by mouth 2 (two) times daily.    . metoprolol succinate (TOPROL-XL) 100 MG 24 hr tablet Take 150 mg by mouth daily.     . mirtazapine (REMERON) 7.5 MG tablet Take 7.5 mg by mouth at bedtime.    . Multiple Vitamin (MULTIVITAMIN WITH MINERALS) TABS tablet Take 1 tablet by mouth daily. ABC Complete Senior Multivitamin for 50+    . nitroGLYCERIN (NITROSTAT) 0.4 MG SL tablet Place 0.4 mg under the tongue every 5 (five) minutes x 3 doses as needed for chest pain.     . Omega-3 Fatty Acids (FISH OIL) 1000 MG CAPS Take 1,000 mg by mouth at bedtime.    Marland Kitchen OZEMPIC, 0.25 OR 0.5 MG/DOSE, 2 MG/1.5ML SOPN Inject 0.5  mg into the skin every Friday.    . warfarin (COUMADIN) 2.5 MG tablet Take 2.5 mg by mouth every other day. Alternate every other day with 3 mg tablet in the evening  5  . warfarin (COUMADIN) 3 MG tablet Take 3 mg by mouth every other day. Alternate every other day with 2.5 mg tablet in the evening     No current facility-administered medications for this visit.    Review of Systems  Constitutional:  Constitutional negative. HENT: HENT negative.  Eyes: Eyes negative.  Respiratory: Respiratory negative.  Cardiovascular: Positive for leg swelling.  GI: Gastrointestinal negative.  Musculoskeletal: Positive for gait problem and leg pain.  Skin: Skin negative.  Hematologic: Hematologic/lymphatic negative.  Psychiatric: Psychiatric negative.        Objective:  Objective  Vitals:   06/16/20 1422  BP: 139/60  Pulse: 62  Resp: 20  Temp: 98.4 F (36.9 C)  SpO2: 95%    Physical Exam HENT:     Head: Normocephalic.     Nose:     Comments: Wearing a mask Eyes:     Pupils: Pupils are equal, round, and reactive to light.  Cardiovascular:     Comments: Strong dorsalis pedis and posterior tibial signals bilaterally Abdominal:     General: Abdomen is flat.     Palpations: Abdomen is soft. There is no mass.  Musculoskeletal:     Comments: Non pitting edema ble  Skin:    Capillary Refill: Capillary refill takes less than 2 seconds.  Neurological:     General: No focal deficit present.     Mental Status: He is alert and oriented to person, place, and time.  Psychiatric:        Mood and Affect: Mood normal.        Behavior: Behavior normal.        Thought Content: Thought content normal.        Judgment: Judgment normal.     Data: ABI Findings:  +---------+------------------+-----+----------+---------------------+  Right  Rt Pressure (mmHg)IndexWaveform Comment         +---------+------------------+-----+----------+---------------------+  Brachial 155                              +---------+------------------+-----+----------+---------------------+  PTA   117        0.74 monophasic             +---------+------------------+-----+----------+---------------------+  DP    108        0.68 monophasic             +---------+------------------+-----+----------+---------------------+  Great Toe            present  Great toe amputation.  +---------+------------------+-----+----------+---------------------+   +---------+------------------+-----+----------+-------+  Left   Lt Pressure (mmHg)IndexWaveform Comment  +---------+------------------+-----+----------+-------+  Brachial 159                      +---------+------------------+-----+----------+-------+  PTA   106        0.67 monophasic      +---------+------------------+-----+----------+-------+  DP    86        0.54 monophasic      +---------+------------------+-----+----------+-------+  Great Toe61        0.38            +---------+------------------+-----+----------+-------+   +-------+-----------+-----------+------------+------------+  ABI/TBIToday's ABIToday's TBIPrevious ABIPrevious TBI  +-------+-----------+-----------+------------+------------+  Right 0.74    amputation 0.62    amputation   +-------+-----------+-----------+------------+------------+  Left  0.67    0.38    0.55    0.39      +-------+-----------+-----------+------------+------------+         Assessment/Plan:     80 year old male follows up after intervention of his right SFA back in October.  He has persistent monophasic ABIs with a previously healed great toe amputation on the right toe pressure on the left is 61.  Most of his pain appears to be neuropathic in nature and also secondary to  significant edema.  He has declined intervention of his veins in the past and given that he may ultimately require a vein for bypass we have agreed to avoid this.  I have recommended compression stockings at least gentle knee-high compression stockings as tolerated.  He will follow-up in 1 year with repeat lower extremity ABIs and duplexes.  I would not recommend any intervention unless absolutely necessary given that all of his wounds are healed and his pain is certainly multifactorial.     Maeola Harman MD [facility]

## 2020-06-27 ENCOUNTER — Other Ambulatory Visit: Payer: Self-pay | Admitting: Cardiology

## 2020-07-10 ENCOUNTER — Other Ambulatory Visit: Payer: Self-pay | Admitting: Cardiology

## 2020-08-01 ENCOUNTER — Other Ambulatory Visit: Payer: Self-pay | Admitting: Cardiology

## 2020-09-25 ENCOUNTER — Other Ambulatory Visit: Payer: Self-pay | Admitting: Cardiology

## 2020-11-20 ENCOUNTER — Other Ambulatory Visit: Payer: Self-pay | Admitting: Cardiology

## 2020-12-31 ENCOUNTER — Other Ambulatory Visit: Payer: Self-pay | Admitting: Cardiology

## 2020-12-31 NOTE — Telephone Encounter (Signed)
  *  STAT* If patient is at the pharmacy, call can be transferred to refill team.   1. Which medications need to be refilled? (please list name of each medication and dose if known) clopidogrel (PLAVIX) 75 MG tablet  2. Which pharmacy/location (including street and city if local pharmacy) is medication to be sent to? [facility] [place] - [address]  3. Do they need a 30 day or 90 day supply? 30 days  Pharmacy needs refill today to arrange all pt's prescription and sent to the pt

## 2021-01-01 NOTE — Telephone Encounter (Signed)
He is not on Plavix from a cardiac standpoint. He was placed on this by vascular surgery for prior peripheral stents. I don't know if this is still indicated. Would defer to [facility]. We should not fill  Paul Ganus [place] MD, [facility]

## 2021-01-01 NOTE — Telephone Encounter (Signed)
Upon review of patient's chart I am sending this to pharm D and/or MD to review since patient is also taking Warfarin 2.5 mg every other day and 3 mg every other day. Patient is requesting refill of Plavix 75 mg daily and was last seen in office by Dr. [place] in 10/2018 and last virtual visit was in 04/2019 where patient was told to follow up in 6 months. Please review refill request

## 2021-01-04 ENCOUNTER — Telehealth: Payer: Self-pay

## 2021-01-04 NOTE — Telephone Encounter (Signed)
Pt called about needing to remain on Plavix, as he is due for a refill. Per APP, he can switch 81 mg Aspirin daily. Pt verbalized understanding and has no further questions/concerns at this time.

## 2021-06-08 ENCOUNTER — Other Ambulatory Visit: Payer: Self-pay

## 2021-06-08 DIAGNOSIS — I7025 Atherosclerosis of native arteries of other extremities with ulceration: Secondary | ICD-10-CM

## 2021-06-22 NOTE — Progress Notes (Signed)
HISTORY AND PHYSICAL     CC:  follow up. Requesting Provider:  Raelene Bott, MD  HPI: This is a 81 y.o. male who is here today for follow up for PAD with hx of multiple interventions on BLE.  He has hx of bilateral great toe amputations.    PAD intervention history: -Drug-coated balloon angioplasty left SFA , Balloon angioplasty of anterior tibial artery on left 05/01/2017 by Dr. Donzetta Matters.   -Stenting of right SFA 05/15/2017 by Dr. Donzetta Matters. -Jetstream atherectomy of right SFA in-stent occlusion and stenosis and Drug-coated balloon angioplasty of right SFA stents 01/15/2018 by Dr. Donzetta Matters.  -Laser arthrectomy with 2.0 [place] right SFA and Drug-coated balloon angioplasty right SFA for in-stent stenosis 11/18/2019 by Dr. Donzetta Matters  Pt was last seen 06/16/2020 by Dr. Donzetta Matters and at that time, he was having persistent pain in BLE associated with neuropathy and swelling.  He was not wearing compression due to difficulty getting them on and off.  His legs did feel better when the swelling was down.  They were much less edematous in the am.  He did not have any new wounds or tissue loss.  He has declined intervention of his veins in the past and given that he may ultimately require a vein for bypass we have agreed to avoid this.  Dr. Donzetta Matters recommended gentle compression with one year follow up.    The pt returns today for follow up.  His biggest complaint is neuropathy.  He states that he continues to have swelling in his legs but he cannot put on the compression socks.  He does not have any new wounds on his feet.  He does not get cramping in his calves with ambulation.  He states when he elevates his legs, they feel better.   He states his coumadin and plavix were discontinued and he was started on Eliquis as he has hx of DVT/PE.    The pt is on a statin for cholesterol management.    The pt is not on an aspirin.    Other AC:  Eliquis The pt is on CCB, ACEI, BB, diuretic for hypertension.  The pt does  have  diabetes. Tobacco hx:  former   Past Medical History:  Diagnosis Date   Arthritis    Chronic renal insufficiency    CKD (chronic kidney disease), stage III (HCC)    COPD (chronic obstructive pulmonary disease) (HCC)    with restriction   Coronary disease October 2010   Status post stenting of the proximal to mid right coronary with DES.  Status post extensive stenting of the proximal right coronary to the crux.  He has moderate disease in  the left coronary system   Depression    Diabetes mellitus    Insulin Dependent   Diastolic dysfunction    Congestive heart failure with   Dyspnea    multifactorial. chronic, 11/18/9- not an issue at this time   Gout    History of blood transfusion    History of diabetic ulcer of foot    right   Hypertension    Hypoventilation    syndrome   Obesity    Pneumonia    Pulmonary embolism [place]) March 2010   hx of and DVT in March of 2010 on chronic Coumadin therapy   Pulmonary hypertension (HCC)    Moderate to severe   Retroperitoneal bleed    Hx of right pelvic   Sleep apnea    Obstructive , no CPAP   Wears  dentures     Past Surgical History:  Procedure Laterality Date   ABDOMINAL AORTOGRAM W/LOWER EXTREMITY Right 01/15/2018   Procedure: ABDOMINAL AORTOGRAM W/LOWER EXTREMITY;  Surgeon: Waynetta Sandy, MD;  Location: [place] [facility];  Service: Cardiovascular;  Laterality: Right;   ABDOMINAL AORTOGRAM W/LOWER EXTREMITY Right 11/18/2019   Procedure: ABDOMINAL AORTOGRAM W/LOWER EXTREMITY;  Surgeon: Waynetta Sandy, MD;  Location: [place] [facility];  Service: Cardiovascular;  Laterality: Right;   AMPUTATION TOE Left 05/04/2017   Procedure: Left hallux amputation;  Surgeon: Wylene Simmer, MD;  Location: [place];  Service: Orthopedics;  Laterality: Left;   AMPUTATION TOE Right 12/21/2017   Procedure: Right hallux amputation;  Surgeon: Wylene Simmer, MD;  Location: [place];  Service:  Orthopedics;  Laterality: Right;  62min   AMPUTATION TOE Right 02/15/2018   Procedure: Right 2nd toe amputation;  Surgeon: Wylene Simmer, MD;  Location: [place];  Service: Orthopedics;  Laterality: Right;  60   BACK SURGERY  2000   post   CARDIOVASCULAR STRESS TEST  11-14-2008   EF 44%   EXPLORATORY LAPAROTOMY  1972   POST   LOWER EXTREMITY ANGIOGRAPHY N/A 05/01/2017   Procedure: LOWER EXTREMITY ANGIOGRAPHY;  Surgeon: Waynetta Sandy, MD;  Location: [place] [facility];  Service: Cardiovascular;  Laterality: N/A;   LOWER EXTREMITY ANGIOGRAPHY N/A 05/15/2017   Procedure: LOWER EXTREMITY ANGIOGRAPHY;  Surgeon: Waynetta Sandy, MD;  Location: [place] [facility];  Service: Cardiovascular;  Laterality: N/A;   MULTIPLE TOOTH EXTRACTIONS     PERIPHERAL VASCULAR ATHERECTOMY  05/01/2017   Procedure: PERIPHERAL VASCULAR ATHERECTOMY;  Surgeon: Waynetta Sandy, MD;  Location: [place] [facility];  Service: Cardiovascular;;  Left SFA   PERIPHERAL VASCULAR ATHERECTOMY Right 11/18/2019   Procedure: PERIPHERAL VASCULAR ATHERECTOMY;  Surgeon: Waynetta Sandy, MD;  Location: [place] [facility];  Service: Cardiovascular;  Laterality: Right;  SFA   PERIPHERAL VASCULAR BALLOON ANGIOPLASTY  05/01/2017   Procedure: PERIPHERAL VASCULAR BALLOON ANGIOPLASTY;  Surgeon: Waynetta Sandy, MD;  Location: [place] [facility];  Service: Cardiovascular;;  Left Anterial Tibial Artery   PERIPHERAL VASCULAR BALLOON ANGIOPLASTY Right 01/15/2018   Procedure: PERIPHERAL VASCULAR BALLOON ANGIOPLASTY;  Surgeon: Waynetta Sandy, MD;  Location: [place] [facility];  Service: Cardiovascular;  Laterality: Right;  SFA atherectomy   PERIPHERAL VASCULAR BALLOON ANGIOPLASTY Right 11/18/2019   Procedure: PERIPHERAL VASCULAR BALLOON ANGIOPLASTY;  Surgeon: Waynetta Sandy, MD;  Location: [place] [facility];  Service: Cardiovascular;  Laterality: Right;  SFA   PERIPHERAL VASCULAR  INTERVENTION Right 05/15/2017   Procedure: PERIPHERAL VASCULAR INTERVENTION;  Surgeon: Waynetta Sandy, MD;  Location: [place] [facility];  Service: Cardiovascular;  Laterality: Right;  SFA Stent   US ECHOCARDIOGRAPHY  11-25-2008   EF 55-60%    No Known Allergies  Current Outpatient Medications  Medication Sig Dispense Refill   acetaminophen (TYLENOL) 500 MG tablet Take 2 tablets (1,000 mg total) by mouth 3 (three) times daily.     albuterol (PROVENTIL) (2.5 MG/3ML) 0.083% nebulizer solution Take 2.5 mg by nebulization every 4 (four) hours as needed for wheezing or shortness of breath.      allopurinol (ZYLOPRIM) 300 MG tablet Take 300 mg by mouth daily.   11   amLODipine (NORVASC) 5 MG tablet Take 5 mg by mouth daily.     atorvastatin (LIPITOR) 40 MG tablet Take 1 tablet (40 mg total) by mouth daily. 90 tablet 3   busPIRone (  BUSPAR) 15 MG tablet Take 15 mg by mouth 2 (two) times daily.     clopidogrel (PLAVIX) 75 MG tablet TAKE ONE TABLET BY MOUTH EVERY MORNING 30 tablet 0   doxazosin (CARDURA) 4 MG tablet Take 4 mg by mouth every evening.      DULoxetine (CYMBALTA) 30 MG capsule Take 30 mg by mouth daily.     DULoxetine (CYMBALTA) 60 MG capsule Take 60 mg by mouth daily.     furosemide (LASIX) 20 MG tablet Take 20-40 mg by mouth in the morning and at bedtime.      gabapentin (NEURONTIN) 300 MG capsule Take 900 mg by mouth 3 (three) times daily.      glucagon 1 MG injection Inject 1 mg into the vein once as needed (for low blood sugars.).      insulin glargine (LANTUS) 100 unit/mL SOPN Inject 40 Units into the skin at bedtime.     insulin lispro (HUMALOG) 100 UNIT/ML KwikPen Inject 10 Units into the skin in the morning, at noon, and at bedtime.     ipratropium (ATROVENT) 0.02 % nebulizer solution Take 0.5 mg by nebulization every 4 (four) hours as needed for wheezing or shortness of breath.      lisinopril (ZESTRIL) 10 MG tablet Take 10 mg by mouth daily.      magnesium oxide  (MAG-OX) 400 MG tablet Take 400 mg by mouth 2 (two) times daily.     metoprolol succinate (TOPROL-XL) 100 MG 24 hr tablet Take 150 mg by mouth daily.     mirtazapine (REMERON) 7.5 MG tablet Take 7.5 mg by mouth at bedtime.     Multiple Vitamin (MULTIVITAMIN WITH MINERALS) TABS tablet Take 1 tablet by mouth daily. ABC Complete Senior Multivitamin for 50+     nitroGLYCERIN (NITROSTAT) 0.4 MG SL tablet Place 0.4 mg under the tongue every 5 (five) minutes x 3 doses as needed for chest pain.      Omega-3 Fatty Acids (FISH OIL) 1000 MG CAPS Take 1,000 mg by mouth at bedtime.     OZEMPIC, 0.25 OR 0.5 MG/DOSE, 2 MG/1.5ML SOPN Inject 0.5 mg into the skin every Friday.     warfarin (COUMADIN) 2.5 MG tablet Take 2.5 mg by mouth every other day. Alternate every other day with 3 mg tablet in the evening  5   warfarin (COUMADIN) 3 MG tablet Take 3 mg by mouth every other day. Alternate every other day with 2.5 mg tablet in the evening     No current facility-administered medications for this visit.    Family History  Problem Relation Age of Onset   Heart attack Mother    Coronary artery disease Mother    Hypertension Mother    Coronary artery disease Father    Heart attack Father     Social History   Socioeconomic History   Marital status: Divorced    Spouse name: Not on file   Number of children: 3   Years of education: Not on file   Highest education level: Not on file  Occupational History   Occupation: disabled    Employer: RETIRED  Tobacco Use   Smoking status: Former    Years: 2.00    Types: Cigarettes    Quit date: 02/01/1980    Years since quitting: 41.4   Smokeless tobacco: Never  Vaping Use   Vaping Use: Never used  Substance and Sexual Activity   Alcohol use: Not Currently   Drug use: Never   Sexual activity:  Not on file  Other Topics Concern   Not on file  Social History Narrative   Not on file   Social Determinants of Health   Financial Resource Strain: Not on file   Food Insecurity: Not on file  Transportation Needs: Not on file  Physical Activity: Not on file  Stress: Not on file  Social Connections: Not on file  Intimate Partner Violence: Not on file     REVIEW OF SYSTEMS:   [X]  denotes positive finding, [ ]  denotes negative finding Cardiac  Comments:  Chest pain or chest pressure:    Shortness of breath upon exertion:    Short of breath when lying flat:    Irregular heart rhythm: x       Vascular    Pain in calf, thigh, or hip brought on by ambulation:    Pain in feet at night that wakes you up from your sleep:     Blood clot in your veins:    Leg swelling:  x       Pulmonary    Oxygen at home:    Productive cough:     Wheezing:         Neurologic    Sudden weakness in arms or legs:     Sudden numbness in arms or legs:     Sudden onset of difficulty speaking or slurred speech:    Temporary loss of vision in one eye:     Problems with dizziness:         Gastrointestinal    Blood in stool:     Vomited blood:         Genitourinary    Burning when urinating:     Blood in urine:        Psychiatric    Major depression:         Hematologic    Bleeding problems:    Problems with blood clotting too easily:        Skin    Rashes or ulcers:        Constitutional    Fever or chills:      PHYSICAL EXAMINATION:  Today's Vitals   06/24/21 1439  BP: (!) 152/70  Pulse: 64  Resp: 20  Temp: 97.6 F (36.4 C)  TempSrc: Temporal  SpO2: 97%  Weight: 248 lb (112.5 kg)  Height: 5\' 9"  (1.753 m)  PainSc: 8   PainLoc: Leg   Body mass index is 36.62 kg/m.   General:  WDWN in NAD; vital signs documented above Gait: Not observed HENT: WNL, normocephalic Pulmonary: normal non-labored breathing , without wheezing Cardiac: regular HR, without carotid bruits Abdomen: soft, NT, no masses; aortic pulse is not palpable Skin: without rashes Vascular Exam/Pulses:  Right Left  Radial 2+ (normal) 2+ (normal)  Femoral 2+  (normal) 2+ (normal)  DP monophasic Monophasic   PT Monophasic  monophasic  Peroneal monophasic monophasic   Extremities: without ischemic changes, without Gangrene , without cellulitis; without open wounds; +hemosiderin staining BLE with swelling bilaterally Musculoskeletal: no muscle wasting or atrophy  Neurologic: A&O X 3 Psychiatric:  The pt has Normal affect.   Non-Invasive Vascular Imaging:   ABI's/TBI's on 06/24/2021: Right:  0.76/amp - Great toe pressure: amp Left:  0.62/amp - Great toe pressure: amp  Arterial duplex on 06/24/2021: +-----------+--------+-----+---------------+----------+--------+  RIGHT      PSV cm/sRatioStenosis       Waveform  Comments  +-----------+--------+-----+---------------+----------+--------+  CFA Prox   172  biphasic            +-----------+--------+-----+---------------+----------+--------+  CFA Distal 161                         biphasic            +-----------+--------+-----+---------------+----------+--------+  DFA        147                         biphasic  brpad     +-----------+--------+-----+---------------+----------+--------+  SFA Prox   480          75-99% stenosismonophasic          +-----------+--------+-----+---------------+----------+--------+  POP Mid    89                          monophasic          +-----------+--------+-----+---------------+----------+--------+  ATA Distal 20                          monophasic          +-----------+--------+-----+---------------+----------+--------+  PTA Prox   63                          monophasic          +-----------+--------+-----+---------------+----------+--------+  PERO Distal17                          monophasic          +-----------+--------+-----+---------------+----------+--------+        Right Stent(s):  +---------------+---+---------------+----------++  Prox to Stent  29450-99%  stenosismonophasic  +---------------+---+---------------+----------++  Proximal Stent 193               monophasic  +---------------+---+---------------+----------++  Mid Stent      104               monophasic  +---------------+---+---------------+----------++  Distal Stent   116               monophasic  +---------------+---+---------------+----------++  Distal to Stent66                monophasic  +---------------+---+---------------+----------++               +-----------+---------+-----+---------------+----------+--------+  LEFT       PSV cm/s RatioStenosis       Waveform  Comments  +-----------+---------+-----+---------------+----------+--------+  CFA Prox   193                          monophasic          +-----------+---------+-----+---------------+----------+--------+  CFA Distal 158                          monophasic          +-----------+---------+-----+---------------+----------+--------+  DFA        189                          biphasic            +-----------+---------+-----+---------------+----------+--------+  SFA Prox   133 / 397     75-99% stenosismonophasic          +-----------+---------+-----+---------------+----------+--------+  SFA Mid    216  50-74% stenosismonophasic          +-----------+---------+-----+---------------+----------+--------+  SFA Distal 83                           monophasic          +-----------+---------+-----+---------------+----------+--------+  POP Mid    77                           monophasic          +-----------+---------+-----+---------------+----------+--------+  POP Distal 77                                               +-----------+---------+-----+---------------+----------+--------+  ATA Distal 40                           monophasic          +-----------+---------+-----+---------------+----------+--------+  PTA  Distal 36                           monophasic          +-----------+---------+-----+---------------+----------+--------+  PERO Distal17                           monophasic          +-----------+---------+-----+---------------+----------+--------+   Summary:  Right: 75-99% stenosis noted in the superficial femoral artery. Patent stent with increased velocity in the 50 - 99% stenosis in the proximal segment, limited visualization   Left: 75 - 99% stenosis, upper end of range noted in the superficial femoral artery. Prior exam of 11/01/2019 revealed increased velocity in the mid SFA of 463 cm/s.   Previous ABI's/TBI's on 06/16/2020: Right:  0.74/absent - Great toe pressure: toe amp Left:  0.67/0.38 - Great toe pressure:  61  Previous arterial duplex on 06/16/2020: +-----------+--------+-----+--------+----------+--------+  RIGHT      PSV cm/sRatioStenosisWaveform  Comments  +-----------+--------+-----+--------+----------+--------+  CFA Distal 179                  biphasic            +-----------+--------+-----+--------+----------+--------+  DFA        200                  biphasic            +-----------+--------+-----+--------+----------+--------+  SFA Prox   496                  monophasic          +-----------+--------+-----+--------+----------+--------+  SFA Mid                                   stent     +-----------+--------+-----+--------+----------+--------+  SFA Distal 108                  monophasic          +-----------+--------+-----+--------+----------+--------+  POP Prox   73                   monophasic          +-----------+--------+-----+--------+----------+--------+  POP Distal 80  monophasic          +-----------+--------+-----+--------+----------+--------+  ATA Distal 41                   monophasic          +-----------+--------+-----+--------+----------+--------+  PTA  Distal 55                   monophasic          +-----------+--------+-----+--------+----------+--------+  PERO Distal34                   monophasic          +-----------+--------+-----+--------+----------+--------+      Right Stent(s):  +--------------+---++----------++  Prox to Stent 36monophasic  +--------------+---++----------++  Proximal Stent24monophasic  +--------------+---++----------++  Mid Stent     55 monophasic  +--------------+---++----------++   ASSESSMENT/PLAN:: 81 y.o. male here for follow up for PAD with hx of multiple BLE interventions here for follow up.    -pt continues to have neuropathic symptoms in his feet.  Tennis Must does have venous insufficiency and feels his legs are better elevated.  His duplex is essentially unchanged.  He does not have classic rest pain, claudication or non healing wounds.  He will continue to elevate his legs as tolerated.  -continue statin.  His plavix and coumadin were discontinued.  He is now on Eliquis.   -discussed results with Dr. Donzetta Matters and pt will f/u in one year with BLE arterial duplex and ABI.  He will call sooner if he develops any issues before then.   Leontine Locket, [facility] [phone]  Clinic MD:   Virl Cagey

## 2021-06-24 ENCOUNTER — Ambulatory Visit (INDEPENDENT_AMBULATORY_CARE_PROVIDER_SITE_OTHER)
Admission: RE | Admit: 2021-06-24 | Discharge: 2021-06-24 | Disposition: A | Discharge status: Home / Self Care | Payer: Medicare Other | Source: Ambulatory Visit | Attending: Vascular Surgery | Admitting: Vascular Surgery

## 2021-06-24 ENCOUNTER — Ambulatory Visit (INDEPENDENT_AMBULATORY_CARE_PROVIDER_SITE_OTHER): Payer: Medicare Other | Admitting: Physician Assistant

## 2021-06-24 ENCOUNTER — Ambulatory Visit (HOSPITAL_COMMUNITY)
Admission: RE | Admit: 2021-06-24 | Discharge: 2021-06-24 | Disposition: A | Discharge status: Home / Self Care | Payer: Medicare Other | Source: Ambulatory Visit | Attending: Vascular Surgery | Admitting: Vascular Surgery

## 2021-06-24 VITALS — BP 152/70 | HR 64 | Temp 97.6°F | Resp 20 | Ht 69.0 in | Wt 248.0 lb

## 2021-06-24 DIAGNOSIS — I7025 Atherosclerosis of native arteries of other extremities with ulceration: Secondary | ICD-10-CM

## 2021-06-24 DIAGNOSIS — I872 Venous insufficiency (chronic) (peripheral): Secondary | ICD-10-CM | POA: Diagnosis not present

## 2021-06-25 ENCOUNTER — Encounter: Payer: Self-pay | Admitting: Physician Assistant

## 2022-10-13 ENCOUNTER — Telehealth: Payer: Self-pay | Admitting: *Deleted

## 2022-12-13 ENCOUNTER — Other Ambulatory Visit: Payer: Self-pay | Admitting: *Deleted

## 2022-12-13 DIAGNOSIS — I7025 Atherosclerosis of native arteries of other extremities with ulceration: Secondary | ICD-10-CM

## 2022-12-28 ENCOUNTER — Ambulatory Visit (HOSPITAL_COMMUNITY)
Admission: RE | Admit: 2022-12-28 | Discharge: 2022-12-28 | Disposition: A | Discharge status: Home / Self Care | Payer: 59 | Source: Ambulatory Visit | Attending: Vascular Surgery | Admitting: Vascular Surgery

## 2022-12-28 ENCOUNTER — Ambulatory Visit (INDEPENDENT_AMBULATORY_CARE_PROVIDER_SITE_OTHER)
Admission: RE | Admit: 2022-12-28 | Discharge: 2022-12-28 | Disposition: A | Discharge status: Home / Self Care | Payer: 59 | Source: Ambulatory Visit | Attending: Vascular Surgery | Admitting: Vascular Surgery

## 2022-12-28 ENCOUNTER — Ambulatory Visit (INDEPENDENT_AMBULATORY_CARE_PROVIDER_SITE_OTHER): Payer: 59 | Admitting: Physician Assistant

## 2022-12-28 VITALS — BP 149/66 | HR 59 | Temp 98.5°F | Resp 18 | Ht 69.0 in | Wt 246.6 lb

## 2022-12-28 DIAGNOSIS — I7025 Atherosclerosis of native arteries of other extremities with ulceration: Secondary | ICD-10-CM | POA: Insufficient documentation

## 2022-12-28 DIAGNOSIS — I872 Venous insufficiency (chronic) (peripheral): Secondary | ICD-10-CM

## 2022-12-28 LAB — VAS US ABI WITH/WO TBI
Left ABI: 0.77
Right ABI: 0.89

## 2022-12-28 NOTE — H&P (View-Only) (Signed)
 Office Note     CC:  follow up Requesting Provider:  Lindwood Qua, MD  HPI: Paul Chen is a 82 y.o. (12/05/1940) male who presents for follow up of PAD. He has very extensive vascular surgery history as follows :  -Drug-coated balloon angioplasty left SFA , Balloon angioplasty of anterior tibial artery on left 05/01/2017 by Dr. Randie Heinz.   -Stenting of right SFA 05/15/2017 by Dr. Randie Heinz. -Jetstream atherectomy of right SFA in-stent occlusion and stenosis and Drug-coated balloon angioplasty of right SFA stents 01/15/2018 by Dr. Randie Heinz.  -Laser arthrectomy with 2.0 [place] right SFA and Drug-coated balloon angioplasty right SFA for in-stent stenosis 11/18/2019 by Dr. Randie Heinz  He has had significant neuropathy as well as bilateral lower extremity edema over the years. He has not tolerated compression stockings as he lives alone and is unable to don and doff the compression. He does elevate regularly which helps. He says today these things continue to be his biggest issues, especially his neuropathy. He has numbness, burning and tingling in his feet that bothers him all the time. He does take Gabapentin for this, which helps a little. He also reports continued swelling with tightness in his legs up to level of his knees. He uses cane to ambulate. He does not describe classic claudication symptoms or rest pain. He does report a ulcer on bottom of left big toe. This has been present for a couple months but he is not really sure. He does not know if it is getting better or worse. He says he is unable to really get to the toe to dress or clean it. He showers and says he just makes sure to put on clean socks daily. He also recently attempted to trim the toe nails on his right foot and clipped his nails a little too short so had a little bleeding from this. He is compliant with his Statin and Eliquis. He has a continuous glucose monitor and says that his numbers run between 70-180. He says since his last hospitalization  he has had trouble getting them well controlled. He reports previously they were 95% controlled based on his monitor but now he is around 87%.   The pt is on a statin for cholesterol management.    The pt is not on an aspirin. Other AC:  Eliquis The pt is on CCB, ACEI, BB, diuretic for hypertension.  The pt does  have diabetes. Tobacco hx:  former  Past Medical History:  Diagnosis Date   Arthritis    Chronic renal insufficiency    CKD (chronic kidney disease), stage III (HCC)    COPD (chronic obstructive pulmonary disease) (HCC)    with restriction   Coronary disease October 2010   Status post stenting of the proximal to mid right coronary with DES.  Status post extensive stenting of the proximal right coronary to the crux.  He has moderate disease in  the left coronary system   Depression    Diabetes mellitus    Insulin Dependent   Diastolic dysfunction    Congestive heart failure with   Dyspnea    multifactorial. chronic, 11/18/9- not an issue at this time   Gout    History of blood transfusion    History of diabetic ulcer of foot    right   Hypertension    Hypoventilation    syndrome   Obesity    Pneumonia    Pulmonary embolism [facility]) March 2010   hx of and DVT in March  of 2010 on chronic Coumadin therapy   Pulmonary hypertension (HCC)    Moderate to severe   Retroperitoneal bleed    Hx of right pelvic   Sleep apnea    Obstructive , no CPAP   Wears dentures     Past Surgical History:  Procedure Laterality Date   ABDOMINAL AORTOGRAM W/LOWER EXTREMITY Right 01/15/2018   Procedure: ABDOMINAL AORTOGRAM W/LOWER EXTREMITY;  Surgeon: Maeola Harman, MD;  Location: [facility] INVASIVE CV LAB;  Service: Cardiovascular;  Laterality: Right;   ABDOMINAL AORTOGRAM W/LOWER EXTREMITY Right 11/18/2019   Procedure: ABDOMINAL AORTOGRAM W/LOWER EXTREMITY;  Surgeon: Maeola Harman, MD;  Location: [facility] INVASIVE CV LAB;  Service: Cardiovascular;  Laterality: Right;    AMPUTATION TOE Left 05/04/2017   Procedure: Left hallux amputation;  Surgeon: Toni Arthurs, MD;  Location: [facility];  Service: Orthopedics;  Laterality: Left;   AMPUTATION TOE Right 12/21/2017   Procedure: Right hallux amputation;  Surgeon: Toni Arthurs, MD;  Location: [facility];  Service: Orthopedics;  Laterality: Right;    AMPUTATION TOE Right 02/15/2018   Procedure: Right 2nd toe amputation;  Surgeon: Toni Arthurs, MD;  Location: [facility] OR;  Service: Orthopedics;  Laterality: Right;  60   BACK SURGERY  2000   post   CARDIOVASCULAR STRESS TEST  11-14-2008   EF 44%   EXPLORATORY LAPAROTOMY  1972   POST   LOWER EXTREMITY ANGIOGRAPHY N/A 05/01/2017   Procedure: LOWER EXTREMITY ANGIOGRAPHY;  Surgeon: Maeola Harman, MD;  Location: [facility] INVASIVE CV LAB;  Service: Cardiovascular;  Laterality: N/A;   LOWER EXTREMITY ANGIOGRAPHY N/A 05/15/2017   Procedure: LOWER EXTREMITY ANGIOGRAPHY;  Surgeon: Maeola Harman, MD;  Location: [facility] INVASIVE CV LAB;  Service: Cardiovascular;  Laterality: N/A;   MULTIPLE TOOTH EXTRACTIONS     PERIPHERAL VASCULAR ATHERECTOMY  05/01/2017   Procedure: PERIPHERAL VASCULAR ATHERECTOMY;  Surgeon: Maeola Harman, MD;  Location: [facility] INVASIVE CV LAB;  Service: Cardiovascular;;  Left SFA   PERIPHERAL VASCULAR ATHERECTOMY Right 11/18/2019   Procedure: PERIPHERAL VASCULAR ATHERECTOMY;  Surgeon: Maeola Harman, MD;  Location: [facility] INVASIVE CV LAB;  Service: Cardiovascular;  Laterality: Right;  SFA   PERIPHERAL VASCULAR BALLOON ANGIOPLASTY  05/01/2017   Procedure: PERIPHERAL VASCULAR BALLOON ANGIOPLASTY;  Surgeon: Maeola Harman, MD;  Location: [facility] INVASIVE CV LAB;  Service: Cardiovascular;;  Left Anterial Tibial Artery   PERIPHERAL VASCULAR BALLOON ANGIOPLASTY Right 01/15/2018   Procedure: PERIPHERAL VASCULAR BALLOON ANGIOPLASTY;  Surgeon: Maeola Harman, MD;  Location: [facility] INVASIVE CV LAB;  Service:  Cardiovascular;  Laterality: Right;  SFA atherectomy   PERIPHERAL VASCULAR BALLOON ANGIOPLASTY Right 11/18/2019   Procedure: PERIPHERAL VASCULAR BALLOON ANGIOPLASTY;  Surgeon: Maeola Harman, MD;  Location: [facility] INVASIVE CV LAB;  Service: Cardiovascular;  Laterality: Right;  SFA   PERIPHERAL VASCULAR INTERVENTION Right 05/15/2017   Procedure: PERIPHERAL VASCULAR INTERVENTION;  Surgeon: Maeola Harman, MD;  Location: [facility] INVASIVE CV LAB;  Service: Cardiovascular;  Laterality: Right;  SFA Stent   US ECHOCARDIOGRAPHY  11-25-2008   EF 55-60%    Social History   Socioeconomic History   Marital status: Divorced    Spouse name: Not on file   Number of children: 3   Years of education: Not on file   Highest education level: Not on file  Occupational History   Occupation: disabled    Employer: RETIRED  Tobacco Use   Smoking status: Former    Current packs/day: 0.00    Types: Cigarettes  Start date: 01/31/1978    Quit date: 02/01/1980    Years since quitting: 42.9    Passive exposure: Never   Smokeless tobacco: Never  Vaping Use   Vaping status: Never Used  Substance and Sexual Activity   Alcohol use: Not Currently   Drug use: Never   Sexual activity: Not on file  Other Topics Concern   Not on file  Social History Narrative   Not on file   Social Determinants of Health   Financial Resource Strain: Low Risk  (02/15/2022)   Received from [facility]   Overall Financial Resource Strain (CARDIA)    Difficulty of Paying Living Expenses: Not hard at all  Food Insecurity: No Food Insecurity (02/15/2022)   Received from [facility]   Hunger Vital Sign    Worried About Running Out of Food in the Last Year: Never true    Ran Out of Food in the Last Year: Never true  Transportation Needs: No Transportation Needs (02/15/2022)   Received from [facility]   PRAPARE - Transportation    Lack of Transportation (Medical): No    Lack of Transportation (Non-Medical):  No  Physical Activity: Sufficiently Active (04/22/2022)   Received from [facility]   Exercise Vital Sign    Days of Exercise per Week: 3 days    Minutes of Exercise per Session: 60 min  Stress: No Stress Concern Present (04/22/2022)   Received from [facility] - Occupational Stress Questionnaire    Feeling of Stress : Only a little  Social Connections: Moderately Integrated (04/22/2022)   Received from [facility]   Social Connection and Isolation Panel [NHANES]    Frequency of Communication with Friends and Family: More than three times a week    Frequency of Social Gatherings with Friends and Family: More than three times a week    Attends Religious Services: More than 4 times per year    Active Member of [employer] or Organizations: Yes    Attends Engineer, structural: More than 4 times per year    Marital Status: Divorced  Catering manager Violence: Not At Risk (01/04/2021)   Received from [facility]   Humiliation, Afraid, Rape, and Kick questionnaire    Fear of Current or Ex-Partner: No    Emotionally Abused: No    Physically Abused: No    Sexually Abused: No   Family History  Problem Relation Age of Onset   Heart attack Mother    Coronary artery disease Mother    Hypertension Mother    Coronary artery disease Father    Heart attack Father     Current Outpatient Medications  Medication Sig Dispense Refill   acetaminophen (TYLENOL) 500 MG tablet Take 2 tablets (1,000 mg total) by mouth 3 (three) times daily.     albuterol (PROVENTIL) (2.5 MG/3ML) 0.083% nebulizer solution Take 2.5 mg by nebulization every 4 (four) hours as needed for wheezing or shortness of breath.      allopurinol (ZYLOPRIM) 300 MG tablet Take 300 mg by mouth daily.   11   amLODipine (NORVASC) 5 MG tablet Take 5 mg by mouth daily.     atorvastatin (LIPITOR) 40 MG tablet Take 1 tablet (40 mg total) by mouth daily. 90 tablet 3    busPIRone (BUSPAR) 15 MG tablet Take 15 mg by mouth 2 (two) times daily.     doxazosin (CARDURA) 4 MG  tablet Take 4 mg by mouth every evening.      DULoxetine (CYMBALTA) 30 MG capsule Take 30 mg by mouth daily.     DULoxetine (CYMBALTA) 60 MG capsule Take 60 mg by mouth daily.     ELIQUIS 5 MG TABS tablet Take 5 mg by mouth 2 (two) times daily.     gabapentin (NEURONTIN) 300 MG capsule Take 900 mg by mouth 3 (three) times daily.      glucagon 1 MG injection Inject 1 mg into the vein once as needed (for low blood sugars.).      insulin glargine (LANTUS) 100 unit/mL SOPN Inject 40 Units into the skin at bedtime.     insulin lispro (HUMALOG) 100 UNIT/ML KwikPen Inject 10 Units into the skin in the morning, at noon, and at bedtime.     ipratropium (ATROVENT) 0.02 % nebulizer solution Take 0.5 mg by nebulization every 4 (four) hours as needed for wheezing or shortness of breath.      lisinopril (ZESTRIL) 10 MG tablet Take 10 mg by mouth daily.      magnesium oxide (MAG-OX) 400 MG tablet Take 400 mg by mouth 2 (two) times daily.     metoprolol succinate (TOPROL-XL) 100 MG 24 hr tablet Take 150 mg by mouth daily.     mirtazapine (REMERON) 7.5 MG tablet Take 7.5 mg by mouth at bedtime.     Multiple Vitamin (MULTIVITAMIN WITH MINERALS) TABS tablet Take 1 tablet by mouth daily. ABC Complete Senior Multivitamin for 50+     nitroGLYCERIN (NITROSTAT) 0.4 MG SL tablet Place 0.4 mg under the tongue every 5 (five) minutes x 3 doses as needed for chest pain.      Omega-3 Fatty Acids (FISH OIL) 1000 MG CAPS Take 1,000 mg by mouth at bedtime.     OZEMPIC, 0.25 OR 0.5 MG/DOSE, 2 MG/1.5ML SOPN Inject 0.5 mg into the skin every Friday.     furosemide (LASIX) 20 MG tablet Take 20-40 mg by mouth in the morning and at bedtime.  (Patient not taking: Reported on 12/28/2022)     No current facility-administered medications for this visit.    No Known Allergies   REVIEW OF SYSTEMS:  [X]  denotes positive finding, [  ] denotes negative finding Cardiac  Comments:  Chest pain or chest pressure:    Shortness of breath upon exertion:    Short of breath when lying flat:    Irregular heart rhythm:        Vascular    Pain in calf, thigh, or hip brought on by ambulation:    Pain in feet at night that wakes you up from your sleep:     Blood clot in your veins:    Leg swelling:         Pulmonary    Oxygen at home:    Productive cough:     Wheezing:         Neurologic    Sudden weakness in arms or legs:     Sudden numbness in arms or legs:     Sudden onset of difficulty speaking or slurred speech:    Temporary loss of vision in one eye:     Problems with dizziness:         Gastrointestinal    Blood in stool:     Vomited blood:         Genitourinary    Burning when urinating:     Blood in urine:  Psychiatric    Major depression:         Hematologic    Bleeding problems:    Problems with blood clotting too easily:        Skin    Rashes or ulcers:        Constitutional    Fever or chills:      PHYSICAL EXAMINATION:  Vitals:   12/28/22 1127  BP: (!) 149/66  Pulse: (!) 59  Resp: 18  Temp: 98.5 F (36.9 C)  TempSrc: Temporal  SpO2: 96%  Weight: 246 lb 9.6 oz (111.9 kg)  Height: 5\' 9"  (1.753 m)    General:  WDWN in NAD; vital signs documented above Gait: Ambulates with cane HENT: WNL, normocephalic Pulmonary: normal non-labored breathing , without wheezing Cardiac: regular HR Abdomen: soft Vascular Exam/Pulses: 2+ femoral pulses, no palpable distal pulses. Doppler bilateral PT/ DP monophasic signals Extremities: without ischemic changes, without Gangrene , without cellulitis; with open wound on left plantar aspect of great toe     Musculoskeletal: no muscle wasting or atrophy  Neurologic: A&O X 3 Psychiatric:  The pt has Normal affect.   Non-Invasive Vascular Imaging:   +-------+-----------+-----------+------------+------------+  ABI/TBIToday's ABIToday's  TBIPrevious ABIPrevious TBI  +-------+-----------+-----------+------------+------------+  Right 0.89       -          0.76        -             +-------+-----------+-----------+------------+------------+  Left  0.77       -          0.62        -             +-------+-----------+-----------+------------+------------+   Bilateral ABIs appear increased. `  VAS [place] Lower extremity bypass graft Duplex: +-----------+--------+-----+---------------+--------------+--------+  RIGHT     PSV cm/sRatioStenosis       Waveform      Comments  +-----------+--------+-----+---------------+--------------+--------+  CFA Distal 152          30-49% stenosisbiphasic                +-----------+--------+-----+---------------+--------------+--------+  DFA       175          30-49% stenosisbiphasic                +-----------+--------+-----+---------------+--------------+--------+  SFA Prox   418          75-99% stenosisbiphasic                +-----------+--------+-----+---------------+--------------+--------+  SFA Mid    114                         biphasic                +-----------+--------+-----+---------------+--------------+--------+  SFA Distal 92                          biphasic                +-----------+--------+-----+---------------+--------------+--------+  POP Prox   88                          biphasic                +-----------+--------+-----+---------------+--------------+--------+  POP Mid    72  biphasic                +-----------+--------+-----+---------------+--------------+--------+  POP Distal 60                          biphasic                +-----------+--------+-----+---------------+--------------+--------+  ATA Distal 48                          biphasic                +-----------+--------+-----+---------------+--------------+--------+  PTA Distal 71                           biphasic                +-----------+--------+-----+---------------+--------------+--------+  PERO Distal23                          noise artifact          +-----------+--------+-----+---------------+--------------+--------+     Right Stent(s):  +---------------+--------+---------------+--------+----------+  Right SFA stentPSV cm/sStenosis       WaveformComments    +---------------+--------+---------------+--------+----------+  Prox to Stent  418     75-99%         biphasicSFA origin  +---------------+--------+---------------+--------+----------+  Proximal Stent 252     50-99% stenosisbiphasic            +---------------+--------+---------------+--------+----------+  Mid Stent      218     50-99% stenosisbiphasic            +---------------+--------+---------------+--------+----------+  Distal Stent   221     50-99% stenosisbiphasic            +---------------+--------+---------------+--------+----------+  Distal to Stent114                    biphasic            +---------------+--------+---------------+--------+----------+    +-----------+--------+-----+---------------+----------+--------+  LEFT      PSV cm/sRatioStenosis       Waveform  Comments  +-----------+--------+-----+---------------+----------+--------+  CFA Distal 157          30-49% stenosisbiphasic            +-----------+--------+-----+---------------+----------+--------+  DFA       159          30-49% stenosisbiphasic            +-----------+--------+-----+---------------+----------+--------+  SFA Prox   142                         triphasic           +-----------+--------+-----+---------------+----------+--------+  SFA Mid    112                         triphasic           +-----------+--------+-----+---------------+----------+--------+  SFA Distal 149                         triphasic            +-----------+--------+-----+---------------+----------+--------+  POP Prox   88                          biphasic            +-----------+--------+-----+---------------+----------+--------+  POP Mid    74                          biphasic            +-----------+--------+-----+---------------+----------+--------+  POP Distal 52                          monophasic          +-----------+--------+-----+---------------+----------+--------+  ATA Distal 182     3.5  50-74% stenosismonophasic          +-----------+--------+-----+---------------+----------+--------+  PTA Distal 57                          monophasic          +-----------+--------+-----+---------------+----------+--------+  PERO Distal40                          monophasic          +-----------+--------+-----+---------------+----------+--------+   Summary:  Right: 30-49% stenosis noted in the common femoral artery. 30-49% stenosis noted in the deep femoral artery. 75-99% stenosis noted in the superficial femoral artery. Right SFA stent 50-99% stenosis.   Left: 30-49% stenosis noted in the common femoral artery. 30-49% stenosis noted in the deep femoral artery. 50-74% stenosis noted in the anterior tibial artery.   ASSESSMENT/PLAN:: 82 y.o. male here for follow up of PAD. He has undergone multiple interventions on BLE. He is without any classic rest pain or claudication. He does have severe neuropathy, which causes him significant discomfort. He also has bilateral lower extremity edema that makes his legs feel very tight and heavy on ambulation. He has a new left great toe diabetic ulcer. There are no signs of infection in the toe. He has not been able to do any wound care on this as he cannot see the toe or get to it to dress it appropriately.  - ABIs are better than prior study 6 months ago - Duplex shows some areas of elevated velocities in the right SFA and SFA stent these are essentially  unchanged from his prior studies. On the LLE duplex shows some elevated velocities in the CF and also the AT. This is likely in area of prior intervention. - He has CKD at baseline. Most recent Scr was 2.3 in August. Will need to limit contrast or use CO2 for most of the Angiogram - Will try to get this arranged in the near future. In the mean time I am referring him to podiatry for left great toe diabetic foot ulcer management - He is on Eliquis which will need held for Aortogram - I discussed findings of duplex with Dr. Randie Heinz and with his new ulceration recommend Aortogram, Arteriogram of LLE with likely tibial atherectomy and/ or angioplasty. Will try to get this scheduled in near future  Graceann Congress, [facility] [phone]  Clinic MD:   Randie Heinz

## 2022-12-28 NOTE — Progress Notes (Signed)
Office Note     CC:  follow up Requesting Provider:  Lindwood Qua, MD  HPI: Paul Chen is a 82 y.o. (12/05/1940) male who presents for follow up of PAD. He has very extensive vascular surgery history as follows :  -Drug-coated balloon angioplasty left SFA , Balloon angioplasty of anterior tibial artery on left 05/01/2017 by Dr. Randie Heinz.   -Stenting of right SFA 05/15/2017 by Dr. Randie Heinz. -Jetstream atherectomy of right SFA in-stent occlusion and stenosis and Drug-coated balloon angioplasty of right SFA stents 01/15/2018 by Dr. Randie Heinz.  -Laser arthrectomy with 2.0 A[place]on right SFA and Drug-coated balloon angioplasty right SFA for in-stent stenosis 11/18/2019 by Dr. Randie Heinz  He has had significant neuropathy as well as bilateral lower extremity edema over the years. He has not tolerated compression stockings as he lives alone and is unable to don and doff the compression. He does elevate regularly which helps. He says today these things continue to be his biggest issues, especially his neuropathy. He has numbness, burning and tingling in his feet that bothers him all the time. He does take Gabapentin for this, which helps a little. He also reports continued swelling with tightness in his legs up to level of his knees. He uses cane to ambulate. He does not describe classic claudication symptoms or rest pain. He does report a ulcer on bottom of left big toe. This has been present for a couple months but he is not really sure. He does not know if it is getting better or worse. He says he is unable to really get to the toe to dress or clean it. He showers and says he just makes sure to put on clean socks daily. He also recently attempted to trim the toe nails on his right foot and clipped his nails a little too short so had a little bleeding from this. He is compliant with his Statin and Eliquis. He has a continuous glucose monitor and says that his numbers run between 70-180. He says since his last hospitalization  he has had trouble getting them well controlled. He reports previously they were 95% controlled based on his monitor but now he is around 87%.   The pt is on a statin for cholesterol management.    The pt is not on an aspirin. Other AC:  Eliquis The pt is on CCB, ACEI, BB, diuretic for hypertension.  The pt does  have diabetes. Tobacco hx:  former  Past Medical History:  Diagnosis Date   Arthritis    Chronic renal insufficiency    CKD (chronic kidney disease), stage III (HCC)    COPD (chronic obstructive pulmonary disease) (HCC)    with restriction   Coronary disease October 2010   Status post stenting of the proximal to mid right coronary with DES.  Status post extensive stenting of the proximal right coronary to the crux.  He has moderate disease in  the left coronary system   Depression    Diabetes mellitus    Insulin Dependent   Diastolic dysfunction    Congestive heart failure with   Dyspnea    multifactorial. chronic, 11/18/9- not an issue at this time   Gout    History of blood transfusion    History of diabetic ulcer of foot    right   Hypertension    Hypoventilation    syndrome   Obesity    Pneumonia    Pulmonary embolism [facility]) March 2010   hx of and DVT in March  of 2010 on chronic Coumadin therapy   Pulmonary hypertension (HCC)    Moderate to severe   Retroperitoneal bleed    Hx of right pelvic   Sleep apnea    Obstructive , no CPAP   Wears dentures     Past Surgical History:  Procedure Laterality Date   ABDOMINAL AORTOGRAM W/LOWER EXTREMITY Right 01/15/2018   Procedure: ABDOMINAL AORTOGRAM W/LOWER EXTREMITY;  Surgeon: Maeola Harman, MD;  Location: [facility] INVASIVE CV LAB;  Service: Cardiovascular;  Laterality: Right;   ABDOMINAL AORTOGRAM W/LOWER EXTREMITY Right 11/18/2019   Procedure: ABDOMINAL AORTOGRAM W/LOWER EXTREMITY;  Surgeon: Maeola Harman, MD;  Location: [facility] INVASIVE CV LAB;  Service: Cardiovascular;  Laterality: Right;    AMPUTATION TOE Left 05/04/2017   Procedure: Left hallux amputation;  Surgeon: Toni Arthurs, MD;  Location: [facility];  Service: Orthopedics;  Laterality: Left;   AMPUTATION TOE Right 12/21/2017   Procedure: Right hallux amputation;  Surgeon: Toni Arthurs, MD;  Location: [facility];  Service: Orthopedics;  Laterality: Right;    AMPUTATION TOE Right 02/15/2018   Procedure: Right 2nd toe amputation;  Surgeon: Toni Arthurs, MD;  Location: [facility] OR;  Service: Orthopedics;  Laterality: Right;  60   BACK SURGERY  2000   post   CARDIOVASCULAR STRESS TEST  11-14-2008   EF 44%   EXPLORATORY LAPAROTOMY  1972   POST   LOWER EXTREMITY ANGIOGRAPHY N/A 05/01/2017   Procedure: LOWER EXTREMITY ANGIOGRAPHY;  Surgeon: Maeola Harman, MD;  Location: [facility] INVASIVE CV LAB;  Service: Cardiovascular;  Laterality: N/A;   LOWER EXTREMITY ANGIOGRAPHY N/A 05/15/2017   Procedure: LOWER EXTREMITY ANGIOGRAPHY;  Surgeon: Maeola Harman, MD;  Location: [facility] INVASIVE CV LAB;  Service: Cardiovascular;  Laterality: N/A;   MULTIPLE TOOTH EXTRACTIONS     PERIPHERAL VASCULAR ATHERECTOMY  05/01/2017   Procedure: PERIPHERAL VASCULAR ATHERECTOMY;  Surgeon: Maeola Harman, MD;  Location: [facility] INVASIVE CV LAB;  Service: Cardiovascular;;  Left SFA   PERIPHERAL VASCULAR ATHERECTOMY Right 11/18/2019   Procedure: PERIPHERAL VASCULAR ATHERECTOMY;  Surgeon: Maeola Harman, MD;  Location: [facility] INVASIVE CV LAB;  Service: Cardiovascular;  Laterality: Right;  SFA   PERIPHERAL VASCULAR BALLOON ANGIOPLASTY  05/01/2017   Procedure: PERIPHERAL VASCULAR BALLOON ANGIOPLASTY;  Surgeon: Maeola Harman, MD;  Location: [facility] INVASIVE CV LAB;  Service: Cardiovascular;;  Left Anterial Tibial Artery   PERIPHERAL VASCULAR BALLOON ANGIOPLASTY Right 01/15/2018   Procedure: PERIPHERAL VASCULAR BALLOON ANGIOPLASTY;  Surgeon: Maeola Harman, MD;  Location: [facility] INVASIVE CV LAB;  Service:  Cardiovascular;  Laterality: Right;  SFA atherectomy   PERIPHERAL VASCULAR BALLOON ANGIOPLASTY Right 11/18/2019   Procedure: PERIPHERAL VASCULAR BALLOON ANGIOPLASTY;  Surgeon: Maeola Harman, MD;  Location: [facility] INVASIVE CV LAB;  Service: Cardiovascular;  Laterality: Right;  SFA   PERIPHERAL VASCULAR INTERVENTION Right 05/15/2017   Procedure: PERIPHERAL VASCULAR INTERVENTION;  Surgeon: Maeola Harman, MD;  Location: [facility] INVASIVE CV LAB;  Service: Cardiovascular;  Laterality: Right;  SFA Stent   US ECHOCARDIOGRAPHY  11-25-2008   EF 55-60%    Social History   Socioeconomic History   Marital status: Divorced    Spouse name: Not on file   Number of children: 3   Years of education: Not on file   Highest education level: Not on file  Occupational History   Occupation: disabled    Employer: RETIRED  Tobacco Use   Smoking status: Former    Current packs/day: 0.00    Types: Cigarettes  Start date: 01/31/1978    Quit date: 02/01/1980    Years since quitting: 42.9    Passive exposure: Never   Smokeless tobacco: Never  Vaping Use   Vaping status: Never Used  Substance and Sexual Activity   Alcohol use: Not Currently   Drug use: Never   Sexual activity: Not on file  Other Topics Concern   Not on file  Social History Narrative   Not on file   Social Determinants of Health   Financial Resource Strain: Low Risk  (02/15/2022)   Received from [facility]   Overall Financial Resource Strain (CARDIA)    Difficulty of Paying Living Expenses: Not hard at all  Food Insecurity: No Food Insecurity (02/15/2022)   Received from [facility]   Hunger Vital Sign    Worried About Running Out of Food in the Last Year: Never true    Ran Out of Food in the Last Year: Never true  Transportation Needs: No Transportation Needs (02/15/2022)   Received from [facility]   PRAPARE - Transportation    Lack of Transportation (Medical): No    Lack of Transportation (Non-Medical):  No  Physical Activity: Sufficiently Active (04/22/2022)   Received from [facility]   Exercise Vital Sign    Days of Exercise per Week: 3 days    Minutes of Exercise per Session: 60 min  Stress: No Stress Concern Present (04/22/2022)   Received from [facility] - Occupational Stress Questionnaire    Feeling of Stress : Only a little  Social Connections: Moderately Integrated (04/22/2022)   Received from [facility]   Social Connection and Isolation Panel [NHANES]    Frequency of Communication with Friends and Family: More than three times a week    Frequency of Social Gatherings with Friends and Family: More than three times a week    Attends Religious Services: More than 4 times per year    Active Member of [employer] or Organizations: Yes    Attends Engineer, structural: More than 4 times per year    Marital Status: Divorced  Catering manager Violence: Not At Risk (01/04/2021)   Received from [facility]   Humiliation, Afraid, Rape, and Kick questionnaire    Fear of Current or Ex-Partner: No    Emotionally Abused: No    Physically Abused: No    Sexually Abused: No   Family History  Problem Relation Age of Onset   Heart attack Mother    Coronary artery disease Mother    Hypertension Mother    Coronary artery disease Father    Heart attack Father     Current Outpatient Medications  Medication Sig Dispense Refill   acetaminophen (TYLENOL) 500 MG tablet Take 2 tablets (1,000 mg total) by mouth 3 (three) times daily.     albuterol (PROVENTIL) (2.5 MG/3ML) 0.083% nebulizer solution Take 2.5 mg by nebulization every 4 (four) hours as needed for wheezing or shortness of breath.      allopurinol (ZYLOPRIM) 300 MG tablet Take 300 mg by mouth daily.   11   amLODipine (NORVASC) 5 MG tablet Take 5 mg by mouth daily.     atorvastatin (LIPITOR) 40 MG tablet Take 1 tablet (40 mg total) by mouth daily. 90 tablet 3    busPIRone (BUSPAR) 15 MG tablet Take 15 mg by mouth 2 (two) times daily.     doxazosin (CARDURA) 4 MG  tablet Take 4 mg by mouth every evening.      DULoxetine (CYMBALTA) 30 MG capsule Take 30 mg by mouth daily.     DULoxetine (CYMBALTA) 60 MG capsule Take 60 mg by mouth daily.     ELIQUIS 5 MG TABS tablet Take 5 mg by mouth 2 (two) times daily.     gabapentin (NEURONTIN) 300 MG capsule Take 900 mg by mouth 3 (three) times daily.      glucagon 1 MG injection Inject 1 mg into the vein once as needed (for low blood sugars.).      insulin glargine (LANTUS) 100 unit/mL SOPN Inject 40 Units into the skin at bedtime.     insulin lispro (HUMALOG) 100 UNIT/ML KwikPen Inject 10 Units into the skin in the morning, at noon, and at bedtime.     ipratropium (ATROVENT) 0.02 % nebulizer solution Take 0.5 mg by nebulization every 4 (four) hours as needed for wheezing or shortness of breath.      lisinopril (ZESTRIL) 10 MG tablet Take 10 mg by mouth daily.      magnesium oxide (MAG-OX) 400 MG tablet Take 400 mg by mouth 2 (two) times daily.     metoprolol succinate (TOPROL-XL) 100 MG 24 hr tablet Take 150 mg by mouth daily.     mirtazapine (REMERON) 7.5 MG tablet Take 7.5 mg by mouth at bedtime.     Multiple Vitamin (MULTIVITAMIN WITH MINERALS) TABS tablet Take 1 tablet by mouth daily. ABC Complete Senior Multivitamin for 50+     nitroGLYCERIN (NITROSTAT) 0.4 MG SL tablet Place 0.4 mg under the tongue every 5 (five) minutes x 3 doses as needed for chest pain.      Omega-3 Fatty Acids (FISH OIL) 1000 MG CAPS Take 1,000 mg by mouth at bedtime.     OZEMPIC, 0.25 OR 0.5 MG/DOSE, 2 MG/1.5ML SOPN Inject 0.5 mg into the skin every Friday.     furosemide (LASIX) 20 MG tablet Take 20-40 mg by mouth in the morning and at bedtime.  (Patient not taking: Reported on 12/28/2022)     No current facility-administered medications for this visit.    No Known Allergies   REVIEW OF SYSTEMS:  [X]  denotes positive finding, [  ] denotes negative finding Cardiac  Comments:  Chest pain or chest pressure:    Shortness of breath upon exertion:    Short of breath when lying flat:    Irregular heart rhythm:        Vascular    Pain in calf, thigh, or hip brought on by ambulation:    Pain in feet at night that wakes you up from your sleep:     Blood clot in your veins:    Leg swelling:         Pulmonary    Oxygen at home:    Productive cough:     Wheezing:         Neurologic    Sudden weakness in arms or legs:     Sudden numbness in arms or legs:     Sudden onset of difficulty speaking or slurred speech:    Temporary loss of vision in one eye:     Problems with dizziness:         Gastrointestinal    Blood in stool:     Vomited blood:         Genitourinary    Burning when urinating:     Blood in urine:  Psychiatric    Major depression:         Hematologic    Bleeding problems:    Problems with blood clotting too easily:        Skin    Rashes or ulcers:        Constitutional    Fever or chills:      PHYSICAL EXAMINATION:  Vitals:   12/28/22 1127  BP: (!) 149/66  Pulse: (!) 59  Resp: 18  Temp: 98.5 F (36.9 C)  TempSrc: Temporal  SpO2: 96%  Weight: 246 lb 9.6 oz (111.9 kg)  Height: 5\' 9"  (1.753 m)    General:  WDWN in NAD; vital signs documented above Gait: Ambulates with cane HENT: WNL, normocephalic Pulmonary: normal non-labored breathing , without wheezing Cardiac: regular HR Abdomen: soft Vascular Exam/Pulses: 2+ femoral pulses, no palpable distal pulses. Doppler bilateral PT/ DP monophasic signals Extremities: without ischemic changes, without Gangrene , without cellulitis; with open wound on left plantar aspect of great toe     Musculoskeletal: no muscle wasting or atrophy  Neurologic: A&O X 3 Psychiatric:  The pt has Normal affect.   Non-Invasive Vascular Imaging:   +-------+-----------+-----------+------------+------------+  ABI/TBIToday's ABIToday's  TBIPrevious ABIPrevious TBI  +-------+-----------+-----------+------------+------------+  Right 0.89       -          0.76        -             +-------+-----------+-----------+------------+------------+  Left  0.77       -          0.62        -             +-------+-----------+-----------+------------+------------+   Bilateral ABIs appear increased. `  VAS [place] Lower extremity bypass graft Duplex: +-----------+--------+-----+---------------+--------------+--------+  RIGHT     PSV cm/sRatioStenosis       Waveform      Comments  +-----------+--------+-----+---------------+--------------+--------+  CFA Distal 152          30-49% stenosisbiphasic                +-----------+--------+-----+---------------+--------------+--------+  DFA       175          30-49% stenosisbiphasic                +-----------+--------+-----+---------------+--------------+--------+  SFA Prox   418          75-99% stenosisbiphasic                +-----------+--------+-----+---------------+--------------+--------+  SFA Mid    114                         biphasic                +-----------+--------+-----+---------------+--------------+--------+  SFA Distal 92                          biphasic                +-----------+--------+-----+---------------+--------------+--------+  POP Prox   88                          biphasic                +-----------+--------+-----+---------------+--------------+--------+  POP Mid    72  biphasic                +-----------+--------+-----+---------------+--------------+--------+  POP Distal 60                          biphasic                +-----------+--------+-----+---------------+--------------+--------+  ATA Distal 48                          biphasic                +-----------+--------+-----+---------------+--------------+--------+  PTA Distal 71                           biphasic                +-----------+--------+-----+---------------+--------------+--------+  PERO Distal23                          noise artifact          +-----------+--------+-----+---------------+--------------+--------+     Right Stent(s):  +---------------+--------+---------------+--------+----------+  Right SFA stentPSV cm/sStenosis       WaveformComments    +---------------+--------+---------------+--------+----------+  Prox to Stent  418     75-99%         biphasicSFA origin  +---------------+--------+---------------+--------+----------+  Proximal Stent 252     50-99% stenosisbiphasic            +---------------+--------+---------------+--------+----------+  Mid Stent      218     50-99% stenosisbiphasic            +---------------+--------+---------------+--------+----------+  Distal Stent   221     50-99% stenosisbiphasic            +---------------+--------+---------------+--------+----------+  Distal to Stent114                    biphasic            +---------------+--------+---------------+--------+----------+    +-----------+--------+-----+---------------+----------+--------+  LEFT      PSV cm/sRatioStenosis       Waveform  Comments  +-----------+--------+-----+---------------+----------+--------+  CFA Distal 157          30-49% stenosisbiphasic            +-----------+--------+-----+---------------+----------+--------+  DFA       159          30-49% stenosisbiphasic            +-----------+--------+-----+---------------+----------+--------+  SFA Prox   142                         triphasic           +-----------+--------+-----+---------------+----------+--------+  SFA Mid    112                         triphasic           +-----------+--------+-----+---------------+----------+--------+  SFA Distal 149                         triphasic            +-----------+--------+-----+---------------+----------+--------+  POP Prox   88                          biphasic            +-----------+--------+-----+---------------+----------+--------+  POP Mid    74                          biphasic            +-----------+--------+-----+---------------+----------+--------+  POP Distal 52                          monophasic          +-----------+--------+-----+---------------+----------+--------+  ATA Distal 182     3.5  50-74% stenosismonophasic          +-----------+--------+-----+---------------+----------+--------+  PTA Distal 57                          monophasic          +-----------+--------+-----+---------------+----------+--------+  PERO Distal40                          monophasic          +-----------+--------+-----+---------------+----------+--------+   Summary:  Right: 30-49% stenosis noted in the common femoral artery. 30-49% stenosis noted in the deep femoral artery. 75-99% stenosis noted in the superficial femoral artery. Right SFA stent 50-99% stenosis.   Left: 30-49% stenosis noted in the common femoral artery. 30-49% stenosis noted in the deep femoral artery. 50-74% stenosis noted in the anterior tibial artery.   ASSESSMENT/PLAN:: 82 y.o. male here for follow up of PAD. He has undergone multiple interventions on BLE. He is without any classic rest pain or claudication. He does have severe neuropathy, which causes him significant discomfort. He also has bilateral lower extremity edema that makes his legs feel very tight and heavy on ambulation. He has a new left great toe diabetic ulcer. There are no signs of infection in the toe. He has not been able to do any wound care on this as he cannot see the toe or get to it to dress it appropriately.  - ABIs are better than prior study 6 months ago - Duplex shows some areas of elevated velocities in the right SFA and SFA stent these are essentially  unchanged from his prior studies. On the LLE duplex shows some elevated velocities in the CF and also the AT. This is likely in area of prior intervention. - He has CKD at baseline. Most recent Scr was 2.3 in August. Will need to limit contrast or use CO2 for most of the Angiogram - Will try to get this arranged in the near future. In the mean time I am referring him to podiatry for left great toe diabetic foot ulcer management - He is on Eliquis which will need held for Aortogram - I discussed findings of duplex with Dr. Randie Heinz and with his new ulceration recommend Aortogram, Arteriogram of LLE with likely tibial atherectomy and/ or angioplasty. Will try to get this scheduled in near future  Graceann Congress, [facility] [phone]  Clinic MD:   Randie Heinz

## 2023-01-03 ENCOUNTER — Other Ambulatory Visit: Payer: Self-pay

## 2023-01-03 ENCOUNTER — Telehealth: Payer: Self-pay

## 2023-01-03 DIAGNOSIS — I7025 Atherosclerosis of native arteries of other extremities with ulceration: Secondary | ICD-10-CM

## 2023-01-03 NOTE — Telephone Encounter (Signed)
Attempted to reach patient to schedule aortogram. Left VM for patient to return call.  

## 2023-01-04 ENCOUNTER — Other Ambulatory Visit: Payer: Self-pay

## 2023-01-04 DIAGNOSIS — I7025 Atherosclerosis of native arteries of other extremities with ulceration: Secondary | ICD-10-CM

## 2023-01-16 ENCOUNTER — Other Ambulatory Visit: Payer: Self-pay

## 2023-01-16 ENCOUNTER — Ambulatory Visit (HOSPITAL_COMMUNITY)
Admission: RE | Admit: 2023-01-16 | Discharge: 2023-01-16 | Disposition: A | Discharge status: Home / Self Care | Payer: 59 | Attending: Vascular Surgery | Admitting: Vascular Surgery

## 2023-01-16 ENCOUNTER — Encounter (HOSPITAL_COMMUNITY)
Admission: RE | Disposition: A | Discharge status: Home / Self Care | Payer: Self-pay | Source: Home / Self Care | Attending: Vascular Surgery

## 2023-01-16 DIAGNOSIS — N183 Chronic kidney disease, stage 3 unspecified: Secondary | ICD-10-CM | POA: Diagnosis not present

## 2023-01-16 DIAGNOSIS — Z79899 Other long term (current) drug therapy: Secondary | ICD-10-CM | POA: Diagnosis not present

## 2023-01-16 DIAGNOSIS — Z7901 Long term (current) use of anticoagulants: Secondary | ICD-10-CM | POA: Diagnosis not present

## 2023-01-16 DIAGNOSIS — E11621 Type 2 diabetes mellitus with foot ulcer: Secondary | ICD-10-CM | POA: Insufficient documentation

## 2023-01-16 DIAGNOSIS — Z794 Long term (current) use of insulin: Secondary | ICD-10-CM | POA: Diagnosis not present

## 2023-01-16 DIAGNOSIS — Z7985 Long-term (current) use of injectable non-insulin antidiabetic drugs: Secondary | ICD-10-CM | POA: Insufficient documentation

## 2023-01-16 DIAGNOSIS — E1122 Type 2 diabetes mellitus with diabetic chronic kidney disease: Secondary | ICD-10-CM | POA: Diagnosis not present

## 2023-01-16 DIAGNOSIS — Z539 Procedure and treatment not carried out, unspecified reason: Secondary | ICD-10-CM | POA: Diagnosis not present

## 2023-01-16 DIAGNOSIS — E785 Hyperlipidemia, unspecified: Secondary | ICD-10-CM | POA: Insufficient documentation

## 2023-01-16 DIAGNOSIS — Z87891 Personal history of nicotine dependence: Secondary | ICD-10-CM | POA: Diagnosis not present

## 2023-01-16 DIAGNOSIS — I509 Heart failure, unspecified: Secondary | ICD-10-CM | POA: Diagnosis not present

## 2023-01-16 DIAGNOSIS — E1151 Type 2 diabetes mellitus with diabetic peripheral angiopathy without gangrene: Secondary | ICD-10-CM | POA: Insufficient documentation

## 2023-01-16 DIAGNOSIS — I130 Hypertensive heart and chronic kidney disease with heart failure and stage 1 through stage 4 chronic kidney disease, or unspecified chronic kidney disease: Secondary | ICD-10-CM | POA: Diagnosis not present

## 2023-01-16 DIAGNOSIS — E1140 Type 2 diabetes mellitus with diabetic neuropathy, unspecified: Secondary | ICD-10-CM | POA: Insufficient documentation

## 2023-01-16 DIAGNOSIS — I7025 Atherosclerosis of native arteries of other extremities with ulceration: Secondary | ICD-10-CM

## 2023-01-16 SURGERY — ABDOMINAL AORTOGRAM W/LOWER EXTREMITY
Anesthesia: LOCAL

## 2023-01-16 MED ORDER — SODIUM CHLORIDE 0.9 % IV SOLN: INTRAVENOUS | Status: DC

## 2023-01-16 NOTE — Interval H&P Note (Signed)
History and Physical Interval Note:  01/16/2023 7:47 AM  Paul Chen  has presented today for surgery, with the diagnosis of Atherosclerosis of native arteries of the extremities with ulceration.  The various methods of treatment have been discussed with the patient and family. After consideration of risks, benefits and other options for treatment, the patient has consented to  Procedure(s): ABDOMINAL AORTOGRAM W/LOWER EXTREMITY (N/A) as a surgical intervention.  The patient's history has been reviewed, patient examined, no change in status, stable for surgery.  I have reviewed the patient's chart and labs.  Questions were answered to the patient's satisfaction.     [name]

## 2023-01-26 ENCOUNTER — Ambulatory Visit: Payer: 59

## 2023-01-26 ENCOUNTER — Ambulatory Visit: Payer: 59 | Admitting: Podiatry

## 2023-01-26 DIAGNOSIS — M778 Other enthesopathies, not elsewhere classified: Secondary | ICD-10-CM

## 2023-01-26 DIAGNOSIS — E11621 Type 2 diabetes mellitus with foot ulcer: Secondary | ICD-10-CM | POA: Diagnosis not present

## 2023-01-26 DIAGNOSIS — L97522 Non-pressure chronic ulcer of other part of left foot with fat layer exposed: Secondary | ICD-10-CM | POA: Diagnosis not present

## 2023-01-26 NOTE — Progress Notes (Signed)
Chief Complaint  Patient presents with   Foot Ulcer    Diabetic ulscer on LT great. A1c 5.7, eliquis   HPI: 82 y.o. male presenting today for a concern of a left foot wound ulceration.  He had a partial amputation of his great toe in the past.  Over the past 6 to 8 months he has had a wound that he does not feel has changed size and does not recall any onset injury.  He states that his PCP has seen the area but he is not seeing any wound care for this.  He states that he does occasionally have a spot of drainage on his sock.  He notes that he has difficulty seeing the wound and applying type of dressing to the area himself.  He is also interested in diabetic shoes.  He notes that his shoes are very old and starting to breakdown.  Past Medical History:  Diagnosis Date   Arthritis    Chronic renal insufficiency    CKD (chronic kidney disease), stage III (HCC)    COPD (chronic obstructive pulmonary disease) (HCC)    with restriction   Coronary disease October 2010   Status post stenting of the proximal to mid right coronary with DES.  Status post extensive stenting of the proximal right coronary to the crux.  He has moderate disease in  the left coronary system   Depression    Diabetes mellitus    Insulin Dependent   Diastolic dysfunction    Congestive heart failure with   Dyspnea    multifactorial. chronic, 11/18/9- not an issue at this time   Gout    History of blood transfusion    History of diabetic ulcer of foot    right   Hypertension    Hypoventilation    syndrome   Obesity    Pneumonia    Pulmonary embolism [facility]) March 2010   hx of and DVT in March of 2010 on chronic Coumadin therapy   Pulmonary hypertension (HCC)    Moderate to severe   Retroperitoneal bleed    Hx of right pelvic   Sleep apnea    Obstructive , no CPAP   Wears dentures     Past Surgical History:  Procedure Laterality Date   ABDOMINAL AORTOGRAM W/LOWER EXTREMITY Right 01/15/2018   Procedure:  ABDOMINAL AORTOGRAM W/LOWER EXTREMITY;  Surgeon: Maeola Harman, MD;  Location: [facility] INVASIVE CV LAB;  Service: Cardiovascular;  Laterality: Right;   ABDOMINAL AORTOGRAM W/LOWER EXTREMITY Right 11/18/2019   Procedure: ABDOMINAL AORTOGRAM W/LOWER EXTREMITY;  Surgeon: Maeola Harman, MD;  Location: [facility] INVASIVE CV LAB;  Service: Cardiovascular;  Laterality: Right;   AMPUTATION TOE Left 05/04/2017   Procedure: Left hallux amputation;  Surgeon: Toni Arthurs, MD;  Location: [facility];  Service: Orthopedics;  Laterality: Left;   AMPUTATION TOE Right 12/21/2017   Procedure: Right hallux amputation;  Surgeon: Toni Arthurs, MD;  Location: [facility];  Service: Orthopedics;  Laterality: Right;    AMPUTATION TOE Right 02/15/2018   Procedure: Right 2nd toe amputation;  Surgeon: Toni Arthurs, MD;  Location: [facility] OR;  Service: Orthopedics;  Laterality: Right;  60   BACK SURGERY  2000   post   CARDIOVASCULAR STRESS TEST  11-14-2008   EF 44%   EXPLORATORY LAPAROTOMY  1972   POST   LOWER EXTREMITY ANGIOGRAPHY N/A 05/01/2017   Procedure: LOWER EXTREMITY ANGIOGRAPHY;  Surgeon: Maeola Harman, MD;  Location: [facility]  INVASIVE CV LAB;  Service: Cardiovascular;  Laterality: N/A;   LOWER EXTREMITY ANGIOGRAPHY N/A 05/15/2017   Procedure: LOWER EXTREMITY ANGIOGRAPHY;  Surgeon: Maeola Harman, MD;  Location: [facility] INVASIVE CV LAB;  Service: Cardiovascular;  Laterality: N/A;   MULTIPLE TOOTH EXTRACTIONS     PERIPHERAL VASCULAR ATHERECTOMY  05/01/2017   Procedure: PERIPHERAL VASCULAR ATHERECTOMY;  Surgeon: Maeola Harman, MD;  Location: [facility] INVASIVE CV LAB;  Service: Cardiovascular;;  Left SFA   PERIPHERAL VASCULAR ATHERECTOMY Right 11/18/2019   Procedure: PERIPHERAL VASCULAR ATHERECTOMY;  Surgeon: Maeola Harman, MD;  Location: [facility] INVASIVE CV LAB;  Service: Cardiovascular;  Laterality: Right;  SFA   PERIPHERAL VASCULAR BALLOON ANGIOPLASTY   05/01/2017   Procedure: PERIPHERAL VASCULAR BALLOON ANGIOPLASTY;  Surgeon: Maeola Harman, MD;  Location: [facility] INVASIVE CV LAB;  Service: Cardiovascular;;  Left Anterial Tibial Artery   PERIPHERAL VASCULAR BALLOON ANGIOPLASTY Right 01/15/2018   Procedure: PERIPHERAL VASCULAR BALLOON ANGIOPLASTY;  Surgeon: Maeola Harman, MD;  Location: [facility] INVASIVE CV LAB;  Service: Cardiovascular;  Laterality: Right;  SFA atherectomy   PERIPHERAL VASCULAR BALLOON ANGIOPLASTY Right 11/18/2019   Procedure: PERIPHERAL VASCULAR BALLOON ANGIOPLASTY;  Surgeon: Maeola Harman, MD;  Location: [facility] INVASIVE CV LAB;  Service: Cardiovascular;  Laterality: Right;  SFA   PERIPHERAL VASCULAR INTERVENTION Right 05/15/2017   Procedure: PERIPHERAL VASCULAR INTERVENTION;  Surgeon: Maeola Harman, MD;  Location: [facility] INVASIVE CV LAB;  Service: Cardiovascular;  Laterality: Right;  SFA Stent   US ECHOCARDIOGRAPHY  11-25-2008   EF 55-60%    No Known Allergies  Smoker?: former smoker, quit 40 years ago   PHYSICAL EXAM: General: The patient is alert and oriented x3 in no acute distress.  Dermatology: Skin is warm, dry and supple bilateral lower extremities. Interspaces are clear of maceration and debris.      Wound 1 description:  Location: Left plantar hallux IP joint    Depth: Full-thickness to subcutaneous tissue    Wound Border: Minimal hyperkeratosis    Wound Base: Granular with minimal serous drainage  Odor?:  none    Surrounding Tissue:  clear, no erythema    Infected?:  no    Necrosis?:  no    Pain?:  no    Tunneling:  none    Dimensions (cm):  1.0 x 0.8cm x 0.2cm  Vascular: Pedal pulses are diminished left foot  Neurological: Light touch sensation diminished left foot  Musculoskeletal Exam: There is a partial left hallux amputation at the level of the IP joint  RADIOGRAPHIC EXAM (left foot, 3 weightbearing views, 01/26/2023):  Normal osseous mineralization.  No evidence of  erosive changes or periosteal reaction to the proximal phalanx of the left hallux.  No gas seen within soft tissues.   ASSESSMENT / PLAN OF CARE: 1. 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 Capsulitis of left foot   3. Ulcer of left foot, with fat layer exposed (HCC)    The ulceration was sharply debrided of hyperkeratotic and devitalized soft tissue with sterile #312 blade to the level of subQ tissue .  Hemostasis obtained.  Prisma collagen trimmed out to size, and applied to wound base followed by antibiotic ointment and DSD applied.  Reviewed off-loading with patient.  His diabetic shoes are in poor condition, but he'd be a fall risk with a surgical shoe. Will get him set up for a diabetic shoe consult with our pedorthist, and an off-loading pad was applied under his current shoe insole to  offload the area.    Reviewed daily dressing changes with patient.  He was given the extra Prisma for home health care to use. He states he is unable to dress this wound himself and cannot see it himself.  Will order HHC for him.  Needs care 2x per week at home.   Discussed risks / concerns regarding ulcer with patient and possible sequelae if left untreated.  Stressed importance of infection prevention at home. Short-term goals are: prevent infection, off-load ulcer, heal ulcer Long-term goals are:  prevent recurrence, prevent amputation.   Return in about 2 weeks (around 02/09/2023) for f/u ulcer.   Clerance Lav, DPM, FACFAS [facility]     [address] [place], [place] [address]                Office [phone]  Fax [phone]

## 2023-01-27 ENCOUNTER — Telehealth: Payer: Self-pay

## 2023-01-27 NOTE — Telephone Encounter (Signed)
Spoke with patient to reschedule his aortogram that was previously canceled due to not stopping Eliquis as instructed. Offered a couple of dates to schedule procedure- but patient declined due to another appointment or too early. Patient agreed for our office to contact him back in January, to schedule for a later appointment when available.

## 2023-02-08 ENCOUNTER — Encounter (HOSPITAL_COMMUNITY): Payer: 59

## 2023-02-08 ENCOUNTER — Ambulatory Visit (INDEPENDENT_AMBULATORY_CARE_PROVIDER_SITE_OTHER): Payer: 59 | Admitting: Physician Assistant

## 2023-02-08 ENCOUNTER — Ambulatory Visit (HOSPITAL_COMMUNITY)
Admission: RE | Admit: 2023-02-08 | Discharge: 2023-02-08 | Disposition: A | Discharge status: Home / Self Care | Payer: 59 | Source: Ambulatory Visit | Attending: Vascular Surgery | Admitting: Vascular Surgery

## 2023-02-08 ENCOUNTER — Other Ambulatory Visit: Payer: Self-pay

## 2023-02-08 VITALS — BP 175/72 | HR 56 | Temp 98.0°F | Resp 20 | Ht 69.0 in | Wt 250.0 lb

## 2023-02-08 DIAGNOSIS — I7025 Atherosclerosis of native arteries of other extremities with ulceration: Secondary | ICD-10-CM | POA: Diagnosis present

## 2023-02-08 DIAGNOSIS — I872 Venous insufficiency (chronic) (peripheral): Secondary | ICD-10-CM | POA: Diagnosis not present

## 2023-02-08 LAB — VAS US ABI WITH/WO TBI
Left ABI: 0.85
Right ABI: 0.95

## 2023-02-08 NOTE — H&P (View-Only) (Signed)
Office Note     CC:  follow up Requesting Provider:  Lindwood Qua, MD  HPI: Paul Chen is a 83 y.o. ([birth year]) male who presents for follow up of PAD. He was recently seen at the end of November by myself and due to an ulcer on his left great toe I recommended scheduling him for an angiogram. However he did not hold his Eliquis appropriately so his procedure cancelled. We attempted to reschedule but due to the Holidays it did not work out and recommendation was to be re evaluated in our office. He is here today for that visit. He reports no changes since his last visit. His ulceration is unchanged. He says he is still challenged to get a dressing on it but he tries. He generally in the mornings after showering notices some bleeding from it but otherwise throughout day has had no bleeding issues. He has severe neuropathy so has no feeling in his feet.   He has very extensive vascular surgery history as follows :   -Drug-coated balloon angioplasty left SFA , Balloon angioplasty of anterior tibial artery on left 05/01/2017 by Dr. Randie Heinz.   -Stenting of right SFA 05/15/2017 by Dr. Randie Heinz. -Jetstream atherectomy of right SFA in-stent occlusion and stenosis and Drug-coated balloon angioplasty of right SFA stents 01/15/2018 by Dr. Randie Heinz.  -Laser arthrectomy with 2.0 Auryon right SFA and Drug-coated balloon angioplasty right SFA for in-stent stenosis 11/18/2019 by Dr. Randie Heinz  The pt is on a statin for cholesterol management.    The pt is not on an aspirin. Other AC:  Eliquis The pt is on CCB, ACEI, BB, diuretic for hypertension.  The pt does  have diabetes. Tobacco hx:  former  Past Medical History:  Diagnosis Date   Arthritis    Chronic renal insufficiency    CKD (chronic kidney disease), stage III (HCC)    COPD (chronic obstructive pulmonary disease) (HCC)    with restriction   Coronary disease October 2010   Status post stenting of the proximal to mid right coronary with DES.  Status post  extensive stenting of the proximal right coronary to the crux.  He has moderate disease in  the left coronary system   Depression    Diabetes mellitus    Insulin Dependent   Diastolic dysfunction    Congestive heart failure with   Dyspnea    multifactorial. chronic, 11/18/9- not an issue at this time   Gout    History of blood transfusion    History of diabetic ulcer of foot    right   Hypertension    Hypoventilation    syndrome   Obesity    Pneumonia    Pulmonary embolism [facility]) March 2010   hx of and DVT in March of 2010 on chronic Coumadin therapy   Pulmonary hypertension (HCC)    Moderate to severe   Retroperitoneal bleed    Hx of right pelvic   Sleep apnea    Obstructive , no CPAP   Wears dentures     Past Surgical History:  Procedure Laterality Date   ABDOMINAL AORTOGRAM W/LOWER EXTREMITY Right 01/15/2018   Procedure: ABDOMINAL AORTOGRAM W/LOWER EXTREMITY;  Surgeon: Maeola Harman, MD;  Location: [facility] INVASIVE CV LAB;  Service: Cardiovascular;  Laterality: Right;   ABDOMINAL AORTOGRAM W/LOWER EXTREMITY Right 11/18/2019   Procedure: ABDOMINAL AORTOGRAM W/LOWER EXTREMITY;  Surgeon: Maeola Harman, MD;  Location: [facility] INVASIVE CV LAB;  Service: Cardiovascular;  Laterality: Right;   AMPUTATION TOE Left 05/04/2017  Procedure: Left hallux amputation;  Surgeon: Toni Arthurs, MD;  Location: [facility];  Service: Orthopedics;  Laterality: Left;   AMPUTATION TOE Right 12/21/2017   Procedure: Right hallux amputation;  Surgeon: Toni Arthurs, MD;  Location: [facility];  Service: Orthopedics;  Laterality: Right;    AMPUTATION TOE Right 02/15/2018   Procedure: Right 2nd toe amputation;  Surgeon: Toni Arthurs, MD;  Location: [facility] OR;  Service: Orthopedics;  Laterality: Right;  60   BACK SURGERY  2000   post   CARDIOVASCULAR STRESS TEST  11-14-2008   EF 44%   EXPLORATORY LAPAROTOMY  1972   POST   LOWER EXTREMITY ANGIOGRAPHY N/A  05/01/2017   Procedure: LOWER EXTREMITY ANGIOGRAPHY;  Surgeon: Maeola Harman, MD;  Location: [facility] INVASIVE CV LAB;  Service: Cardiovascular;  Laterality: N/A;   LOWER EXTREMITY ANGIOGRAPHY N/A 05/15/2017   Procedure: LOWER EXTREMITY ANGIOGRAPHY;  Surgeon: Maeola Harman, MD;  Location: [facility] INVASIVE CV LAB;  Service: Cardiovascular;  Laterality: N/A;   MULTIPLE TOOTH EXTRACTIONS     PERIPHERAL VASCULAR ATHERECTOMY  05/01/2017   Procedure: PERIPHERAL VASCULAR ATHERECTOMY;  Surgeon: Maeola Harman, MD;  Location: [facility] INVASIVE CV LAB;  Service: Cardiovascular;;  Left SFA   PERIPHERAL VASCULAR ATHERECTOMY Right 11/18/2019   Procedure: PERIPHERAL VASCULAR ATHERECTOMY;  Surgeon: Maeola Harman, MD;  Location: [facility] INVASIVE CV LAB;  Service: Cardiovascular;  Laterality: Right;  SFA   PERIPHERAL VASCULAR BALLOON ANGIOPLASTY  05/01/2017   Procedure: PERIPHERAL VASCULAR BALLOON ANGIOPLASTY;  Surgeon: Maeola Harman, MD;  Location: [facility] INVASIVE CV LAB;  Service: Cardiovascular;;  Left Anterial Tibial Artery   PERIPHERAL VASCULAR BALLOON ANGIOPLASTY Right 01/15/2018   Procedure: PERIPHERAL VASCULAR BALLOON ANGIOPLASTY;  Surgeon: Maeola Harman, MD;  Location: [facility] INVASIVE CV LAB;  Service: Cardiovascular;  Laterality: Right;  SFA atherectomy   PERIPHERAL VASCULAR BALLOON ANGIOPLASTY Right 11/18/2019   Procedure: PERIPHERAL VASCULAR BALLOON ANGIOPLASTY;  Surgeon: Maeola Harman, MD;  Location: [facility] INVASIVE CV LAB;  Service: Cardiovascular;  Laterality: Right;  SFA   PERIPHERAL VASCULAR INTERVENTION Right 05/15/2017   Procedure: PERIPHERAL VASCULAR INTERVENTION;  Surgeon: Maeola Harman, MD;  Location: [facility] INVASIVE CV LAB;  Service: Cardiovascular;  Laterality: Right;  SFA Stent   US ECHOCARDIOGRAPHY  11-25-2008   EF 55-60%    Social History   Socioeconomic History   Marital status: Divorced    Spouse name: Not on file   Number of  children: 3   Years of education: Not on file   Highest education level: Not on file  Occupational History   Occupation: disabled    Employer: RETIRED  Tobacco Use   Smoking status: Former    Current packs/day: 0.00    Types: Cigarettes    Start date: 01/31/1978    Quit date: 02/01/1980    Years since quitting: 43.0    Passive exposure: Never   Smokeless tobacco: Never  Vaping Use   Vaping status: Never Used  Substance and Sexual Activity   Alcohol use: Not Currently   Drug use: Never   Sexual activity: Not on file  Other Topics Concern   Not on file  Social History Narrative   Not on file   Social Drivers of Health   Financial Resource Strain: Low Risk  (02/15/2022)   Received from [facility]   Overall Financial Resource Strain (CARDIA)    Difficulty of Paying Living Expenses: Not hard at all  Food Insecurity: No Food Insecurity (01/02/2023)  Received from [facility]   Hunger Vital Sign    Worried About Running Out of Food in the Last Year: Never true    Ran Out of Food in the Last Year: Never true  Transportation Needs: No Transportation Needs (01/02/2023)   Received from [facility] - Transportation    Lack of Transportation (Medical): No    Lack of Transportation (Non-Medical): No  Physical Activity: Sufficiently Active (04/22/2022)   Received from [facility]   Exercise Vital Sign    Days of Exercise per Week: 3 days    Minutes of Exercise per Session: 60 min  Stress: No Stress Concern Present (04/22/2022)   Received from [facility] - Occupational Stress Questionnaire    Feeling of Stress : Only a little  Social Connections: Moderately Integrated (04/22/2022)   Received from [facility]   Social Connection and Isolation Panel [NHANES]    Frequency of Communication with Friends and Family: More than three times a week    Frequency of Social Gatherings with Friends and Family: More than  three times a week    Attends Religious Services: More than 4 times per year    Active Member of [employer] or Organizations: Yes    Attends Engineer, structural: More than 4 times per year    Marital Status: Divorced  Intimate Partner Violence: Not At Risk (01/02/2023)   [facility]   Humiliation, Afraid, Rape, and Kick questionnaire    Fear of Current or Ex-Partner: No    Emotionally Abused: No    Physically Abused: No    Sexually Abused: No    Family History  Problem Relation Age of Onset   Heart attack Mother    Coronary artery disease Mother    Hypertension Mother    Coronary artery disease Father    Heart attack Father     Current Outpatient Medications  Medication Sig Dispense Refill   albuterol (PROVENTIL) (2.5 MG/3ML) 0.083% nebulizer solution Take 2.5 mg by nebulization every 4 (four) hours as needed for wheezing or shortness of breath.      albuterol (VENTOLIN HFA) 108 (90 Base) MCG/ACT inhaler Inhale 1-2 puffs into the lungs every 6 (six) hours as needed for wheezing or shortness of breath.     allopurinol (ZYLOPRIM) 100 MG tablet Take 100 mg by mouth daily.     amLODipine (NORVASC) 5 MG tablet Take 5 mg by mouth daily.     apixaban (ELIQUIS) 2.5 MG TABS tablet Take 2.5 mg by mouth 2 (two) times daily.     atorvastatin (LIPITOR) 40 MG tablet Take 1 tablet (40 mg total) by mouth daily. 90 tablet 3   chlorthalidone (HYGROTON) 25 MG tablet Take 25 mg by mouth daily.     dronedarone (MULTAQ) 400 MG tablet Take 400 mg by mouth 2 (two) times daily.     DULoxetine (CYMBALTA) 30 MG capsule Take 30 mg by mouth daily.     empagliflozin (JARDIANCE) 10 MG TABS tablet Take 10 mg by mouth daily.     insulin lispro (HUMALOG) 100 UNIT/ML KwikPen Inject into the skin. Uses in insulin pump VGO 20     metoprolol tartrate (LOPRESSOR) 25 MG tablet Take 25 mg by mouth 2 (two) times daily.     mirtazapine (REMERON) 45 MG tablet Take 45 mg by mouth at bedtime.      Multiple Vitamin (MULTIVITAMIN WITH MINERALS)  TABS tablet Take 1 tablet by mouth daily. ABC Complete Senior Multivitamin for 50+     nitroGLYCERIN (NITROSTAT) 0.4 MG SL tablet Place 0.4 mg under the tongue every 5 (five) minutes x 3 doses as needed for chest pain.      oxyCODONE-acetaminophen (PERCOCET) 10-325 MG tablet Take 1 tablet by mouth 4 (four) times daily as needed for pain.     OZEMPIC, 0.25 OR 0.5 MG/DOSE, 2 MG/1.5ML SOPN Inject 0.5 mg into the skin every Saturday.     pregabalin (LYRICA) 50 MG capsule Take 50 mg by mouth 3 (three) times daily.     tamsulosin (FLOMAX) 0.4 MG CAPS capsule Take 0.4 mg by mouth 2 (two) times daily.     No current facility-administered medications for this visit.    No Known Allergies   REVIEW OF SYSTEMS:  [X]  denotes positive finding, [ ]  denotes negative finding Cardiac  Comments:  Chest pain or chest pressure:    Shortness of breath upon exertion:    Short of breath when lying flat:    Irregular heart rhythm:        Vascular    Pain in calf, thigh, or hip brought on by ambulation:    Pain in feet at night that wakes you up from your sleep:     Blood clot in your veins:    Leg swelling:         Pulmonary    Oxygen at home:    Productive cough:     Wheezing:         Neurologic    Sudden weakness in arms or legs:     Sudden numbness in arms or legs:     Sudden onset of difficulty speaking or slurred speech:    Temporary loss of vision in one eye:     Problems with dizziness:         Gastrointestinal    Blood in stool:     Vomited blood:         Genitourinary    Burning when urinating:     Blood in urine:        Psychiatric    Major depression:         Hematologic    Bleeding problems:    Problems with blood clotting too easily:        Skin    Rashes or ulcers:        Constitutional    Fever or chills:      PHYSICAL EXAMINATION:  Vitals:   02/08/23 1438  BP: (!) 175/72  Pulse: (!) 56  Resp: 20  Temp: 98 F  (36.7 C)  TempSrc: Temporal  SpO2: 90%  Weight: 250 lb (113.4 kg)  Height: 5\' 9"  (1.753 m)    General:  WDWN in NAD; vital signs documented above Gait: ambulates with cane HENT: WNL, normocephalic Pulmonary: normal non-labored breathing Cardiac: regular HR Vascular Exam/Pulses: 2+ femoral, Doppler bilateral PT and Dp monophasic signals Extremities: without ischemic changes, without Gangrene , without cellulitis; with open wound on right 2nd toe, dry eschar and left great toe. Slightly large in size than previous visit      Musculoskeletal: no muscle wasting or atrophy  Neurologic: A&O X 3 Psychiatric:  The pt has Normal affect.   Non-Invasive Vascular Imaging:   +-------+-----------+-----------+------------+------------+  ABI/TBIToday's ABIToday's TBIPrevious ABIPrevious TBI  +-------+-----------+-----------+------------+------------+  Right 0.95                  0.89                      +-------+-----------+-----------+------------+------------+  Left  0.85       0.74       0.77                      +-------+-----------+-----------+------------+------------+    ASSESSMENT/PLAN:: 83 y.o. male here for follow up for follow up wound check and re evaluation for Angiogram. His left great toe wound is slightly larger than his last visit in November. Overall essentially unchanged but certainly not improving. His ABI today is essentially unchanged as well. Prior duplex showed some areas of elevated velocities in the right SFA and SFA stent these are essentially unchanged from his prior studies. On the LLE duplex shows some elevated velocities in the CF and also the AT. This is likely in area of prior intervention.  - He has CKD at baseline. Most recent Scr was 2.3 in August. Will need to limit contrast or use CO2 for most of the Angiogram  - Will need to hold his Eliquis for procedure - Will reschedule his Aortogram, arteriogram with possible tibial intervention in  the near future with Dr. Biagio Quint, PA-C [facility] [phone]  Clinic MD:   Randie Heinz

## 2023-02-08 NOTE — Progress Notes (Signed)
 Office Note     CC:  follow up Requesting Provider:  Lindwood Qua, MD  HPI: Paul Chen is a 83 y.o. ([birth year]) male who presents for follow up of PAD. He was recently seen at the end of November by myself and due to an ulcer on his left great toe I recommended scheduling him for an angiogram. However he did not hold his Eliquis appropriately so his procedure cancelled. We attempted to reschedule but due to the Holidays it did not work out and recommendation was to be re evaluated in our office. He is here today for that visit. He reports no changes since his last visit. His ulceration is unchanged. He says he is still challenged to get a dressing on it but he tries. He generally in the mornings after showering notices some bleeding from it but otherwise throughout day has had no bleeding issues. He has severe neuropathy so has no feeling in his feet.   He has very extensive vascular surgery history as follows :   -Drug-coated balloon angioplasty left SFA , Balloon angioplasty of anterior tibial artery on left 05/01/2017 by Dr. Randie Heinz.   -Stenting of right SFA 05/15/2017 by Dr. Randie Heinz. -Jetstream atherectomy of right SFA in-stent occlusion and stenosis and Drug-coated balloon angioplasty of right SFA stents 01/15/2018 by Dr. Randie Heinz.  -Laser arthrectomy with 2.0 Auryon right SFA and Drug-coated balloon angioplasty right SFA for in-stent stenosis 11/18/2019 by Dr. Randie Heinz  The pt is on a statin for cholesterol management.    The pt is not on an aspirin. Other AC:  Eliquis The pt is on CCB, ACEI, BB, diuretic for hypertension.  The pt does  have diabetes. Tobacco hx:  former  Past Medical History:  Diagnosis Date   Arthritis    Chronic renal insufficiency    CKD (chronic kidney disease), stage III (HCC)    COPD (chronic obstructive pulmonary disease) (HCC)    with restriction   Coronary disease October 2010   Status post stenting of the proximal to mid right coronary with DES.  Status post  extensive stenting of the proximal right coronary to the crux.  He has moderate disease in  the left coronary system   Depression    Diabetes mellitus    Insulin Dependent   Diastolic dysfunction    Congestive heart failure with   Dyspnea    multifactorial. chronic, 11/18/9- not an issue at this time   Gout    History of blood transfusion    History of diabetic ulcer of foot    right   Hypertension    Hypoventilation    syndrome   Obesity    Pneumonia    Pulmonary embolism [facility]) March 2010   hx of and DVT in March of 2010 on chronic Coumadin therapy   Pulmonary hypertension (HCC)    Moderate to severe   Retroperitoneal bleed    Hx of right pelvic   Sleep apnea    Obstructive , no CPAP   Wears dentures     Past Surgical History:  Procedure Laterality Date   ABDOMINAL AORTOGRAM W/LOWER EXTREMITY Right 01/15/2018   Procedure: ABDOMINAL AORTOGRAM W/LOWER EXTREMITY;  Surgeon: Maeola Harman, MD;  Location: [facility] INVASIVE CV LAB;  Service: Cardiovascular;  Laterality: Right;   ABDOMINAL AORTOGRAM W/LOWER EXTREMITY Right 11/18/2019   Procedure: ABDOMINAL AORTOGRAM W/LOWER EXTREMITY;  Surgeon: Maeola Harman, MD;  Location: [facility] INVASIVE CV LAB;  Service: Cardiovascular;  Laterality: Right;   AMPUTATION TOE Left 05/04/2017  Procedure: Left hallux amputation;  Surgeon: Toni Arthurs, MD;  Location: [facility];  Service: Orthopedics;  Laterality: Left;   AMPUTATION TOE Right 12/21/2017   Procedure: Right hallux amputation;  Surgeon: Toni Arthurs, MD;  Location: [facility];  Service: Orthopedics;  Laterality: Right;    AMPUTATION TOE Right 02/15/2018   Procedure: Right 2nd toe amputation;  Surgeon: Toni Arthurs, MD;  Location: [facility] OR;  Service: Orthopedics;  Laterality: Right;  62   BACK SURGERY  2000   post   CARDIOVASCULAR STRESS TEST  11-14-2008   EF 44%   EXPLORATORY LAPAROTOMY  1972   POST   LOWER EXTREMITY ANGIOGRAPHY N/A  05/01/2017   Procedure: LOWER EXTREMITY ANGIOGRAPHY;  Surgeon: Maeola Harman, MD;  Location: [facility] INVASIVE CV LAB;  Service: Cardiovascular;  Laterality: N/A;   LOWER EXTREMITY ANGIOGRAPHY N/A 05/15/2017   Procedure: LOWER EXTREMITY ANGIOGRAPHY;  Surgeon: Maeola Harman, MD;  Location: [facility] INVASIVE CV LAB;  Service: Cardiovascular;  Laterality: N/A;   MULTIPLE TOOTH EXTRACTIONS     PERIPHERAL VASCULAR ATHERECTOMY  05/01/2017   Procedure: PERIPHERAL VASCULAR ATHERECTOMY;  Surgeon: Maeola Harman, MD;  Location: [facility] INVASIVE CV LAB;  Service: Cardiovascular;;  Left SFA   PERIPHERAL VASCULAR ATHERECTOMY Right 11/18/2019   Procedure: PERIPHERAL VASCULAR ATHERECTOMY;  Surgeon: Maeola Harman, MD;  Location: [facility] INVASIVE CV LAB;  Service: Cardiovascular;  Laterality: Right;  SFA   PERIPHERAL VASCULAR BALLOON ANGIOPLASTY  05/01/2017   Procedure: PERIPHERAL VASCULAR BALLOON ANGIOPLASTY;  Surgeon: Maeola Harman, MD;  Location: [facility] INVASIVE CV LAB;  Service: Cardiovascular;;  Left Anterial Tibial Artery   PERIPHERAL VASCULAR BALLOON ANGIOPLASTY Right 01/15/2018   Procedure: PERIPHERAL VASCULAR BALLOON ANGIOPLASTY;  Surgeon: Maeola Harman, MD;  Location: [facility] INVASIVE CV LAB;  Service: Cardiovascular;  Laterality: Right;  SFA atherectomy   PERIPHERAL VASCULAR BALLOON ANGIOPLASTY Right 11/18/2019   Procedure: PERIPHERAL VASCULAR BALLOON ANGIOPLASTY;  Surgeon: Maeola Harman, MD;  Location: [facility] INVASIVE CV LAB;  Service: Cardiovascular;  Laterality: Right;  SFA   PERIPHERAL VASCULAR INTERVENTION Right 05/15/2017   Procedure: PERIPHERAL VASCULAR INTERVENTION;  Surgeon: Maeola Harman, MD;  Location: [facility] INVASIVE CV LAB;  Service: Cardiovascular;  Laterality: Right;  SFA Stent   US ECHOCARDIOGRAPHY  11-25-2008   EF 55-60%    Social History   Socioeconomic History   Marital status: Divorced    Spouse name: Not on file   Number of  children: 3   Years of education: Not on file   Highest education level: Not on file  Occupational History   Occupation: disabled    Employer: RETIRED  Tobacco Use   Smoking status: Former    Current packs/day: 0.00    Types: Cigarettes    Start date: 01/31/1978    Quit date: 02/01/1980    Years since quitting: 43.0    Passive exposure: Never   Smokeless tobacco: Never  Vaping Use   Vaping status: Never Used  Substance and Sexual Activity   Alcohol use: Not Currently   Drug use: Never   Sexual activity: Not on file  Other Topics Concern   Not on file  Social History Narrative   Not on file   Social Drivers of Health   Financial Resource Strain: Low Risk  (02/15/2022)   Received from [facility]   Overall Financial Resource Strain (CARDIA)    Difficulty of Paying Living Expenses: Not hard at all  Food Insecurity: No Food Insecurity (01/02/2023)  Received from [facility]   Hunger Vital Sign    Worried About Running Out of Food in the Last Year: Never true    Ran Out of Food in the Last Year: Never true  Transportation Needs: No Transportation Needs (01/02/2023)   Received from [facility] - Transportation    Lack of Transportation (Medical): No    Lack of Transportation (Non-Medical): No  Physical Activity: Sufficiently Active (04/22/2022)   Received from [facility]   Exercise Vital Sign    Days of Exercise per Week: 3 days    Minutes of Exercise per Session: 60 min  Stress: No Stress Concern Present (04/22/2022)   Received from [facility] - Occupational Stress Questionnaire    Feeling of Stress : Only a little  Social Connections: Moderately Integrated (04/22/2022)   Received from [facility]   Social Connection and Isolation Panel [NHANES]    Frequency of Communication with Friends and Family: More than three times a week    Frequency of Social Gatherings with Friends and Family: More than  three times a week    Attends Religious Services: More than 4 times per year    Active Member of [employer] or Organizations: Yes    Attends Engineer, structural: More than 4 times per year    Marital Status: Divorced  Intimate Partner Violence: Not At Risk (01/02/2023)   [facility]   Humiliation, Afraid, Rape, and Kick questionnaire    Fear of Current or Ex-Partner: No    Emotionally Abused: No    Physically Abused: No    Sexually Abused: No    Family History  Problem Relation Age of Onset   Heart attack Mother    Coronary artery disease Mother    Hypertension Mother    Coronary artery disease Father    Heart attack Father     Current Outpatient Medications  Medication Sig Dispense Refill   albuterol (PROVENTIL) (2.5 MG/3ML) 0.083% nebulizer solution Take 2.5 mg by nebulization every 4 (four) hours as needed for wheezing or shortness of breath.      albuterol (VENTOLIN HFA) 108 (90 Base) MCG/ACT inhaler Inhale 1-2 puffs into the lungs every 6 (six) hours as needed for wheezing or shortness of breath.     allopurinol (ZYLOPRIM) 100 MG tablet Take 100 mg by mouth daily.     amLODipine (NORVASC) 5 MG tablet Take 5 mg by mouth daily.     apixaban (ELIQUIS) 2.5 MG TABS tablet Take 2.5 mg by mouth 2 (two) times daily.     atorvastatin (LIPITOR) 40 MG tablet Take 1 tablet (40 mg total) by mouth daily. 90 tablet 3   chlorthalidone (HYGROTON) 25 MG tablet Take 25 mg by mouth daily.     dronedarone (MULTAQ) 400 MG tablet Take 400 mg by mouth 2 (two) times daily.     DULoxetine (CYMBALTA) 30 MG capsule Take 30 mg by mouth daily.     empagliflozin (JARDIANCE) 10 MG TABS tablet Take 10 mg by mouth daily.     insulin lispro (HUMALOG) 100 UNIT/ML KwikPen Inject into the skin. Uses in insulin pump VGO 20     metoprolol tartrate (LOPRESSOR) 25 MG tablet Take 25 mg by mouth 2 (two) times daily.     mirtazapine (REMERON) 45 MG tablet Take 45 mg by mouth at bedtime.      Multiple Vitamin (MULTIVITAMIN WITH MINERALS)  TABS tablet Take 1 tablet by mouth daily. ABC Complete Senior Multivitamin for 50+     nitroGLYCERIN (NITROSTAT) 0.4 MG SL tablet Place 0.4 mg under the tongue every 5 (five) minutes x 3 doses as needed for chest pain.      oxyCODONE-acetaminophen (PERCOCET) 10-325 MG tablet Take 1 tablet by mouth 4 (four) times daily as needed for pain.     OZEMPIC, 0.25 OR 0.5 MG/DOSE, 2 MG/1.5ML SOPN Inject 0.5 mg into the skin every Saturday.     pregabalin (LYRICA) 50 MG capsule Take 50 mg by mouth 3 (three) times daily.     tamsulosin (FLOMAX) 0.4 MG CAPS capsule Take 0.4 mg by mouth 2 (two) times daily.     No current facility-administered medications for this visit.    No Known Allergies   REVIEW OF SYSTEMS:  [X]  denotes positive finding, [ ]  denotes negative finding Cardiac  Comments:  Chest pain or chest pressure:    Shortness of breath upon exertion:    Short of breath when lying flat:    Irregular heart rhythm:        Vascular    Pain in calf, thigh, or hip brought on by ambulation:    Pain in feet at night that wakes you up from your sleep:     Blood clot in your veins:    Leg swelling:         Pulmonary    Oxygen at home:    Productive cough:     Wheezing:         Neurologic    Sudden weakness in arms or legs:     Sudden numbness in arms or legs:     Sudden onset of difficulty speaking or slurred speech:    Temporary loss of vision in one eye:     Problems with dizziness:         Gastrointestinal    Blood in stool:     Vomited blood:         Genitourinary    Burning when urinating:     Blood in urine:        Psychiatric    Major depression:         Hematologic    Bleeding problems:    Problems with blood clotting too easily:        Skin    Rashes or ulcers:        Constitutional    Fever or chills:      PHYSICAL EXAMINATION:  Vitals:   02/08/23 1438  BP: (!) 175/72  Pulse: (!) 56  Resp: 20  Temp: 98 F  (36.7 C)  TempSrc: Temporal  SpO2: 90%  Weight: 250 lb (113.4 kg)  Height: 5\' 9"  (1.753 m)    General:  WDWN in NAD; vital signs documented above Gait: ambulates with cane HENT: WNL, normocephalic Pulmonary: normal non-labored breathing Cardiac: regular HR Vascular Exam/Pulses: 2+ femoral, Doppler bilateral PT and Dp monophasic signals Extremities: without ischemic changes, without Gangrene , without cellulitis; with open wound on right 2nd toe, dry eschar and left great toe. Slightly large in size than previous visit      Musculoskeletal: no muscle wasting or atrophy  Neurologic: A&O X 3 Psychiatric:  The pt has Normal affect.   Non-Invasive Vascular Imaging:   +-------+-----------+-----------+------------+------------+  ABI/TBIToday's ABIToday's TBIPrevious ABIPrevious TBI  +-------+-----------+-----------+------------+------------+  Right 0.95                  0.89                      +-------+-----------+-----------+------------+------------+  Left  0.85       0.74       0.77                      +-------+-----------+-----------+------------+------------+    ASSESSMENT/PLAN:: 83 y.o. male here for follow up for follow up wound check and re evaluation for Angiogram. His left great toe wound is slightly larger than his last visit in November. Overall essentially unchanged but certainly not improving. His ABI today is essentially unchanged as well. Prior duplex showed some areas of elevated velocities in the right SFA and SFA stent these are essentially unchanged from his prior studies. On the LLE duplex shows some elevated velocities in the CF and also the AT. This is likely in area of prior intervention.  - He has CKD at baseline. Most recent Scr was 2.3 in August. Will need to limit contrast or use CO2 for most of the Angiogram  - Will need to hold his Eliquis for procedure - Will reschedule his Aortogram, arteriogram with possible tibial intervention in  the near future with Dr. Biagio Quint, PA-C [facility] [phone]  Clinic MD:   Randie Heinz

## 2023-02-09 ENCOUNTER — Ambulatory Visit (INDEPENDENT_AMBULATORY_CARE_PROVIDER_SITE_OTHER): Payer: 59 | Admitting: Podiatry

## 2023-02-09 ENCOUNTER — Encounter: Payer: Self-pay | Admitting: Physician Assistant

## 2023-02-09 DIAGNOSIS — L97521 Non-pressure chronic ulcer of other part of left foot limited to breakdown of skin: Secondary | ICD-10-CM

## 2023-02-09 DIAGNOSIS — E11621 Type 2 diabetes mellitus with foot ulcer: Secondary | ICD-10-CM

## 2023-02-09 NOTE — Progress Notes (Signed)
 Chief Complaint  Patient presents with   Foot Ulcer    LT 1st hallux. Ulcer in bottom surface of toe. Dressing changes every day. Wound care nurse 2x weekly. Toe was red yesterday but has improved today. No abnormal drainage, no pain due to neuropathy, no bleeding.    HPI: 83 y.o. male presenting today for follow-up of left hallux ulcer to the plantar aspect of the toe.  Home health nursing is coming out typically on Mondays and Fridays to change his dressing.  He states that it is looking better.  He has been wearing his diabetic shoes with the extra offloading pad underneath the insole that was placed on his last visit.  Past Medical History:  Diagnosis Date   Arthritis    Chronic renal insufficiency    CKD (chronic kidney disease), stage III (HCC)    COPD (chronic obstructive pulmonary disease) (HCC)    with restriction   Coronary disease October 2010   Status post stenting of the proximal to mid right coronary with DES.  Status post extensive stenting of the proximal right coronary to the crux.  He has moderate disease in  the left coronary system   Depression    Diabetes mellitus    Insulin  Dependent   Diastolic dysfunction    Congestive heart failure with   Dyspnea    multifactorial. chronic, 11/18/9- not an issue at this time   Gout    History of blood transfusion    History of diabetic ulcer of foot    right   Hypertension    Hypoventilation    syndrome   Obesity    Pneumonia    Pulmonary embolism [facility]) March 2010   hx of and DVT in March of 2010 on chronic Coumadin  therapy   Pulmonary hypertension (HCC)    Moderate to severe   Retroperitoneal bleed    Hx of right pelvic   Sleep apnea    Obstructive , no CPAP   Wears dentures     Past Surgical History:  Procedure Laterality Date   ABDOMINAL AORTOGRAM W/LOWER EXTREMITY Right 01/15/2018   Procedure: ABDOMINAL AORTOGRAM W/LOWER EXTREMITY;  Surgeon: Sheree Penne Bruckner, MD;  Location: [facility] INVASIVE CV LAB;   Service: Cardiovascular;  Laterality: Right;   ABDOMINAL AORTOGRAM W/LOWER EXTREMITY Right 11/18/2019   Procedure: ABDOMINAL AORTOGRAM W/LOWER EXTREMITY;  Surgeon: Sheree Penne Bruckner, MD;  Location: [facility] INVASIVE CV LAB;  Service: Cardiovascular;  Laterality: Right;   AMPUTATION TOE Left 05/04/2017   Procedure: Left hallux amputation;  Surgeon: Kit Rush, MD;  Location: [facility];  Service: Orthopedics;  Laterality: Left;   AMPUTATION TOE Right 12/21/2017   Procedure: Right hallux amputation;  Surgeon: Kit Rush, MD;  Location: [facility];  Service: Orthopedics;  Laterality: Right;    AMPUTATION TOE Right 02/15/2018   Procedure: Right 2nd toe amputation;  Surgeon: Kit Rush, MD;  Location: [facility] OR;  Service: Orthopedics;  Laterality: Right;  60   BACK SURGERY  2000   post   CARDIOVASCULAR STRESS TEST  11-14-2008   EF 44%   EXPLORATORY LAPAROTOMY  1972   POST   LOWER EXTREMITY ANGIOGRAPHY N/A 05/01/2017   Procedure: LOWER EXTREMITY ANGIOGRAPHY;  Surgeon: Sheree Penne Bruckner, MD;  Location: [facility] INVASIVE CV LAB;  Service: Cardiovascular;  Laterality: N/A;   LOWER EXTREMITY ANGIOGRAPHY N/A 05/15/2017   Procedure: LOWER EXTREMITY ANGIOGRAPHY;  Surgeon: Sheree Penne Bruckner, MD;  Location: [facility] INVASIVE CV LAB;  Service: Cardiovascular;  Laterality: N/A;   MULTIPLE TOOTH EXTRACTIONS     PERIPHERAL VASCULAR ATHERECTOMY  05/01/2017   Procedure: PERIPHERAL VASCULAR ATHERECTOMY;  Surgeon: Sheree Penne Bruckner, MD;  Location: [facility] INVASIVE CV LAB;  Service: Cardiovascular;;  Left SFA   PERIPHERAL VASCULAR ATHERECTOMY Right 11/18/2019   Procedure: PERIPHERAL VASCULAR ATHERECTOMY;  Surgeon: Sheree Penne Bruckner, MD;  Location: [facility] INVASIVE CV LAB;  Service: Cardiovascular;  Laterality: Right;  SFA   PERIPHERAL VASCULAR BALLOON ANGIOPLASTY  05/01/2017   Procedure: PERIPHERAL VASCULAR BALLOON ANGIOPLASTY;  Surgeon: Sheree Penne Bruckner, MD;   Location: [facility] INVASIVE CV LAB;  Service: Cardiovascular;;  Left Anterial Tibial Artery   PERIPHERAL VASCULAR BALLOON ANGIOPLASTY Right 01/15/2018   Procedure: PERIPHERAL VASCULAR BALLOON ANGIOPLASTY;  Surgeon: Sheree Penne Bruckner, MD;  Location: [facility] INVASIVE CV LAB;  Service: Cardiovascular;  Laterality: Right;  SFA atherectomy   PERIPHERAL VASCULAR BALLOON ANGIOPLASTY Right 11/18/2019   Procedure: PERIPHERAL VASCULAR BALLOON ANGIOPLASTY;  Surgeon: Sheree Penne Bruckner, MD;  Location: [facility] INVASIVE CV LAB;  Service: Cardiovascular;  Laterality: Right;  SFA   PERIPHERAL VASCULAR INTERVENTION Right 05/15/2017   Procedure: PERIPHERAL VASCULAR INTERVENTION;  Surgeon: Sheree Penne Bruckner, MD;  Location: [facility] INVASIVE CV LAB;  Service: Cardiovascular;  Laterality: Right;  SFA Stent   US  ECHOCARDIOGRAPHY  11-25-2008   EF 55-60%    No Known Allergies   PHYSICAL EXAM: General: The patient is alert and oriented x3 in no acute distress.  Dermatology: Skin is warm, dry and supple bilateral lower extremities. Interspaces are clear of maceration and debris.      Wound 1:  Location: Plantar left hallux        Depth: Partial-thickness to dermis        Wound Border: Minimal hyperkeratosis        Wound Base: Granular        Drainage: Minimal serosanguineous       Odor?:  None        Surrounding Tissue: Hyperkeratotic        Infected?:  No        Necrosis?:  No        Pain?:  Minimal        Tunneling: No       Dimensions (cm): 0.4 x 0.5 x 0.1 cm  Vascular: Pedal pulses are trace palpable  Neurological: Light touch sensation intact  ASSESSMENT / PLAN OF CARE: 1. 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 Ulcer of left foot, limited to breakdown of skin (HCC)     The ulceration was sharply debrided of hyperkeratotic and devitalized soft tissue with sterile #312 blade to the level of dermis.  Hemostasis obtained.  Promogran collagen was trimmed to the size of the wound and  placed into the wound followed by secondary dressing.  Reviewed off-loading with patient.  Reviewed daily dressing changes with patient.  The leftover Promogran collagen was handed to the patient and home health nursing may apply this to the wound since the dressing changes are occurring approximately every 3 days.  If nursing does not feel comfortable changing it we can just apply this at the office on an as-needed basis.  Otherwise continue with the current at home wound regimen.  Discussed risks / concerns regarding ulcer with patient and possible sequelae if left untreated.  Stressed importance of infection prevention at home. Short-term goals are: prevent infection, off-load ulcer, heal ulcer Long-term goals are:  prevent recurrence, prevent amputation.   Return in about 2 weeks (  around 02/23/2023) for f/u ulcer.   Awanda CHARM [place], DPM, FACFAS [facility]     [address] [place], [place] [address]                Office [phone]  Fax [phone]

## 2023-02-20 ENCOUNTER — Encounter (HOSPITAL_COMMUNITY)
Admission: RE | Disposition: A | Discharge status: Home / Self Care | Payer: Self-pay | Source: Home / Self Care | Attending: Vascular Surgery

## 2023-02-20 ENCOUNTER — Other Ambulatory Visit: Payer: Self-pay

## 2023-02-20 ENCOUNTER — Ambulatory Visit (HOSPITAL_COMMUNITY)
Admission: RE | Admit: 2023-02-20 | Discharge: 2023-02-20 | Disposition: A | Discharge status: Home / Self Care | Payer: 59 | Attending: Vascular Surgery | Admitting: Vascular Surgery

## 2023-02-20 DIAGNOSIS — E11621 Type 2 diabetes mellitus with foot ulcer: Secondary | ICD-10-CM | POA: Insufficient documentation

## 2023-02-20 DIAGNOSIS — Z794 Long term (current) use of insulin: Secondary | ICD-10-CM | POA: Diagnosis not present

## 2023-02-20 DIAGNOSIS — E1122 Type 2 diabetes mellitus with diabetic chronic kidney disease: Secondary | ICD-10-CM | POA: Diagnosis not present

## 2023-02-20 DIAGNOSIS — E1151 Type 2 diabetes mellitus with diabetic peripheral angiopathy without gangrene: Secondary | ICD-10-CM | POA: Insufficient documentation

## 2023-02-20 DIAGNOSIS — I130 Hypertensive heart and chronic kidney disease with heart failure and stage 1 through stage 4 chronic kidney disease, or unspecified chronic kidney disease: Secondary | ICD-10-CM | POA: Diagnosis not present

## 2023-02-20 DIAGNOSIS — Z7985 Long-term (current) use of injectable non-insulin antidiabetic drugs: Secondary | ICD-10-CM | POA: Diagnosis not present

## 2023-02-20 DIAGNOSIS — Z87891 Personal history of nicotine dependence: Secondary | ICD-10-CM | POA: Diagnosis not present

## 2023-02-20 DIAGNOSIS — E785 Hyperlipidemia, unspecified: Secondary | ICD-10-CM | POA: Insufficient documentation

## 2023-02-20 DIAGNOSIS — Z7984 Long term (current) use of oral hypoglycemic drugs: Secondary | ICD-10-CM | POA: Diagnosis not present

## 2023-02-20 DIAGNOSIS — I70235 Atherosclerosis of native arteries of right leg with ulceration of other part of foot: Secondary | ICD-10-CM | POA: Insufficient documentation

## 2023-02-20 DIAGNOSIS — I70245 Atherosclerosis of native arteries of left leg with ulceration of other part of foot: Secondary | ICD-10-CM | POA: Insufficient documentation

## 2023-02-20 DIAGNOSIS — L97519 Non-pressure chronic ulcer of other part of right foot with unspecified severity: Secondary | ICD-10-CM | POA: Diagnosis not present

## 2023-02-20 DIAGNOSIS — L97529 Non-pressure chronic ulcer of other part of left foot with unspecified severity: Secondary | ICD-10-CM | POA: Insufficient documentation

## 2023-02-20 DIAGNOSIS — Z7901 Long term (current) use of anticoagulants: Secondary | ICD-10-CM | POA: Insufficient documentation

## 2023-02-20 DIAGNOSIS — N183 Chronic kidney disease, stage 3 unspecified: Secondary | ICD-10-CM | POA: Insufficient documentation

## 2023-02-20 DIAGNOSIS — I7025 Atherosclerosis of native arteries of other extremities with ulceration: Secondary | ICD-10-CM

## 2023-02-20 HISTORY — PX: ABDOMINAL AORTOGRAM W/LOWER EXTREMITY: CATH118223

## 2023-02-20 HISTORY — PX: PERIPHERAL VASCULAR INTERVENTION: CATH118257

## 2023-02-20 LAB — POCT I-STAT, CHEM 8
BUN: 18 mg/dL (ref 8–23)
BUN: 19 mg/dL (ref 8–23)
Calcium, Ion: 1.06 mmol/L — ABNORMAL LOW (ref 1.15–1.40)
Calcium, Ion: 1.15 mmol/L (ref 1.15–1.40)
Chloride: 101 mmol/L (ref 98–111)
Chloride: 102 mmol/L (ref 98–111)
Creatinine, Ser: 2 mg/dL — ABNORMAL HIGH (ref 0.61–1.24)
Creatinine, Ser: 2.1 mg/dL — ABNORMAL HIGH (ref 0.61–1.24)
Glucose, Bld: 100 mg/dL — ABNORMAL HIGH (ref 70–99)
Glucose, Bld: 98 mg/dL (ref 70–99)
HCT: 44 % (ref 39.0–52.0)
HCT: 44 % (ref 39.0–52.0)
Hemoglobin: 15 g/dL (ref 13.0–17.0)
Hemoglobin: 15 g/dL (ref 13.0–17.0)
Potassium: 2.9 mmol/L — ABNORMAL LOW (ref 3.5–5.1)
Potassium: 2.9 mmol/L — ABNORMAL LOW (ref 3.5–5.1)
Sodium: 142 mmol/L (ref 135–145)
Sodium: 143 mmol/L (ref 135–145)
TCO2: 26 mmol/L (ref 22–32)
TCO2: 27 mmol/L (ref 22–32)

## 2023-02-20 LAB — GLUCOSE, CAPILLARY: Glucose-Capillary: 87 mg/dL (ref 70–99)

## 2023-02-20 SURGERY — ABDOMINAL AORTOGRAM W/LOWER EXTREMITY
Anesthesia: LOCAL | Laterality: Left

## 2023-02-20 MED ORDER — SODIUM CHLORIDE 0.9 % IV SOLN: INTRAVENOUS | Status: DC

## 2023-02-20 MED ORDER — HYDRALAZINE HCL 20 MG/ML IJ SOLN: 5.0000 mg | INTRAMUSCULAR | Status: DC | PRN

## 2023-02-20 MED ORDER — SODIUM CHLORIDE 0.9 % WEIGHT BASED INFUSION: 1.0000 mL/kg/h | INTRAVENOUS | Status: DC

## 2023-02-20 MED ORDER — ASPIRIN 81 MG PO CHEW
CHEWABLE_TABLET | ORAL | Status: DC | PRN
  Administered 2023-02-20: 81 mg via ORAL

## 2023-02-20 MED ORDER — MORPHINE SULFATE (PF) 2 MG/ML IV SOLN: 2.0000 mg | INTRAVENOUS | Status: DC | PRN

## 2023-02-20 MED ORDER — SODIUM CHLORIDE 0.9 % IV SOLN
250.0000 mL | INTRAVENOUS | Status: DC | PRN
Start: 2023-02-20 — End: 2023-02-20

## 2023-02-20 MED ORDER — LIDOCAINE HCL (PF) 1 % IJ SOLN
INTRAMUSCULAR | Status: AC
  Filled 2023-02-20: qty 30

## 2023-02-20 MED ORDER — ONDANSETRON HCL 4 MG/2ML IJ SOLN: 4.0000 mg | Freq: Four times a day (QID) | INTRAMUSCULAR | Status: DC | PRN

## 2023-02-20 MED ORDER — OXYCODONE HCL 5 MG PO TABS: 5.0000 mg | ORAL_TABLET | ORAL | Status: DC | PRN

## 2023-02-20 MED ORDER — LABETALOL HCL 5 MG/ML IV SOLN: 10.0000 mg | INTRAVENOUS | Status: DC | PRN

## 2023-02-20 MED ORDER — FENTANYL CITRATE (PF) 100 MCG/2ML IJ SOLN
INTRAMUSCULAR | Status: AC
  Filled 2023-02-20: qty 2

## 2023-02-20 MED ORDER — HEPARIN SODIUM (PORCINE) 1000 UNIT/ML IJ SOLN
INTRAMUSCULAR | Status: DC | PRN
  Administered 2023-02-20: 5000 [IU] via INTRAVENOUS

## 2023-02-20 MED ORDER — LIDOCAINE HCL (PF) 1 % IJ SOLN
INTRAMUSCULAR | Status: DC | PRN
  Administered 2023-02-20: 18 mL

## 2023-02-20 MED ORDER — IODIXANOL 320 MG/ML IV SOLN
INTRAVENOUS | Status: DC | PRN
  Administered 2023-02-20: 20 mL

## 2023-02-20 MED ORDER — ASPIRIN 81 MG PO TBEC
81.0000 mg | DELAYED_RELEASE_TABLET | Freq: Every day | ORAL | Status: DC
  Filled 2023-02-20: qty 1

## 2023-02-20 MED ORDER — HEPARIN (PORCINE) IN NACL 1000-0.9 UT/500ML-% IV SOLN
INTRAVENOUS | Status: DC | PRN
  Administered 2023-02-20 (×2): 500 mL

## 2023-02-20 MED ORDER — MIDAZOLAM HCL 2 MG/2ML IJ SOLN
INTRAMUSCULAR | Status: AC
  Filled 2023-02-20: qty 2

## 2023-02-20 MED ORDER — ASPIRIN 81 MG PO CHEW
CHEWABLE_TABLET | ORAL | Status: AC
  Filled 2023-02-20: qty 1

## 2023-02-20 MED ORDER — ACETAMINOPHEN 325 MG PO TABS: 650.0000 mg | ORAL_TABLET | ORAL | Status: DC | PRN

## 2023-02-20 MED ORDER — MIDAZOLAM HCL 2 MG/2ML IJ SOLN
INTRAMUSCULAR | Status: DC | PRN
  Administered 2023-02-20: 1 mg via INTRAVENOUS

## 2023-02-20 MED ORDER — SODIUM CHLORIDE 0.9% FLUSH: 3.0000 mL | Freq: Two times a day (BID) | INTRAVENOUS | Status: DC

## 2023-02-20 MED ORDER — SODIUM CHLORIDE 0.9% FLUSH: 3.0000 mL | INTRAVENOUS | Status: DC | PRN

## 2023-02-20 MED ORDER — HEPARIN SODIUM (PORCINE) 1000 UNIT/ML IJ SOLN
INTRAMUSCULAR | Status: AC
  Filled 2023-02-20: qty 10

## 2023-02-20 MED ORDER — FENTANYL CITRATE (PF) 100 MCG/2ML IJ SOLN
INTRAMUSCULAR | Status: DC | PRN
  Administered 2023-02-20: 50 ug via INTRAVENOUS

## 2023-02-20 SURGICAL SUPPLY — 20 items
BALLN MUSTANG 5.0X40 135 (BALLOONS) ×2
BALLOON MUSTANG 5.0X40 135 (BALLOONS) IMPLANT
CATH CROSS OVER TEMPO 5F (CATHETERS) IMPLANT
CATH OMNI FLUSH 5F 65CM (CATHETERS) IMPLANT
CLOSURE MYNX CONTROL 6F/7F (Vascular Products) IMPLANT
COVER DOME SNAP 22 D (MISCELLANEOUS) IMPLANT
GUIDEWIRE ANGLED .035X150CM (WIRE) IMPLANT
KIT ANGIASSIST CO2 SYSTEM (KITS) IMPLANT
KIT ENCORE 26 ADVANTAGE (KITS) IMPLANT
KIT MICROPUNCTURE NIT STIFF (SHEATH) IMPLANT
KIT SYRINGE INJ CVI SPIKEX1 (MISCELLANEOUS) IMPLANT
MAT PREVALON FULL STRYKER (MISCELLANEOUS) IMPLANT
SET ATX-X65L (MISCELLANEOUS) IMPLANT
SHEATH CATAPULT 6FR 45 (SHEATH) IMPLANT
SHEATH PINNACLE 5F 10CM (SHEATH) IMPLANT
SHEATH PINNACLE 6F 10CM (SHEATH) IMPLANT
STENT ELUVIA 6X40X130 (Permanent Stent) IMPLANT
TRAY PV CATH (CUSTOM PROCEDURE TRAY) ×3 IMPLANT
WIRE BENTSON .035X145CM (WIRE) IMPLANT
WIRE ROSEN-J .035X260CM (WIRE) IMPLANT

## 2023-02-20 NOTE — Op Note (Signed)
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 Randie Heinz, MD Procedure Performed: 1.  Percutaneous ultrasound-guided access and Mynx device closure right common femoral artery 2.  CO2 aortogram and bilateral lower extremity angiography 3.  Catheter selection left superficial femoral artery 4.  Stent to proximal left SFA with 6 x 40 mm Eluvia postdilated with 5 mm balloon 5.  Moderate sedation with fentanyl and Versed for 47 minutes  Indications: 83 year old male with a history of bilateral lower extremity percutaneous interventions.  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  Findings: Aorta is somewhat obscured by a history of spinal fusion but there does not appear to be any flow limitation into the bilateral common and external iliac arteries.  There is calcification of the left common femoral artery and a 90% stenosis of the proximal left SFA which was stented to 0% residual stenosis.  There is similarly distal calcification without flow-limiting stenosis and three-vessel runoff to the ankle and foot dominant via the posterior tibial artery.  The anterior tibial artery has approximately 50% narrowing in the midsegment but also does not appear to be flow-limiting.  On the right side the common femoral artery again is calcified there is stents from the very proximal SFA all the way to the adductor which are patent and he has three-vessel runoff dominant via the posterior tibial artery filling the foot.  He is optimized from a vascular standpoint and can have podiatry interventions as needed.  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   Procedure:  The patient was identified in the holding area and taken to room 8.  The patient was then placed supine on the table and prepped and draped in the usual sterile fashion.  A time out was called.  Ultrasound was used to evaluate the right common femoral artery which was patent.  The area was anesthetized with 1% lidocaine and cannulated with a micropuncture needle followed by wire sheath.  An ultrasound image was saved the permanent record.  Concomitantly we administered fentanyl and Versed as moderate sedation his vital signs were monitored throughout the case.  We placed the Bentson wire followed by 5 French sheath and placed Omni catheter to the level of L1 and CO2 aortogram was performed followed by pelvic angiography including the bilateral common femoral arteries.  A crossover catheter was required to cross the bifurcation with the Bentson wire we performed left lower extremity angiography with both CO2 followed by contrast of the affected areas.  We then placed a long 6 French sheath and the patient was given 5000 units of heparin.  A Rosen wire was placed distal to the high-grade stenosis of the left SFA and this was primarily stented with a drug-eluting stent 6 x 40 mm postdilated with a 5 x 40 mm balloon and completion demonstrated no residual stenosis.  Satisfied with this we exchanged for a short 6 French sheath on the right and performed right lower extremity angiography with CO2 and then deployed a minx device.  At completion the patient had very strong  signals bilaterally the anterior tibial and posterior tibial arteries with brisk capillary refill.  Plan will be to follow-up for wound check in a few weeks he is followed by podiatry and is optimized from a vascular standpoint.   Contrast: 20 cc  CO2: 550 cc  Shey Yott C. Randie Heinz, MD [facility] Office: [phone] Pager: [phone]

## 2023-02-20 NOTE — Discharge Instructions (Signed)
 Femoral Site Care The following information offers guidance on how to care for yourself after your procedure. Your health care provider may also give you more specific instructions. If you have problems or questions, contact your health care provider. What can I expect after the procedure? After the procedure, it is common to have bruising and tenderness at the incision site. This usually fades within 1-2 weeks. Follow these instructions at home: Incision site care  Follow instructions from your health care provider about how to take care of your incision site. Make sure you: Wash your hands with soap and water for at least 20 seconds before and after you change your bandage (dressing). If soap and water are not available, use hand sanitizer. Remove your dressing in 24 hours. Leave stitches (sutures), skin glue, or adhesive strips in place. These skin closures may need to stay in place for 2 weeks or longer. If adhesive strip edges start to loosen and curl up, you may trim the loose edges. Do not remove adhesive strips completely unless your health care provider tells you to do that. Do not take baths, swim, or use a hot tub for at least 1 week. You may shower 24 hours after the procedure or as told by your health care provider. Gently wash the incision site with plain soap and water. Pat the area dry with a clean towel. Do not rub the site. This may cause bleeding. Do not apply powder or lotion to the site. Keep the site clean and dry. Check your femoral site every day for signs of infection. Check for: Redness, swelling, or pain. Fluid or blood. Warmth. Pus or a bad smell. Activity If you were given a sedative during the procedure, it can affect you for several hours. Do not drive or operate machinery until your health care provider says that it is safe. Rest as told by your health care provider. Avoid sitting for a long time without moving. Get up to take short walks every 1-2 hours. This  is important to improve blood flow and breathing. Ask for help if you feel weak or unsteady. Return to your normal activities as told by your health care provider. Ask your health care provider what activities are safe for you and when you can return to work. Avoid activities that take a lot of effort for the first 2-3 days after your procedure, or as long as directed. Do not lift anything that is heavier than 10 lb (4.5 kg), or the limit that you are told, until your health care provider says that it is safe. General instructions Take over-the-counter and prescription medicines only as told by your health care provider. If you will be going home right after the procedure, plan to have a responsible adult care for you for the time you are told. This is important. Keep all follow-up visits. This is important. Contact a health care provider if: You have a fever or chills. You have any of these signs of infection at your incision site: Redness, swelling, or pain. Fluid or blood. Warmth. Pus or a bad smell. Get help right away if: The incision area swells very fast. The incision area is bleeding, and the bleeding does not stop when you hold steady pressure on the area. Your leg or foot becomes pale, cool, tingly, or numb. These symptoms may represent a serious problem that is an emergency. Do not wait to see if the symptoms will go away. Get medical help right away. Call your local emergency  services (911 in the [place] Do not drive yourself to the hospital. Summary After the procedure, it is common to have bruising and tenderness that fade within 1-2 weeks. Check your femoral site every day for signs of infection. Do not lift anything that is heavier than 10 lb (4.5 kg), or the limit that you are told, until your health care provider says that it is safe. Get help right away if the incision area swells very fast, you have bleeding at the incision area that does not stop, or your leg or foot  becomes pale, cool, or numb. This information is not intended to replace advice given to you by your health care provider. Make sure you discuss any questions you have with your health care provider. Document Revised: 10/07/2020 Document Reviewed: 03/08/2020 Elsevier Patient Education  2024 [employer].

## 2023-02-20 NOTE — Progress Notes (Addendum)
Patient is not completely sure the last time he had his Eliquis, maybe Friday, 02/17/23. He stated he believes his pharmacy removed the eliquis in his medication packs. Patient spoke to Sam Scientist, research (physical sciences)) at [facility] regarding removing eliquis.Daughter states " they do that for him".

## 2023-02-20 NOTE — Interval H&P Note (Signed)
History and Physical Interval Note:  02/20/2023 7:22 AM  Paul Chen  has presented today for surgery, with the diagnosis of Atherosclerosis of native arteries of the extremities with ulceration.  The various methods of treatment have been discussed with the patient and family. After consideration of risks, benefits and other options for treatment, the patient has consented to  Procedure(s): ABDOMINAL AORTOGRAM W/LOWER EXTREMITY (N/A) as a surgical intervention.  The patient's history has been reviewed, patient examined, no change in status, stable for surgery.  I have reviewed the patient's chart and labs.  Questions were answered to the patient's satisfaction.     [name]

## 2023-02-20 NOTE — Progress Notes (Signed)
Patient ambulated and voided. Procedure Site looks intact. Patient is ready for discharge

## 2023-02-20 NOTE — Progress Notes (Signed)
Arrived to cath lab holding bay 17. Patient drowsy but answers questions and follows commands appropriately. Right groin dressing is dry/intact. No bleeding or hematoma present. Post activity and precautions explained. Patient verbalized understanding. Call bell near. Will monitor per orders and protocol.Paul Chen

## 2023-02-21 ENCOUNTER — Encounter (HOSPITAL_COMMUNITY): Payer: Self-pay | Admitting: Vascular Surgery

## 2023-02-23 ENCOUNTER — Ambulatory Visit (INDEPENDENT_AMBULATORY_CARE_PROVIDER_SITE_OTHER): Payer: 59 | Admitting: Podiatry

## 2023-02-23 ENCOUNTER — Telehealth: Payer: Self-pay

## 2023-02-23 DIAGNOSIS — L97511 Non-pressure chronic ulcer of other part of right foot limited to breakdown of skin: Secondary | ICD-10-CM | POA: Diagnosis not present

## 2023-02-23 DIAGNOSIS — L97411 Non-pressure chronic ulcer of right heel and midfoot limited to breakdown of skin: Secondary | ICD-10-CM | POA: Diagnosis not present

## 2023-02-23 DIAGNOSIS — E11621 Type 2 diabetes mellitus with foot ulcer: Secondary | ICD-10-CM | POA: Diagnosis not present

## 2023-02-23 DIAGNOSIS — M2041 Other hammer toe(s) (acquired), right foot: Secondary | ICD-10-CM

## 2023-02-23 DIAGNOSIS — L97522 Non-pressure chronic ulcer of other part of left foot with fat layer exposed: Secondary | ICD-10-CM | POA: Diagnosis not present

## 2023-02-23 NOTE — Telephone Encounter (Signed)
Received a message from Taylor, [facility] RN with [phone]. She reports a new ulcer/macerated area on the patient's heel. Needs verbal order with instructions for how to treat this new area [phone]        thank you

## 2023-02-23 NOTE — Telephone Encounter (Signed)
spoke to [facility] about him, continue with the Promogran Collagen for the toe wounds with gauze dressings. She can do a foam dressing for the right posterior heel wound. Like our Biotain foam dressing we have here (that's what I put on his heel today).  Change bi weekly. He has a surgical shoe for right foot. He has upcoming appt for new diabetic shoes.

## 2023-02-23 NOTE — Progress Notes (Signed)
Chief Complaint  Patient presents with   Wound Check    2 week wound check for left great toe, still open and draining, toe is light red/pink.  There is a new spot on his Right heel posterior that is open and draining as well. Evonnie Pat is in both areas and [facility] is working with him she needs care order for new area. A1c 5.6 with a last PCP visit 3 weeks ago. Takes eliquis.   HPI: 83 y.o. male presenting today for follow-up of left hallux ulcer and right third toe ulcer.  Since patient was last seen he developed a new wound to the posterior aspect of the right heel.  He does note pain with this.  He states that he had a revascularization procedure to the lower extremity on 02/20/2023.  He states that he was only at the hospital for 1 day and was discharged same day.  He states that the wound nurse noticed the heel wound yesterday.  She is requesting new orders today for her to address this wound as well.  Patient denies fever, chills, night sweats, nausea/vomiting.  He states that home health care is coming out twice per week to change his dressings.  He is almost out of the Promogran collagen for the left hallux wound.  Patiently recently underwent angioplasty with stent to the proximal left superficial femoral artery by vascular.  They had noted he is optimized from a vascular standpoint and can have podiatry intervention as needed.  Past Medical History:  Diagnosis Date   Arthritis    Chronic renal insufficiency    CKD (chronic kidney disease), stage III (HCC)    COPD (chronic obstructive pulmonary disease) (HCC)    with restriction   Coronary disease October 2010   Status post stenting of the proximal to mid right coronary with DES.  Status post extensive stenting of the proximal right coronary to the crux.  He has moderate disease in  the left coronary system   Depression    Diabetes mellitus    Insulin Dependent   Diastolic dysfunction    Congestive heart failure with   Dyspnea     multifactorial. chronic, 11/18/9- not an issue at this time   Gout    History of blood transfusion    History of diabetic ulcer of foot    right   Hypertension    Hypoventilation    syndrome   Obesity    Pneumonia    Pulmonary embolism [facility]) March 2010   hx of and DVT in March of 2010 on chronic Coumadin therapy   Pulmonary hypertension (HCC)    Moderate to severe   Retroperitoneal bleed    Hx of right pelvic   Sleep apnea    Obstructive , no CPAP   Wears dentures     Past Surgical History:  Procedure Laterality Date   ABDOMINAL AORTOGRAM W/LOWER EXTREMITY Right 01/15/2018   Procedure: ABDOMINAL AORTOGRAM W/LOWER EXTREMITY;  Surgeon: Maeola Harman, MD;  Location: [facility] INVASIVE CV LAB;  Service: Cardiovascular;  Laterality: Right;   ABDOMINAL AORTOGRAM W/LOWER EXTREMITY Right 11/18/2019   Procedure: ABDOMINAL AORTOGRAM W/LOWER EXTREMITY;  Surgeon: Maeola Harman, MD;  Location: [facility] INVASIVE CV LAB;  Service: Cardiovascular;  Laterality: Right;   ABDOMINAL AORTOGRAM W/LOWER EXTREMITY Bilateral 02/20/2023   Procedure: ABDOMINAL AORTOGRAM W/LOWER EXTREMITY;  Surgeon: Maeola Harman, MD;  Location: [facility] INVASIVE CV LAB;  Service: Cardiovascular;  Laterality: Bilateral;   AMPUTATION TOE Left 05/04/2017  Procedure: Left hallux amputation;  Surgeon: Toni Arthurs, MD;  Location: [facility];  Service: Orthopedics;  Laterality: Left;   AMPUTATION TOE Right 12/21/2017   Procedure: Right hallux amputation;  Surgeon: Toni Arthurs, MD;  Location: [facility];  Service: Orthopedics;  Laterality: Right;    AMPUTATION TOE Right 02/15/2018   Procedure: Right 2nd toe amputation;  Surgeon: Toni Arthurs, MD;  Location: [facility] OR;  Service: Orthopedics;  Laterality: Right;  60   BACK SURGERY  2000   post   CARDIOVASCULAR STRESS TEST  11-14-2008   EF 44%   EXPLORATORY LAPAROTOMY  1972   POST   LOWER EXTREMITY ANGIOGRAPHY N/A 05/01/2017    Procedure: LOWER EXTREMITY ANGIOGRAPHY;  Surgeon: Maeola Harman, MD;  Location: [facility] INVASIVE CV LAB;  Service: Cardiovascular;  Laterality: N/A;   LOWER EXTREMITY ANGIOGRAPHY N/A 05/15/2017   Procedure: LOWER EXTREMITY ANGIOGRAPHY;  Surgeon: Maeola Harman, MD;  Location: [facility] INVASIVE CV LAB;  Service: Cardiovascular;  Laterality: N/A;   MULTIPLE TOOTH EXTRACTIONS     PERIPHERAL VASCULAR ATHERECTOMY  05/01/2017   Procedure: PERIPHERAL VASCULAR ATHERECTOMY;  Surgeon: Maeola Harman, MD;  Location: [facility] INVASIVE CV LAB;  Service: Cardiovascular;;  Left SFA   PERIPHERAL VASCULAR ATHERECTOMY Right 11/18/2019   Procedure: PERIPHERAL VASCULAR ATHERECTOMY;  Surgeon: Maeola Harman, MD;  Location: [facility] INVASIVE CV LAB;  Service: Cardiovascular;  Laterality: Right;  SFA   PERIPHERAL VASCULAR BALLOON ANGIOPLASTY  05/01/2017   Procedure: PERIPHERAL VASCULAR BALLOON ANGIOPLASTY;  Surgeon: Maeola Harman, MD;  Location: [facility] INVASIVE CV LAB;  Service: Cardiovascular;;  Left Anterial Tibial Artery   PERIPHERAL VASCULAR BALLOON ANGIOPLASTY Right 01/15/2018   Procedure: PERIPHERAL VASCULAR BALLOON ANGIOPLASTY;  Surgeon: Maeola Harman, MD;  Location: [facility] INVASIVE CV LAB;  Service: Cardiovascular;  Laterality: Right;  SFA atherectomy   PERIPHERAL VASCULAR BALLOON ANGIOPLASTY Right 11/18/2019   Procedure: PERIPHERAL VASCULAR BALLOON ANGIOPLASTY;  Surgeon: Maeola Harman, MD;  Location: [facility] INVASIVE CV LAB;  Service: Cardiovascular;  Laterality: Right;  SFA   PERIPHERAL VASCULAR INTERVENTION Right 05/15/2017   Procedure: PERIPHERAL VASCULAR INTERVENTION;  Surgeon: Maeola Harman, MD;  Location: [facility] INVASIVE CV LAB;  Service: Cardiovascular;  Laterality: Right;  SFA Stent   PERIPHERAL VASCULAR INTERVENTION Left 02/20/2023   Procedure: PERIPHERAL VASCULAR INTERVENTION;  Surgeon: Maeola Harman, MD;  Location: [facility] INVASIVE CV LAB;  Service:  Cardiovascular;  Laterality: Left;  Lt SFA   US ECHOCARDIOGRAPHY  11-25-2008   EF 55-60%   No Known Allergies  Smoker?: former smoker Fall PHYSICAL EXAM: General: The patient is alert and oriented x3 in no acute distress.  Dermatology: Skin is warm, dry and supple bilateral lower extremities. Interspaces are clear of maceration and debris.      Wound 1:  Location:  Right heel        Depth:  partial thickness        Wound Border: minimal hyperkeratosis        Wound Base:  granular        Drainage: minimal serous       Odor?:  none        Surrounding Tissue:  clear        Infected?:  no        Necrosis?:  no        Pain?:  yes        Tunneling:  no        Dimensions (cm):  2.2 x 2.0  x 0.2cm   Wound 2:  Location:  left hallux plantar aspect       Depth:  full-thickness       Wound Border:  hyperkeratotic       Wound Base:  mostly granular       Drainage: minimal serosanguinous       Odor?:  none       Surrounding Tissue:  dry       Infected?:  no       Necrosis?:  no       Pain?:  yes       Tunneling:  no       Dimensions (cm):  0.7 x 0.4 x 0.2cm     Wound 3:  Location:  distal-lateral right 3rd toe       Depth:  partial thickness       Wound Border:  minimally hyperkeratotic       Wound Base:   granular       Drainage: none       Odor?:  none       Surrounding Tissue:  clear       Infected?:  no       Necrosis?:  none       Pain?:  yes            Tunneling:  none       Dimensions (cm): 0.2 x 0.2 x 0.1cm    Vascular: Pedal pulses are trace palpable  Musculoskeletal Exam: there is contracture of the right 3rd toe with medial angulation of toe.  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 Ulcer of left foot, with fat layer exposed (HCC)   3. Ulcer of right heel and midfoot, limited to breakdown of skin (HCC)   4. Chronic toe ulcer, right, limited to breakdown of skin  (HCC)   5. Hammertoe of right foot     The ulceration was sharply debrided of hyperkeratotic and devitalized soft tissue with sterile #312 blade to the level of dermis on right heel and 3rd toe, and subQ tissue on left hallux .  Hemostasis obtained.  Antibiotic ointment and DSD applied.  Reviewed off-loading with patient.  Surgical off-loading peg shoe dispensed for patient to wear at all times WB for the 2 ulcers on the right foot.  Patient was informed how common it is after having digital amputations, for the adjacent toe to become unstable and develop hammertoe and pressure irritation.    Reviewed daily dressing changes with patient.  Discussed risks / concerns regarding ulcer with patient and possible sequelae if left untreated.  Stressed importance of infection prevention at home. Short-term goals are: prevent infection, off-load ulcer, heal ulcer Long-term goals are:  prevent recurrence, prevent amputation.   Return in about 2 weeks (around 03/09/2023) for f/u ulcer.   Clerance Lav, DPM, FACFAS [facility]     [address] [place], [place] [address]                Office [phone]  Fax [phone]

## 2023-02-26 DIAGNOSIS — L97522 Non-pressure chronic ulcer of other part of left foot with fat layer exposed: Secondary | ICD-10-CM | POA: Insufficient documentation

## 2023-02-26 DIAGNOSIS — M2041 Other hammer toe(s) (acquired), right foot: Secondary | ICD-10-CM | POA: Insufficient documentation

## 2023-02-26 DIAGNOSIS — E11621 Type 2 diabetes mellitus with foot ulcer: Secondary | ICD-10-CM | POA: Insufficient documentation

## 2023-02-26 DIAGNOSIS — L97511 Non-pressure chronic ulcer of other part of right foot limited to breakdown of skin: Secondary | ICD-10-CM | POA: Insufficient documentation

## 2023-02-26 DIAGNOSIS — L97411 Non-pressure chronic ulcer of right heel and midfoot limited to breakdown of skin: Secondary | ICD-10-CM | POA: Insufficient documentation

## 2023-03-09 ENCOUNTER — Ambulatory Visit (INDEPENDENT_AMBULATORY_CARE_PROVIDER_SITE_OTHER): Payer: 59 | Admitting: Podiatry

## 2023-03-09 DIAGNOSIS — L97411 Non-pressure chronic ulcer of right heel and midfoot limited to breakdown of skin: Secondary | ICD-10-CM | POA: Diagnosis not present

## 2023-03-09 DIAGNOSIS — E11621 Type 2 diabetes mellitus with foot ulcer: Secondary | ICD-10-CM

## 2023-03-09 DIAGNOSIS — L97522 Non-pressure chronic ulcer of other part of left foot with fat layer exposed: Secondary | ICD-10-CM | POA: Diagnosis not present

## 2023-03-09 DIAGNOSIS — L97511 Non-pressure chronic ulcer of other part of right foot limited to breakdown of skin: Secondary | ICD-10-CM

## 2023-03-09 NOTE — Progress Notes (Signed)
 Chief Complaint  Patient presents with   Diabetic Ulcer    Right heel looks good with pink skin and a small scab in the center, left great toe still open in the center and draining. Last A1c 5.9 and he sees his PCP on Feb 12.SABRA Emily Petri   HPI: 83 y.o. male presents today for f/u ulcer bilateral feet.  HHC is coming out to his home and helping him with dressing changes.  He is pleased with their care.    Past Medical History:  Diagnosis Date   Arthritis    Chronic renal insufficiency    CKD (chronic kidney disease), stage III (HCC)    COPD (chronic obstructive pulmonary disease) (HCC)    with restriction   Coronary disease October 2010   Status post stenting of the proximal to mid right coronary with DES.  Status post extensive stenting of the proximal right coronary to the crux.  He has moderate disease in  the left coronary system   Depression    Diabetes mellitus    Insulin  Dependent   Diastolic dysfunction    Congestive heart failure with   Dyspnea    multifactorial. chronic, 11/18/9- not an issue at this time   Gout    History of blood transfusion    History of diabetic ulcer of foot    right   Hypertension    Hypoventilation    syndrome   Obesity    Pneumonia    Pulmonary embolism [facility]) March 2010   hx of and DVT in March of 2010 on chronic Coumadin  therapy   Pulmonary hypertension (HCC)    Moderate to severe   Retroperitoneal bleed    Hx of right pelvic   Sleep apnea    Obstructive , no CPAP   Wears dentures     Past Surgical History:  Procedure Laterality Date   ABDOMINAL AORTOGRAM W/LOWER EXTREMITY Right 01/15/2018   Procedure: ABDOMINAL AORTOGRAM W/LOWER EXTREMITY;  Surgeon: Sheree Penne Bruckner, MD;  Location: [facility] INVASIVE CV LAB;  Service: Cardiovascular;  Laterality: Right;   ABDOMINAL AORTOGRAM W/LOWER EXTREMITY Right 11/18/2019   Procedure: ABDOMINAL AORTOGRAM W/LOWER EXTREMITY;  Surgeon: Sheree Penne Bruckner, MD;  Location: [facility]  INVASIVE CV LAB;  Service: Cardiovascular;  Laterality: Right;   ABDOMINAL AORTOGRAM W/LOWER EXTREMITY Bilateral 02/20/2023   Procedure: ABDOMINAL AORTOGRAM W/LOWER EXTREMITY;  Surgeon: Sheree Penne Bruckner, MD;  Location: [facility] INVASIVE CV LAB;  Service: Cardiovascular;  Laterality: Bilateral;   AMPUTATION TOE Left 05/04/2017   Procedure: Left hallux amputation;  Surgeon: Kit Rush, MD;  Location: [facility];  Service: Orthopedics;  Laterality: Left;   AMPUTATION TOE Right 12/21/2017   Procedure: Right hallux amputation;  Surgeon: Kit Rush, MD;  Location: [facility];  Service: Orthopedics;  Laterality: Right;    AMPUTATION TOE Right 02/15/2018   Procedure: Right 2nd toe amputation;  Surgeon: Kit Rush, MD;  Location: [facility] OR;  Service: Orthopedics;  Laterality: Right;  60   BACK SURGERY  2000   post   CARDIOVASCULAR STRESS TEST  11-14-2008   EF 44%   EXPLORATORY LAPAROTOMY  1972   POST   LOWER EXTREMITY ANGIOGRAPHY N/A 05/01/2017   Procedure: LOWER EXTREMITY ANGIOGRAPHY;  Surgeon: Sheree Penne Bruckner, MD;  Location: [facility] INVASIVE CV LAB;  Service: Cardiovascular;  Laterality: N/A;   LOWER EXTREMITY ANGIOGRAPHY N/A 05/15/2017   Procedure: LOWER EXTREMITY ANGIOGRAPHY;  Surgeon: Sheree Penne Bruckner, MD;  Location: [facility] INVASIVE CV LAB;  Service: Cardiovascular;  Laterality: N/A;   MULTIPLE TOOTH EXTRACTIONS     PERIPHERAL VASCULAR ATHERECTOMY  05/01/2017   Procedure: PERIPHERAL VASCULAR ATHERECTOMY;  Surgeon: Sheree Penne Bruckner, MD;  Location: [facility] INVASIVE CV LAB;  Service: Cardiovascular;;  Left SFA   PERIPHERAL VASCULAR ATHERECTOMY Right 11/18/2019   Procedure: PERIPHERAL VASCULAR ATHERECTOMY;  Surgeon: Sheree Penne Bruckner, MD;  Location: [facility] INVASIVE CV LAB;  Service: Cardiovascular;  Laterality: Right;  SFA   PERIPHERAL VASCULAR BALLOON ANGIOPLASTY  05/01/2017   Procedure: PERIPHERAL VASCULAR BALLOON ANGIOPLASTY;  Surgeon: Sheree Penne Bruckner, MD;  Location: [facility] INVASIVE CV LAB;  Service: Cardiovascular;;  Left Anterial Tibial Artery   PERIPHERAL VASCULAR BALLOON ANGIOPLASTY Right 01/15/2018   Procedure: PERIPHERAL VASCULAR BALLOON ANGIOPLASTY;  Surgeon: Sheree Penne Bruckner, MD;  Location: [facility] INVASIVE CV LAB;  Service: Cardiovascular;  Laterality: Right;  SFA atherectomy   PERIPHERAL VASCULAR BALLOON ANGIOPLASTY Right 11/18/2019   Procedure: PERIPHERAL VASCULAR BALLOON ANGIOPLASTY;  Surgeon: Sheree Penne Bruckner, MD;  Location: [facility] INVASIVE CV LAB;  Service: Cardiovascular;  Laterality: Right;  SFA   PERIPHERAL VASCULAR INTERVENTION Right 05/15/2017   Procedure: PERIPHERAL VASCULAR INTERVENTION;  Surgeon: Sheree Penne Bruckner, MD;  Location: [facility] INVASIVE CV LAB;  Service: Cardiovascular;  Laterality: Right;  SFA Stent   PERIPHERAL VASCULAR INTERVENTION Left 02/20/2023   Procedure: PERIPHERAL VASCULAR INTERVENTION;  Surgeon: Sheree Penne Bruckner, MD;  Location: [facility] INVASIVE CV LAB;  Service: Cardiovascular;  Laterality: Left;  Lt SFA   US  ECHOCARDIOGRAPHY  11-25-2008   EF 55-60%   No Known Allergies   Physical Exam: Ulcers show improvement in size and depth.  Wound bases are granular and need minimal to no debridement today.  No active drainage noted.    Images:       Assessment/Plan of Care: 1. 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 Ulcer of left foot, with fat layer exposed (HCC)   3. Chronic toe ulcer, right, limited to breakdown of skin (HCC)   4. Ulcer of right heel and midfoot, limited to breakdown of skin [facility])    Discussed clinical findings with patient today.  Informed patient that the wounds are showing improvement.  The hallux was debrided of hyperkeratotic tissue around the rim of the ulcer.  They were redressed with abx ointment and DSD's.  Continue with HHC.  F/u 2 weeks.   Awanda CHARM Imperial, DPM, FACFAS [facility]     [address] [place], [place] [address]                Office [phone]  Fax [phone]

## 2023-03-14 ENCOUNTER — Other Ambulatory Visit: Payer: 59

## 2023-03-17 NOTE — Progress Notes (Signed)
Spoke with patient, The majority of his pain is in his back. I let him know he needs to contact [facility] where he is treated for his back pain. His apt on 02/20 is acceptable and he stated that his wounds seem to be doing well.  CT

## 2023-03-23 ENCOUNTER — Ambulatory Visit: Payer: 59 | Admitting: Podiatry

## 2023-03-24 ENCOUNTER — Other Ambulatory Visit: Payer: Self-pay

## 2023-03-24 DIAGNOSIS — I7025 Atherosclerosis of native arteries of other extremities with ulceration: Secondary | ICD-10-CM

## 2023-03-29 ENCOUNTER — Ambulatory Visit (INDEPENDENT_AMBULATORY_CARE_PROVIDER_SITE_OTHER): Payer: 59 | Admitting: Podiatry

## 2023-03-29 DIAGNOSIS — L97511 Non-pressure chronic ulcer of other part of right foot limited to breakdown of skin: Secondary | ICD-10-CM

## 2023-03-29 DIAGNOSIS — M2141 Flat foot [pes planus] (acquired), right foot: Secondary | ICD-10-CM

## 2023-03-29 DIAGNOSIS — L97522 Non-pressure chronic ulcer of other part of left foot with fat layer exposed: Secondary | ICD-10-CM

## 2023-03-29 DIAGNOSIS — M2142 Flat foot [pes planus] (acquired), left foot: Secondary | ICD-10-CM

## 2023-03-29 DIAGNOSIS — E11621 Type 2 diabetes mellitus with foot ulcer: Secondary | ICD-10-CM | POA: Diagnosis not present

## 2023-03-29 DIAGNOSIS — M2041 Other hammer toe(s) (acquired), right foot: Secondary | ICD-10-CM

## 2023-03-29 NOTE — Progress Notes (Unsigned)
 Chief Complaint  Patient presents with   Wound Check    Right 2nd toe and heel, Left great toe. Right 2nd is closed with a scab, the heel looks healed with some dry skin. The left great toe is still open and draining. All three were covered and he asked if he can go with out the covering on the heel now? Last A1c 2 months ago 5.7 takes elliquis   HPI: 83 y.o. male presenting today for follow-up of bilateral ulcers.  Patient feels the right heel ulcer is closed.  He would like to see if the daily care for the heel can be discontinued.  Notes that he still has drainage coming from the left great toe wound.  Home health care is still coming out to his house at least twice per week.  Patient denies fever, chills, nausea/vomiting  Past Medical History:  Diagnosis Date   Arthritis    Chronic renal insufficiency    CKD (chronic kidney disease), stage III (HCC)    COPD (chronic obstructive pulmonary disease) (HCC)    with restriction   Coronary disease October 2010   Status post stenting of the proximal to mid right coronary with DES.  Status post extensive stenting of the proximal right coronary to the crux.  He has moderate disease in  the left coronary system   Depression    Diabetes mellitus    Insulin Dependent   Diastolic dysfunction    Congestive heart failure with   Dyspnea    multifactorial. chronic, 11/18/9- not an issue at this time   Gout    History of blood transfusion    History of diabetic ulcer of foot    right   Hypertension    Hypoventilation    syndrome   Obesity    Pneumonia    Pulmonary embolism [facility]) March 2010   hx of and DVT in March of 2010 on chronic Coumadin therapy   Pulmonary hypertension (HCC)    Moderate to severe   Retroperitoneal bleed    Hx of right pelvic   Sleep apnea    Obstructive , no CPAP   Wears dentures     Past Surgical History:  Procedure Laterality Date   ABDOMINAL AORTOGRAM W/LOWER EXTREMITY Right 01/15/2018   Procedure:  ABDOMINAL AORTOGRAM W/LOWER EXTREMITY;  Surgeon: Maeola Harman, MD;  Location: [facility] INVASIVE CV LAB;  Service: Cardiovascular;  Laterality: Right;   ABDOMINAL AORTOGRAM W/LOWER EXTREMITY Right 11/18/2019   Procedure: ABDOMINAL AORTOGRAM W/LOWER EXTREMITY;  Surgeon: Maeola Harman, MD;  Location: [facility] INVASIVE CV LAB;  Service: Cardiovascular;  Laterality: Right;   ABDOMINAL AORTOGRAM W/LOWER EXTREMITY Bilateral 02/20/2023   Procedure: ABDOMINAL AORTOGRAM W/LOWER EXTREMITY;  Surgeon: Maeola Harman, MD;  Location: [facility] INVASIVE CV LAB;  Service: Cardiovascular;  Laterality: Bilateral;   AMPUTATION TOE Left 05/04/2017   Procedure: Left hallux amputation;  Surgeon: Toni Arthurs, MD;  Location: [facility];  Service: Orthopedics;  Laterality: Left;   AMPUTATION TOE Right 12/21/2017   Procedure: Right hallux amputation;  Surgeon: Toni Arthurs, MD;  Location: [facility];  Service: Orthopedics;  Laterality: Right;    AMPUTATION TOE Right 02/15/2018   Procedure: Right 2nd toe amputation;  Surgeon: Toni Arthurs, MD;  Location: [facility] OR;  Service: Orthopedics;  Laterality: Right;  60   BACK SURGERY  2000   post   CARDIOVASCULAR STRESS TEST  11-14-2008   EF 44%   EXPLORATORY LAPAROTOMY  1972  POST   LOWER EXTREMITY ANGIOGRAPHY N/A 05/01/2017   Procedure: LOWER EXTREMITY ANGIOGRAPHY;  Surgeon: Maeola Harman, MD;  Location: [facility] INVASIVE CV LAB;  Service: Cardiovascular;  Laterality: N/A;   LOWER EXTREMITY ANGIOGRAPHY N/A 05/15/2017   Procedure: LOWER EXTREMITY ANGIOGRAPHY;  Surgeon: Maeola Harman, MD;  Location: [facility] INVASIVE CV LAB;  Service: Cardiovascular;  Laterality: N/A;   MULTIPLE TOOTH EXTRACTIONS     PERIPHERAL VASCULAR ATHERECTOMY  05/01/2017   Procedure: PERIPHERAL VASCULAR ATHERECTOMY;  Surgeon: Maeola Harman, MD;  Location: [facility] INVASIVE CV LAB;  Service: Cardiovascular;;  Left SFA   PERIPHERAL VASCULAR  ATHERECTOMY Right 11/18/2019   Procedure: PERIPHERAL VASCULAR ATHERECTOMY;  Surgeon: Maeola Harman, MD;  Location: [facility] INVASIVE CV LAB;  Service: Cardiovascular;  Laterality: Right;  SFA   PERIPHERAL VASCULAR BALLOON ANGIOPLASTY  05/01/2017   Procedure: PERIPHERAL VASCULAR BALLOON ANGIOPLASTY;  Surgeon: Maeola Harman, MD;  Location: [facility] INVASIVE CV LAB;  Service: Cardiovascular;;  Left Anterial Tibial Artery   PERIPHERAL VASCULAR BALLOON ANGIOPLASTY Right 01/15/2018   Procedure: PERIPHERAL VASCULAR BALLOON ANGIOPLASTY;  Surgeon: Maeola Harman, MD;  Location: [facility] INVASIVE CV LAB;  Service: Cardiovascular;  Laterality: Right;  SFA atherectomy   PERIPHERAL VASCULAR BALLOON ANGIOPLASTY Right 11/18/2019   Procedure: PERIPHERAL VASCULAR BALLOON ANGIOPLASTY;  Surgeon: Maeola Harman, MD;  Location: [facility] INVASIVE CV LAB;  Service: Cardiovascular;  Laterality: Right;  SFA   PERIPHERAL VASCULAR INTERVENTION Right 05/15/2017   Procedure: PERIPHERAL VASCULAR INTERVENTION;  Surgeon: Maeola Harman, MD;  Location: [facility] INVASIVE CV LAB;  Service: Cardiovascular;  Laterality: Right;  SFA Stent   PERIPHERAL VASCULAR INTERVENTION Left 02/20/2023   Procedure: PERIPHERAL VASCULAR INTERVENTION;  Surgeon: Maeola Harman, MD;  Location: [facility] INVASIVE CV LAB;  Service: Cardiovascular;  Laterality: Left;  Lt SFA   US ECHOCARDIOGRAPHY  11-25-2008   EF 55-60%   No Known Allergies   PHYSICAL EXAM: General: The patient is alert and oriented x3 in no acute distress.  Dermatology: Skin is warm, dry and supple bilateral lower extremities. Interspaces are clear of maceration and debris.      Wound 1:  Location: Plantar left hallux        Depth: Full-thickness        Wound Border: Hyperkeratotic        Wound Base: Granular        Drainage: Serosanguineous       Odor?:  None        Surrounding Tissue: No erythema . localized edema        Infected?:  Ulcer swabbed for  microorganisms today        Necrosis?:  No        Pain?:  Minimal        Tunneling: No       Dimensions (cm): 0.8 x 0.4 x 0.2 cm   Wound 2:  Location: Distal-lateral right third toe       Depth: Partial-thickness       Wound Border: Hyperkeratotic and a sclerotic       Wound Base: Granular       Drainage: Serosanguineous       Odor?:  None       Surrounding Tissue: Localized erythema and edema to the right third toe       Infected?:  Wound swabbed for bacterial culture       Necrosis?:  No       Pain?:  No       Tunneling:  No       Dimensions (cm): 0.3 x 0.2 x 0.2 cm   Vascular: Pedal pulses are trace palpable  Neurological: Light touch sensation diminished  ASSESSMENT / PLAN OF CARE: 1. 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 Chronic toe ulcer, right, limited to breakdown of skin (HCC)   3. Ulcer of left foot, with fat layer exposed (HCC)    Culture swabs of the wound(s) were obtained today.  The ulceration was sharply debrided of hyperkeratotic and devitalized soft tissue with sterile #312 blade to the level of subcutaneous fat.  Hemostasis obtained.  Promogran Prisma collagen, antibiotic ointment and DSD applied.  Reviewed off-loading with patient.  Patient informed he can discontinue the daily dressing changes for the right heel wound.  He can return to an open toe shoe on the right foot.  Do not want a closed toe shoe on the right foot secondary to the distal toe ulcer.  Reviewed daily dressing changes with patient.  Informed patient we will call him once we receive the culture results as we may need to incorporate oral antibiotics at this time.  Discussed risks / concerns regarding ulcer with patient and possible sequelae if left untreated.  Stressed importance of infection prevention at home. Short-term goals are: prevent infection, off-load ulcer, heal ulcer Long-term goals are:  prevent recurrence, prevent amputation.   Return in about 2 weeks  (around 04/12/2023) for f/u ulcer.  Plan for x-ray at next appointment with ulcerated toes elevated on lateral views, if wounds show no additional improvement.   Clerance Lav, DPM, FACFAS [facility]     [address] [place], [place] [address]                Office [phone]  Fax [phone]

## 2023-04-03 ENCOUNTER — Other Ambulatory Visit: Payer: Self-pay | Admitting: Podiatry

## 2023-04-03 MED ORDER — NUZYRA 150 MG PO TABS: 300.0000 mg | ORAL_TABLET | Freq: Every day | ORAL | 0 refills | Status: AC

## 2023-04-03 NOTE — Progress Notes (Signed)
 Reviewed patient's culture.  Grew out several organisms: Enterobacter cloaca, Enterococcus faecalis, E. coli, Pinnacle via magna, Klebsiella oxytocin, Peptoniphilus harei, S. Aureus, Actinotignum schaalii  One of the recommended medications to treat this was [place].  Will have our [place] office contact the patient to stop by and pick up the starter box of the medication.  He will take 2 pills once daily for a total of 14 days.  The remainder of the prescription was sent to [facility] in [place] at the [address] location to be mailed to the patient.

## 2023-04-04 ENCOUNTER — Telehealth: Payer: Self-pay

## 2023-04-04 NOTE — Telephone Encounter (Signed)
 Spoke with Home health. Mr. Paul Chen is on his way back to Hospital via his daughter. He has altered mental status, he fell again, and is in need of inpatient care. CT.

## 2023-04-04 NOTE — Progress Notes (Signed)
 Spoke with daughter, Martyn Malay, She is going to try and reach Mr. Bugaj and see if he can get here to pick up the starter pack of Nuzyra. He has an Apt on 04/12/23. He came home from hospital yesterday.

## 2023-04-05 ENCOUNTER — Ambulatory Visit (HOSPITAL_COMMUNITY): Payer: 59

## 2023-04-05 ENCOUNTER — Ambulatory Visit (HOSPITAL_COMMUNITY): Payer: 59 | Attending: Vascular Surgery

## 2023-04-05 NOTE — Progress Notes (Deleted)
 Office Note   History of Present Illness   Paul Chen is a 83 y.o. (1940/04/21) male who presents for surveillance of PAD. They have a history of ***  The patient returns today for follow up. He/she denies any recent medical history changes. The patient also denies any claudication, rest pain, or tissue loss of the lower extremities.  Current Outpatient Medications  Medication Sig Dispense Refill   albuterol (PROVENTIL) (2.5 MG/3ML) 0.083% nebulizer solution Take 2.5 mg by nebulization every 4 (four) hours as needed for wheezing or shortness of breath.      albuterol (VENTOLIN HFA) 108 (90 Base) MCG/ACT inhaler Inhale 1-2 puffs into the lungs every 6 (six) hours as needed for wheezing or shortness of breath.     allopurinol (ZYLOPRIM) 100 MG tablet Take 100 mg by mouth daily.     amLODipine (NORVASC) 5 MG tablet Take 5 mg by mouth daily.     apixaban (ELIQUIS) 2.5 MG TABS tablet Take 2.5 mg by mouth 2 (two) times daily.     atorvastatin (LIPITOR) 40 MG tablet Take 1 tablet (40 mg total) by mouth daily. 90 tablet 3   chlorthalidone (HYGROTON) 25 MG tablet Take 25 mg by mouth daily.     dronedarone (MULTAQ) 400 MG tablet Take 400 mg by mouth 2 (two) times daily.     DULoxetine (CYMBALTA) 30 MG capsule Take 30 mg by mouth daily.     empagliflozin (JARDIANCE) 10 MG TABS tablet Take 10 mg by mouth daily.     insulin lispro (HUMALOG) 100 UNIT/ML KwikPen Inject into the skin. Uses in insulin pump VGO 20     metoprolol tartrate (LOPRESSOR) 25 MG tablet Take 25 mg by mouth 2 (two) times daily.     mirtazapine (REMERON) 45 MG tablet Take 45 mg by mouth at bedtime.     Multiple Vitamin (MULTIVITAMIN WITH MINERALS) TABS tablet Take 1 tablet by mouth daily. ABC Complete Senior Multivitamin for 50+     nitroGLYCERIN (NITROSTAT) 0.4 MG SL tablet Place 0.4 mg under the tongue every 5 (five) minutes x 3 doses as needed for chest pain.      Omadacycline Tosylate (NUZYRA) 150 MG TABS Take 2 tablets  (300 mg total) by mouth daily for 13 days. 26 tablet 0   oxyCODONE-acetaminophen (PERCOCET) 10-325 MG tablet Take 1 tablet by mouth 4 (four) times daily as needed for pain.     OZEMPIC, 0.25 OR 0.5 MG/DOSE, 2 MG/1.5ML SOPN Inject 0.5 mg into the skin every Saturday.     pregabalin (LYRICA) 50 MG capsule Take 50 mg by mouth 3 (three) times daily.     tamsulosin (FLOMAX) 0.4 MG CAPS capsule Take 0.4 mg by mouth 2 (two) times daily.     No current facility-administered medications for this visit.    ***REVIEW OF SYSTEMS (negative unless checked):   Cardiac:  []  Chest pain or chest pressure? []  Shortness of breath upon activity? []  Shortness of breath when lying flat? []  Irregular heart rhythm?  Vascular:  []  Pain in calf, thigh, or hip brought on by walking? []  Pain in feet at night that wakes you up from your sleep? []  Blood clot in your veins? []  Leg swelling?  Pulmonary:  []  Oxygen at home? []  Productive cough? []  Wheezing?  Neurologic:  []  Sudden weakness in arms or legs? []  Sudden numbness in arms or legs? []  Sudden onset of difficult speaking or slurred speech? []  Temporary loss of vision in one eye? []   Problems with dizziness?  Gastrointestinal:  []  Blood in stool? []  Vomited blood?  Genitourinary:  []  Burning when urinating? []  Blood in urine?  Psychiatric:  []  Major depression  Hematologic:  []  Bleeding problems? []  Problems with blood clotting?  Dermatologic:  []  Rashes or ulcers?  Constitutional:  []  Fever or chills?  Ear/Nose/Throat:  []  Change in hearing? []  Nose bleeds? []  Sore throat?  Musculoskeletal:  []  Back pain? []  Joint pain? []  Muscle pain?   Physical Examination  ***There were no vitals filed for this visit. ***There is no height or weight on file to calculate BMI.  General:  WDWN in NAD; vital signs documented above Gait: Not observed HENT: WNL, normocephalic Pulmonary: normal non-labored breathing , without rales,  rhonchi,  wheezing Cardiac: {Desc; regular/irreg:14544} HR, without murmurs {With/Without:20273} carotid bruit*** Abdomen: soft, NT, no masses Skin: {With/Without:20273} rashes Vascular Exam/Pulses: Palpable/nonpalpable femoral pulses, palpable/nonpalpable popliteal pulses, palpable/nonpalpable pedal pulses. Left DP/PT/Peroneal doppler signals. Right DP/PT/Peroneal doppler signals Extremities: {With/Without:20273} ischemic changes, {With/Without:20273} gangrene , {With/Without:20273} cellulitis; {With/Without:20273} open wounds;  Musculoskeletal: no muscle wasting or atrophy  Neurologic: A&O X 3;  No focal weakness or paresthesias are detected Psychiatric:  The pt has {Desc; normal/abnormal:11317::"Normal"} affect.  Non-Invasive Vascular imaging   ABI (***) R:  ABI: *** (***),  PT: {Signals:19197::"none","mono","bi","tri"} DP: {Signals:19197::"none","mono","bi","tri"} TBI:  *** L:  ABI: *** (***),  PT: {Signals:19197::"none","mono","bi","tri"} DP: {Signals:19197::"none","mono","bi","tri"} TBI: ***  Aortoiliac Duplex (***)   *** Arterial Duplex (***)   Medical Decision Making   Paul Chen is a 83 y.o. male who presents for surveillance of PAD  Based on the patient's vascular studies, their ABIs are essentially unchanged since last visit. *** Arterial duplex *** The patient denies any claudication, rest pain, or tissue loss. They have palpable/nonpalpable pedal pulses with *** doppler signals They will continue their *** and follow up with our office in *** months/year with ABIs and ***   Loel Dubonnet PA-C [facility] Office: [phone]  Clinic MD: ***

## 2023-04-06 ENCOUNTER — Telehealth: Payer: Self-pay

## 2023-04-06 NOTE — Telephone Encounter (Signed)
 PA request received from [facility] for Nuzyra 150mg  tablets. PA submitted through covermymeds.  Paul Chen  (Key: [id number]) Rx #: [id number]

## 2023-04-12 ENCOUNTER — Ambulatory Visit: Payer: 59 | Admitting: Podiatry

## 2023-04-14 ENCOUNTER — Ambulatory Visit (INDEPENDENT_AMBULATORY_CARE_PROVIDER_SITE_OTHER): Admitting: Podiatry

## 2023-04-14 DIAGNOSIS — L89611 Pressure ulcer of right heel, stage 1: Secondary | ICD-10-CM | POA: Diagnosis not present

## 2023-04-14 DIAGNOSIS — L97522 Non-pressure chronic ulcer of other part of left foot with fat layer exposed: Secondary | ICD-10-CM

## 2023-04-14 DIAGNOSIS — E11621 Type 2 diabetes mellitus with foot ulcer: Secondary | ICD-10-CM

## 2023-04-16 NOTE — Progress Notes (Signed)
 Chief Complaint  Patient presents with   Diabetic Ulcer    Left great toe, ulcer is open. He is taking the ABX we prescribed. Right 3rd callous, and right heel, is tender, it looks like another ulcer is tring to come up. Last A1c 5.9. takes elliquis   HPI: 83 y.o. male presents today after being discharged from the hospital.  He was getting home health care prior to his hospital stay, but they had discontinued care due to his noncompliance at home as well as alcoholism.  Patient states that different home health care company has already started coming to his house and has been there once upon discharge from the hospital.  He notes that the back of his right heel is starting to hurt again.  When asked if the patient is doing what he can to be as healthy as possible so that these wounds can heal, he stated he is.  Prior to the patient being hospitalized we arranged for the patient to obtain Nuzyra antibiotic.  His daughter stop by the office when he was in the hospital and picked up 2 boxes for him to start with the medication while the prescription was sent to a special pharmacy to be sent to his home.  He notes that he did receive the prescription in the mail and is taking the antibiotic without any side effects.  Past Medical History:  Diagnosis Date   Arthritis    Chronic renal insufficiency    CKD (chronic kidney disease), stage III (HCC)    COPD (chronic obstructive pulmonary disease) (HCC)    with restriction   Coronary disease October 2010   Status post stenting of the proximal to mid right coronary with DES.  Status post extensive stenting of the proximal right coronary to the crux.  He has moderate disease in  the left coronary system   Depression    Diabetes mellitus    Insulin Dependent   Diastolic dysfunction    Congestive heart failure with   Dyspnea    multifactorial. chronic, 11/18/9- not an issue at this time   Gout    History of blood transfusion    History of  diabetic ulcer of foot    right   Hypertension    Hypoventilation    syndrome   Obesity    Pneumonia    Pulmonary embolism [facility]) March 2010   hx of and DVT in March of 2010 on chronic Coumadin therapy   Pulmonary hypertension (HCC)    Moderate to severe   Retroperitoneal bleed    Hx of right pelvic   Sleep apnea    Obstructive , no CPAP   Wears dentures     Past Surgical History:  Procedure Laterality Date   ABDOMINAL AORTOGRAM W/LOWER EXTREMITY Right 01/15/2018   Procedure: ABDOMINAL AORTOGRAM W/LOWER EXTREMITY;  Surgeon: Maeola Harman, MD;  Location: [facility] INVASIVE CV LAB;  Service: Cardiovascular;  Laterality: Right;   ABDOMINAL AORTOGRAM W/LOWER EXTREMITY Right 11/18/2019   Procedure: ABDOMINAL AORTOGRAM W/LOWER EXTREMITY;  Surgeon: Maeola Harman, MD;  Location: [facility] INVASIVE CV LAB;  Service: Cardiovascular;  Laterality: Right;   ABDOMINAL AORTOGRAM W/LOWER EXTREMITY Bilateral 02/20/2023   Procedure: ABDOMINAL AORTOGRAM W/LOWER EXTREMITY;  Surgeon: Maeola Harman, MD;  Location: [facility] INVASIVE CV LAB;  Service: Cardiovascular;  Laterality: Bilateral;   AMPUTATION TOE Left 05/04/2017   Procedure: Left hallux amputation;  Surgeon: Toni Arthurs, MD;  Location: [facility];  Service: Orthopedics;  Laterality: Left;   AMPUTATION TOE Right 12/21/2017   Procedure: Right hallux amputation;  Surgeon: Toni Arthurs, MD;  Location: [facility];  Service: Orthopedics;  Laterality: Right;    AMPUTATION TOE Right 02/15/2018   Procedure: Right 2nd toe amputation;  Surgeon: Toni Arthurs, MD;  Location: [facility] OR;  Service: Orthopedics;  Laterality: Right;  60   BACK SURGERY  2000   post   CARDIOVASCULAR STRESS TEST  11-14-2008   EF 44%   EXPLORATORY LAPAROTOMY  1972   POST   LOWER EXTREMITY ANGIOGRAPHY N/A 05/01/2017   Procedure: LOWER EXTREMITY ANGIOGRAPHY;  Surgeon: Maeola Harman, MD;  Location: [facility] INVASIVE CV LAB;  Service:  Cardiovascular;  Laterality: N/A;   LOWER EXTREMITY ANGIOGRAPHY N/A 05/15/2017   Procedure: LOWER EXTREMITY ANGIOGRAPHY;  Surgeon: Maeola Harman, MD;  Location: [facility] INVASIVE CV LAB;  Service: Cardiovascular;  Laterality: N/A;   MULTIPLE TOOTH EXTRACTIONS     PERIPHERAL VASCULAR ATHERECTOMY  05/01/2017   Procedure: PERIPHERAL VASCULAR ATHERECTOMY;  Surgeon: Maeola Harman, MD;  Location: [facility] INVASIVE CV LAB;  Service: Cardiovascular;;  Left SFA   PERIPHERAL VASCULAR ATHERECTOMY Right 11/18/2019   Procedure: PERIPHERAL VASCULAR ATHERECTOMY;  Surgeon: Maeola Harman, MD;  Location: [facility] INVASIVE CV LAB;  Service: Cardiovascular;  Laterality: Right;  SFA   PERIPHERAL VASCULAR BALLOON ANGIOPLASTY  05/01/2017   Procedure: PERIPHERAL VASCULAR BALLOON ANGIOPLASTY;  Surgeon: Maeola Harman, MD;  Location: [facility] INVASIVE CV LAB;  Service: Cardiovascular;;  Left Anterial Tibial Artery   PERIPHERAL VASCULAR BALLOON ANGIOPLASTY Right 01/15/2018   Procedure: PERIPHERAL VASCULAR BALLOON ANGIOPLASTY;  Surgeon: Maeola Harman, MD;  Location: [facility] INVASIVE CV LAB;  Service: Cardiovascular;  Laterality: Right;  SFA atherectomy   PERIPHERAL VASCULAR BALLOON ANGIOPLASTY Right 11/18/2019   Procedure: PERIPHERAL VASCULAR BALLOON ANGIOPLASTY;  Surgeon: Maeola Harman, MD;  Location: [facility] INVASIVE CV LAB;  Service: Cardiovascular;  Laterality: Right;  SFA   PERIPHERAL VASCULAR INTERVENTION Right 05/15/2017   Procedure: PERIPHERAL VASCULAR INTERVENTION;  Surgeon: Maeola Harman, MD;  Location: [facility] INVASIVE CV LAB;  Service: Cardiovascular;  Laterality: Right;  SFA Stent   PERIPHERAL VASCULAR INTERVENTION Left 02/20/2023   Procedure: PERIPHERAL VASCULAR INTERVENTION;  Surgeon: Maeola Harman, MD;  Location: [facility] INVASIVE CV LAB;  Service: Cardiovascular;  Laterality: Left;  Lt SFA   US ECHOCARDIOGRAPHY  11-25-2008   EF 55-60%    No Known Allergies     Physical Exam: Trace palpable pedal pulses.  There is slight bruising along the posterior aspect of the right heel without soft tissue breakdown.  There is pain on palpation of this area.  The right distal toe ulceration has resolved and a small eschar is present.  This is stable.  The left hallux ulceration is larger.  The erythema that was present prior to hospitalization has resolved.  There is a granular base but the wound is dirty.  No active drainage is noted.  This measures approximately 1.2 cm in diameter and 0.2 cm in depth.  This is full-thickness.  Assessment/Plan of Care: 1. 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 Ulcer of left foot, with fat layer exposed (HCC)   3. Pressure injury of right heel, stage 1     Discussed clinical findings with patient today.  The left hallux ulcer did not need debrided today.  This was scrubbed cleaned of debris.  Antibiotic ointment and a dry sterile dressing was applied.  He will need to  have the home health care that is currently coming to his home call us for any wound instructions.  This will involve daily cleansing of the wound with soap and water, dry the ulceration well.  Apply gentamicin ointment or Silvadene cream to the ulceration followed by a dry sterile gauze dressing.  Change once daily.  Right distal toe ulcer does not need home care at this time.  He does need to offload the posterior right heel with pillows or a foam heel lift boot.  Calmoseptic ointment applied to the area daily could also be beneficial.  This will need close inspection daily.  Follow-up in 2 to 3 weeks.  Will have him follow-up with Dr. Jamse Arn as he may be needing surgical intervention of the wound on the left hallux soon.   Clerance Lav, DPM, FACFAS [facility]     [address] [place], [place] [address]                Office [phone]  Fax [phone]

## 2023-04-19 NOTE — Progress Notes (Signed)
 Patient presents to the office today for diabetic shoe and insole measuring.  Patient was measured with brannock device to determine size and width for 1 pair of extra depth shoes and foam casted for 3 pair of insoles.   Documentation of medical necessity will be sent to patient's treating diabetic doctor to verify and sign.   Patient's diabetic provider: Lindwood Qua  Shoes and insoles will be ordered at that time and patient will be notified for an appointment for fitting when they arrive.   Shoe size (per patient): 9 Patient shoe selection- Shoe choice:   [id number] / [id number] Shoe size ordered: 9XWD   Financials signed

## 2023-04-25 ENCOUNTER — Encounter: Payer: Self-pay | Admitting: Podiatry

## 2023-04-25 ENCOUNTER — Ambulatory Visit (INDEPENDENT_AMBULATORY_CARE_PROVIDER_SITE_OTHER): Admitting: Podiatry

## 2023-04-25 DIAGNOSIS — L97522 Non-pressure chronic ulcer of other part of left foot with fat layer exposed: Secondary | ICD-10-CM

## 2023-04-25 DIAGNOSIS — E11621 Type 2 diabetes mellitus with foot ulcer: Secondary | ICD-10-CM

## 2023-04-25 NOTE — Progress Notes (Unsigned)
 Chief Complaint  Patient presents with   Wound Check    Wound check of the left great toe. Open ulcer that is healing, it looks better then it did last 2 weeks go. Last A1c was 6 on 04/09/23. Takes Elliquis.    HPI: 83 y.o. male presents today for follow-up evaluation of ulceration to left first toe, partial amputation site.  He believes it is doing pretty well.  He is ambulating in regular shoes.  He has been taking [place] and has nearly completed the course.   Past Medical History:  Diagnosis Date   Arthritis    Chronic renal insufficiency    CKD (chronic kidney disease), stage III (HCC)    COPD (chronic obstructive pulmonary disease) (HCC)    with restriction   Coronary disease October 2010   Status post stenting of the proximal to mid right coronary with DES.  Status post extensive stenting of the proximal right coronary to the crux.  He has moderate disease in  the left coronary system   Depression    Diabetes mellitus    Insulin Dependent   Diastolic dysfunction    Congestive heart failure with   Dyspnea    multifactorial. chronic, 11/18/9- not an issue at this time   Gout    History of blood transfusion    History of diabetic ulcer of foot    right   Hypertension    Hypoventilation    syndrome   Obesity    Pneumonia    Pulmonary embolism [facility]) March 2010   hx of and DVT in March of 2010 on chronic Coumadin therapy   Pulmonary hypertension (HCC)    Moderate to severe   Retroperitoneal bleed    Hx of right pelvic   Sleep apnea    Obstructive , no CPAP   Wears dentures     Past Surgical History:  Procedure Laterality Date   ABDOMINAL AORTOGRAM W/LOWER EXTREMITY Right 01/15/2018   Procedure: ABDOMINAL AORTOGRAM W/LOWER EXTREMITY;  Surgeon: Maeola Harman, MD;  Location: [facility] INVASIVE CV LAB;  Service: Cardiovascular;  Laterality: Right;   ABDOMINAL AORTOGRAM W/LOWER EXTREMITY Right 11/18/2019   Procedure: ABDOMINAL AORTOGRAM W/LOWER EXTREMITY;   Surgeon: Maeola Harman, MD;  Location: [facility] INVASIVE CV LAB;  Service: Cardiovascular;  Laterality: Right;   ABDOMINAL AORTOGRAM W/LOWER EXTREMITY Bilateral 02/20/2023   Procedure: ABDOMINAL AORTOGRAM W/LOWER EXTREMITY;  Surgeon: Maeola Harman, MD;  Location[facility] INVASIVE CV LAB;  Service: Cardiovascular;  Laterality: Bilateral;   AMPUTATION TOE Left 05/04/2017   Procedure: Left hallux amputation;  Surgeon: Toni Arthurs, MD;  Location: [facility];  Service: Orthopedics;  Laterality: Left;   AMPUTATION TOE Right 12/21/2017   Procedure: Right hallux amputation;  Surgeon: Toni Arthurs, MD;  Location: [facility];  Service: Orthopedics;  Laterality: Right;    AMPUTATION TOE Right 02/15/2018   Procedure: Right 2nd toe amputation;  Surgeon: Toni Arthurs, MD;  Location: [facility] OR;  Service: Orthopedics;  Laterality: Right;  60   BACK SURGERY  2000   post   CARDIOVASCULAR STRESS TEST  11-14-2008   EF 44%   EXPLORATORY LAPAROTOMY  1972   POST   LOWER EXTREMITY ANGIOGRAPHY N/A 05/01/2017   Procedure: LOWER EXTREMITY ANGIOGRAPHY;  Surgeon: Maeola Harman, MD;  Location: [facility] INVASIVE CV LAB;  Service: Cardiovascular;  Laterality: N/A;   LOWER EXTREMITY ANGIOGRAPHY N/A 05/15/2017   Procedure: LOWER EXTREMITY ANGIOGRAPHY;  Surgeon: Maeola Harman, MD;  Location: [facility] INVASIVE CV LAB;  Service: Cardiovascular;  Laterality: N/A;   MULTIPLE TOOTH EXTRACTIONS     PERIPHERAL VASCULAR ATHERECTOMY  05/01/2017   Procedure: PERIPHERAL VASCULAR ATHERECTOMY;  Surgeon: Maeola Harman, MD;  Location: [facility] INVASIVE CV LAB;  Service: Cardiovascular;;  Left SFA   PERIPHERAL VASCULAR ATHERECTOMY Right 11/18/2019   Procedure: PERIPHERAL VASCULAR ATHERECTOMY;  Surgeon: Maeola Harman, MD;  Location: [facility] INVASIVE CV LAB;  Service: Cardiovascular;  Laterality: Right;  SFA   PERIPHERAL VASCULAR BALLOON ANGIOPLASTY  05/01/2017   Procedure: PERIPHERAL  VASCULAR BALLOON ANGIOPLASTY;  Surgeon: Maeola Harman, MD;  Location: [facility] INVASIVE CV LAB;  Service: Cardiovascular;;  Left Anterial Tibial Artery   PERIPHERAL VASCULAR BALLOON ANGIOPLASTY Right 01/15/2018   Procedure: PERIPHERAL VASCULAR BALLOON ANGIOPLASTY;  Surgeon: Maeola Harman, MD;  Location: [facility] INVASIVE CV LAB;  Service: Cardiovascular;  Laterality: Right;  SFA atherectomy   PERIPHERAL VASCULAR BALLOON ANGIOPLASTY Right 11/18/2019   Procedure: PERIPHERAL VASCULAR BALLOON ANGIOPLASTY;  Surgeon: Maeola Harman, MD;  Location: [facility] INVASIVE CV LAB;  Service: Cardiovascular;  Laterality: Right;  SFA   PERIPHERAL VASCULAR INTERVENTION Right 05/15/2017   Procedure: PERIPHERAL VASCULAR INTERVENTION;  Surgeon: Maeola Harman, MD;  Location: [facility] INVASIVE CV LAB;  Service: Cardiovascular;  Laterality: Right;  SFA Stent   PERIPHERAL VASCULAR INTERVENTION Left 02/20/2023   Procedure: PERIPHERAL VASCULAR INTERVENTION;  Surgeon: Maeola Harman, MD;  Location: [facility] INVASIVE CV LAB;  Service: Cardiovascular;  Laterality: Left;  Lt SFA   US ECHOCARDIOGRAPHY  11-25-2008   EF 55-60%    No Known Allergies    Physical Exam: Trace palpable pedal pulses, PT pulses difficult to palpate.  Excoriations noted to legs bilaterally with skin changes consistent with chronic venous stasis.  Area right heel tenderness is previously noted appears improved.  No signs of ulceration to the right foot today.  The left first toe partial amputation site plantar ulceration and somewhat enlarged from previously noted measurements measuring 1.7 x 1.1 cm.  Wound bed does appear viable with granulation tissue to the base.  Regular borders.  No significant drainage.  No erythema.  No edema.  No signs of acute bacterial infection.  Assessment/Plan of Care: 1. Ulcer of left foot, with fat layer exposed (HCC)   2. 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  The left hallux ulcer did not need debrided today.  This 1 cleansed with wound cleanser today.  Silvadene cream and light Coban dressing was applied today.  Offloading felt padding applied.  Discussed need for limited stance and gait.  We will see how the ulceration response, may need more aggressive offloading or nonweightbearing, possibly more advanced wound care products.  Follow-up in 2 to 3 weeks.    Ulceration does appear stable overall but due to its location does increase risk of needing amputation of the remaining toe if this worsens.   Gracilyn Gunia L. Marchia Bond, AACFAS [facility]     [address] [place], [place] [address]                Office [phone]  Fax [phone]

## 2023-05-09 ENCOUNTER — Telehealth: Payer: Self-pay

## 2023-05-09 ENCOUNTER — Encounter: Payer: Self-pay | Admitting: Podiatry

## 2023-05-09 ENCOUNTER — Ambulatory Visit (INDEPENDENT_AMBULATORY_CARE_PROVIDER_SITE_OTHER): Admitting: Podiatry

## 2023-05-09 DIAGNOSIS — E1140 Type 2 diabetes mellitus with diabetic neuropathy, unspecified: Secondary | ICD-10-CM

## 2023-05-09 DIAGNOSIS — I739 Peripheral vascular disease, unspecified: Secondary | ICD-10-CM | POA: Diagnosis not present

## 2023-05-09 DIAGNOSIS — M2142 Flat foot [pes planus] (acquired), left foot: Secondary | ICD-10-CM | POA: Diagnosis not present

## 2023-05-09 DIAGNOSIS — L97411 Non-pressure chronic ulcer of right heel and midfoot limited to breakdown of skin: Secondary | ICD-10-CM

## 2023-05-09 DIAGNOSIS — L97522 Non-pressure chronic ulcer of other part of left foot with fat layer exposed: Secondary | ICD-10-CM

## 2023-05-09 DIAGNOSIS — M2041 Other hammer toe(s) (acquired), right foot: Secondary | ICD-10-CM

## 2023-05-09 DIAGNOSIS — M2141 Flat foot [pes planus] (acquired), right foot: Secondary | ICD-10-CM | POA: Diagnosis not present

## 2023-05-09 MED ORDER — MUPIROCIN 2 % EX OINT
1.0000 | TOPICAL_OINTMENT | Freq: Two times a day (BID) | CUTANEOUS | 0 refills | Status: DC
Start: 2023-05-09 — End: 2023-05-23

## 2023-05-09 NOTE — Progress Notes (Unsigned)
 Chief Complaint  Patient presents with   Wound Check    Wound care today for the left great toe ulcer. The center is still open and his toe is white. His right heel is a little pink and he states that it has been hurting.  I did reaffirm the importance of not walking around barefoot at home due to germs getting into the wound.    HPI: 83 y.o. male presents today for follow-up evaluation of ulceration to left first toe, partial amputation site.  He thinks it is doing about the same.  Some compliance issues as described above.  He is getting diabetic shoes today.  He also states that his right heel has been sore.   Past Medical History:  Diagnosis Date   Arthritis    Chronic renal insufficiency    CKD (chronic kidney disease), stage III (HCC)    COPD (chronic obstructive pulmonary disease) (HCC)    with restriction   Coronary disease October 2010   Status post stenting of the proximal to mid right coronary with DES.  Status post extensive stenting of the proximal right coronary to the crux.  He has moderate disease in  the left coronary system   Depression    Diabetes mellitus    Insulin Dependent   Diastolic dysfunction    Congestive heart failure with   Dyspnea    multifactorial. chronic, 11/18/9- not an issue at this time   Gout    History of blood transfusion    History of diabetic ulcer of foot    right   Hypertension    Hypoventilation    syndrome   Obesity    Pneumonia    Pulmonary embolism [facility]) March 2010   hx of and DVT in March of 2010 on chronic Coumadin therapy   Pulmonary hypertension (HCC)    Moderate to severe   Retroperitoneal bleed    Hx of right pelvic   Sleep apnea    Obstructive , no CPAP   Wears dentures     Past Surgical History:  Procedure Laterality Date   ABDOMINAL AORTOGRAM W/LOWER EXTREMITY Right 01/15/2018   Procedure: ABDOMINAL AORTOGRAM W/LOWER EXTREMITY;  Surgeon: Maeola Harman, MD;  Location: [facility] INVASIVE CV LAB;   Service: Cardiovascular;  Laterality: Right;   ABDOMINAL AORTOGRAM W/LOWER EXTREMITY Right 11/18/2019   Procedure: ABDOMINAL AORTOGRAM W/LOWER EXTREMITY;  Surgeon: Maeola Harman, MD;  Location: [facility] INVASIVE CV LAB;  Service: Cardiovascular;  Laterality: Right;   ABDOMINAL AORTOGRAM W/LOWER EXTREMITY Bilateral 02/20/2023   Procedure: ABDOMINAL AORTOGRAM W/LOWER EXTREMITY;  Surgeon: Maeola Harman, MD;  Location: [facility] INVASIVE CV LAB;  Service: Cardiovascular;  Laterality: Bilateral;   AMPUTATION TOE Left 05/04/2017   Procedure: Left hallux amputation;  Surgeon: Toni Arthurs, MD;  Location: [facility];  Service: Orthopedics;  Laterality: Left;   AMPUTATION TOE Right 12/21/2017   Procedure: Right hallux amputation;  Surgeon: Toni Arthurs, MD;  Location: [facility];  Service: Orthopedics;  Laterality: Right;    AMPUTATION TOE Right 02/15/2018   Procedure: Right 2nd toe amputation;  Surgeon: Toni Arthurs, MD;  Location: [facility];  Service: Orthopedics;  Laterality: Right;  60   BACK SURGERY  2000   post   CARDIOVASCULAR STRESS TEST  11-14-2008   EF 44%   EXPLORATORY LAPAROTOMY  1972   POST   LOWER EXTREMITY ANGIOGRAPHY N/A 05/01/2017   Procedure: LOWER EXTREMITY ANGIOGRAPHY;  Surgeon: Maeola Harman, MD;  Location: [facility] INVASIVE CV LAB;  Service: Cardiovascular;  Laterality: N/A;   LOWER EXTREMITY ANGIOGRAPHY N/A 05/15/2017   Procedure: LOWER EXTREMITY ANGIOGRAPHY;  Surgeon: Maeola Harman, MD;  Location: [facility] INVASIVE CV LAB;  Service: Cardiovascular;  Laterality: N/A;   MULTIPLE TOOTH EXTRACTIONS     PERIPHERAL VASCULAR ATHERECTOMY  05/01/2017   Procedure: PERIPHERAL VASCULAR ATHERECTOMY;  Surgeon: Maeola Harman, MD;  Location: [facility] INVASIVE CV LAB;  Service: Cardiovascular;;  Left SFA   PERIPHERAL VASCULAR ATHERECTOMY Right 11/18/2019   Procedure: PERIPHERAL VASCULAR ATHERECTOMY;  Surgeon: Maeola Harman, MD;   Location: [facility] INVASIVE CV LAB;  Service: Cardiovascular;  Laterality: Right;  SFA   PERIPHERAL VASCULAR BALLOON ANGIOPLASTY  05/01/2017   Procedure: PERIPHERAL VASCULAR BALLOON ANGIOPLASTY;  Surgeon: Maeola Harman, MD;  Location: [facility] INVASIVE CV LAB;  Service: Cardiovascular;;  Left Anterial Tibial Artery   PERIPHERAL VASCULAR BALLOON ANGIOPLASTY Right 01/15/2018   Procedure: PERIPHERAL VASCULAR BALLOON ANGIOPLASTY;  Surgeon: Maeola Harman, MD;  Location: [facility] INVASIVE CV LAB;  Service: Cardiovascular;  Laterality: Right;  SFA atherectomy   PERIPHERAL VASCULAR BALLOON ANGIOPLASTY Right 11/18/2019   Procedure: PERIPHERAL VASCULAR BALLOON ANGIOPLASTY;  Surgeon: Maeola Harman, MD;  Location: [facility] INVASIVE CV LAB;  Service: Cardiovascular;  Laterality: Right;  SFA   PERIPHERAL VASCULAR INTERVENTION Right 05/15/2017   Procedure: PERIPHERAL VASCULAR INTERVENTION;  Surgeon: Maeola Harman, MD;  Location: [facility] INVASIVE CV LAB;  Service: Cardiovascular;  Laterality: Right;  SFA Stent   PERIPHERAL VASCULAR INTERVENTION Left 02/20/2023   Procedure: PERIPHERAL VASCULAR INTERVENTION;  Surgeon: Maeola Harman, MD;  Location: [facility] INVASIVE CV LAB;  Service: Cardiovascular;  Laterality: Left;  Lt SFA   US ECHOCARDIOGRAPHY  11-25-2008   EF 55-60%    No Known Allergies    Physical Exam: Trace palpable pedal pulses, PT pulses difficult to palpate.  Excoriations noted to legs bilaterally with skin changes consistent with chronic venous stasis.  Area of right heel tenderness does appear to have worsened today with some focal areas of partial-thickness skin breakdown with some localized redness.   The left first toe partial amputation site plantar ulceration is relatively unchanged measuring 1.5 x 1 cm.  Wound bed does appear viable with granulation tissue to the base.  Regular borders.  Of note there is minimal dorsiflexion available to the left first toe amputation stump.   No significant drainage.  No erythema.  No edema.  No signs of acute bacterial infection.  Assessment/Plan of Care: 1. Ulcer of right heel and midfoot, limited to breakdown of skin (HCC)   2. Ulcer of left foot, with fat layer exposed (HCC)   3. Type 2 diabetes mellitus with diabetic neuropathy, unspecified whether long term insulin use (HCC)   4. PAD (peripheral artery disease) (HCC)     Discussed clinical findings with patient today.  The left hallux ulcer did not need debrided today.  This 1 cleansed with wound cleanser today.  Silvadene cream and light Coban dressing was applied today.  Offloading felt padding applied.  Discussed need for limited stance and gait.  Patient is getting his diabetic shoes today, however dispensing surgical shoe to the left foot today to rule out all times when out of bed.  Monitor right heel for signs of ulceration and skin breakdown.  Mupirocin and bordered foam dressing applied today.  Continue local wound care left first toe with daily dressing changes with mupirocin.  Follow-up in 2 to 3 weeks.    Ulceration does appear  stable overall but due to its location does increase risk of needing amputation of the remaining toe if this worsens.   Finlee Milo L. Marchia Bond, AACFAS [facility]     [address] [place], [place] [address]                Office [phone]  Fax [phone]

## 2023-05-09 NOTE — Telephone Encounter (Signed)
 Sending shoes to [employer]

## 2023-05-10 NOTE — Progress Notes (Unsigned)

## 2023-05-23 ENCOUNTER — Ambulatory Visit (INDEPENDENT_AMBULATORY_CARE_PROVIDER_SITE_OTHER): Admitting: Podiatry

## 2023-05-23 DIAGNOSIS — S91209A Unspecified open wound of unspecified toe(s) with damage to nail, initial encounter: Secondary | ICD-10-CM | POA: Diagnosis not present

## 2023-05-23 DIAGNOSIS — L97522 Non-pressure chronic ulcer of other part of left foot with fat layer exposed: Secondary | ICD-10-CM | POA: Diagnosis not present

## 2023-05-23 DIAGNOSIS — L97411 Non-pressure chronic ulcer of right heel and midfoot limited to breakdown of skin: Secondary | ICD-10-CM

## 2023-05-23 MED ORDER — DOXYCYCLINE HYCLATE 100 MG PO TABS: 100.0000 mg | ORAL_TABLET | Freq: Two times a day (BID) | ORAL | 0 refills | Status: AC

## 2023-05-23 MED ORDER — MUPIROCIN 2 % EX OINT: 1.0000 | TOPICAL_OINTMENT | Freq: Two times a day (BID) | CUTANEOUS | 0 refills | Status: AC

## 2023-05-23 NOTE — Progress Notes (Unsigned)
 Chief Complaint  Patient presents with   Wound Check    Here today for wound care to the left great toe, looks like there is some new skin growing.  His left second toe nail is gone. He tried to cut the nail and ended up ripping the nail off. Last A1c was 6 a month ago.  Takes elliquis.   HPI: 83 y.o. male presents today for follow-up evaluation of ulceration to left first toe, partial amputation site.  He thinks it is doing about the same.  Recently had diabetic shoes dispensed.  Right heel doing better.   Past Medical History:  Diagnosis Date   Arthritis    Chronic renal insufficiency    CKD (chronic kidney disease), stage III (HCC)    COPD (chronic obstructive pulmonary disease) (HCC)    with restriction   Coronary disease October 2010   Status post stenting of the proximal to mid right coronary with DES.  Status post extensive stenting of the proximal right coronary to the crux.  He has moderate disease in  the left coronary system   Depression    Diabetes mellitus    Insulin  Dependent   Diastolic dysfunction    Congestive heart failure with   Dyspnea    multifactorial. chronic, 11/18/9- not an issue at this time   Gout    History of blood transfusion    History of diabetic ulcer of foot    right   Hypertension    Hypoventilation    syndrome   Obesity    Pneumonia    Pulmonary embolism [facility]) March 2010   hx of and DVT in March of 2010 on chronic Coumadin  therapy   Pulmonary hypertension (HCC)    Moderate to severe   Retroperitoneal bleed    Hx of right pelvic   Sleep apnea    Obstructive , no CPAP   Wears dentures     Past Surgical History:  Procedure Laterality Date   ABDOMINAL AORTOGRAM W/LOWER EXTREMITY Right 01/15/2018   Procedure: ABDOMINAL AORTOGRAM W/LOWER EXTREMITY;  Surgeon: Adine Hoof, MD;  Location: [facility] INVASIVE CV LAB;  Service: Cardiovascular;  Laterality: Right;   ABDOMINAL AORTOGRAM W/LOWER EXTREMITY Right 11/18/2019    Procedure: ABDOMINAL AORTOGRAM W/LOWER EXTREMITY;  Surgeon: Adine Hoof, MD;  Location: [facility] INVASIVE CV LAB;  Service: Cardiovascular;  Laterality: Right;   ABDOMINAL AORTOGRAM W/LOWER EXTREMITY Bilateral 02/20/2023   Procedure: ABDOMINAL AORTOGRAM W/LOWER EXTREMITY;  Surgeon: Adine Hoof, MD;  Location: [facility] INVASIVE CV LAB;  Service: Cardiovascular;  Laterality: Bilateral;   AMPUTATION TOE Left 05/04/2017   Procedure: Left hallux amputation;  Surgeon: Amada Backer, MD;  Location: [facility];  Service: Orthopedics;  Laterality: Left;   AMPUTATION TOE Right 12/21/2017   Procedure: Right hallux amputation;  Surgeon: Amada Backer, MD;  Location: [facility];  Service: Orthopedics;  Laterality: Right;    AMPUTATION TOE Right 02/15/2018   Procedure: Right 2nd toe amputation;  Surgeon: Amada Backer, MD;  Location: [facility] OR;  Service: Orthopedics;  Laterality: Right;  60   BACK SURGERY  2000   post   CARDIOVASCULAR STRESS TEST  11-14-2008   EF 44%   EXPLORATORY LAPAROTOMY  1972   POST   LOWER EXTREMITY ANGIOGRAPHY N/A 05/01/2017   Procedure: LOWER EXTREMITY ANGIOGRAPHY;  Surgeon: Adine Hoof, MD;  Location: [facility] INVASIVE CV LAB;  Service: Cardiovascular;  Laterality: N/A;   LOWER EXTREMITY ANGIOGRAPHY N/A 05/15/2017  Procedure: LOWER EXTREMITY ANGIOGRAPHY;  Surgeon: Adine Hoof, MD;  Location: [facility] INVASIVE CV LAB;  Service: Cardiovascular;  Laterality: N/A;   MULTIPLE TOOTH EXTRACTIONS     PERIPHERAL VASCULAR ATHERECTOMY  05/01/2017   Procedure: PERIPHERAL VASCULAR ATHERECTOMY;  Surgeon: Adine Hoof, MD;  Location: [facility] INVASIVE CV LAB;  Service: Cardiovascular;;  Left SFA   PERIPHERAL VASCULAR ATHERECTOMY Right 11/18/2019   Procedure: PERIPHERAL VASCULAR ATHERECTOMY;  Surgeon: Adine Hoof, MD;  Location: [facility] INVASIVE CV LAB;  Service: Cardiovascular;  Laterality: Right;  SFA   PERIPHERAL VASCULAR  BALLOON ANGIOPLASTY  05/01/2017   Procedure: PERIPHERAL VASCULAR BALLOON ANGIOPLASTY;  Surgeon: Adine Hoof, MD;  Location: [facility] INVASIVE CV LAB;  Service: Cardiovascular;;  Left Anterial Tibial Artery   PERIPHERAL VASCULAR BALLOON ANGIOPLASTY Right 01/15/2018   Procedure: PERIPHERAL VASCULAR BALLOON ANGIOPLASTY;  Surgeon: Adine Hoof, MD;  Location: [facility] INVASIVE CV LAB;  Service: Cardiovascular;  Laterality: Right;  SFA atherectomy   PERIPHERAL VASCULAR BALLOON ANGIOPLASTY Right 11/18/2019   Procedure: PERIPHERAL VASCULAR BALLOON ANGIOPLASTY;  Surgeon: Adine Hoof, MD;  Location: [facility] INVASIVE CV LAB;  Service: Cardiovascular;  Laterality: Right;  SFA   PERIPHERAL VASCULAR INTERVENTION Right 05/15/2017   Procedure: PERIPHERAL VASCULAR INTERVENTION;  Surgeon: Adine Hoof, MD;  Location: [facility] INVASIVE CV LAB;  Service: Cardiovascular;  Laterality: Right;  SFA Stent   PERIPHERAL VASCULAR INTERVENTION Left 02/20/2023   Procedure: PERIPHERAL VASCULAR INTERVENTION;  Surgeon: Adine Hoof, MD;  Location: [facility] INVASIVE CV LAB;  Service: Cardiovascular;  Laterality: Left;  Lt SFA   US  ECHOCARDIOGRAPHY  11-25-2008   EF 55-60%    No Known Allergies    Physical Exam: Trace palpable pedal pulses, PT pulses difficult to palpate.  Excoriations noted to legs bilaterally with skin changes consistent with chronic venous stasis.  Right heel tenderness and localized redness appears improved today.  The left first toe partial amputation site plantar ulceration wound margins contracting slightly measuring 1.3 x 0.9 x 0.3 cm predebridement with epiboly noted, 1.5 x 1.1 x 0.3 cm postdebridement.  Wound bed does appear viable with granulation tissue to the base.  Of note there is minimal dorsiflexion available to the left first toe amputation stump.  No significant drainage.  No erythema.  No edema.  No signs of acute bacterial infection.  There is traumatic nail  avulsion to left second toenail with some bleeding tissue, some localized redness here.  Assessment/Plan of Care: 1. Ulcer of left foot, with fat layer exposed (HCC)   2. Toenail avulsion, initial encounter   3. Ulcer of right heel and midfoot, limited to breakdown of skin [facility])     Discussed clinical findings with patient today.  Ulcer excisionally debrided today using 312 scalpel blade to bleeding wound edges.  Patient tolerated this well, no anesthesia was necessary.  Hemostasis achieved with compression.  Aquacel Ag bandage applied.  Antibiotic bandage applied to left second toe.  Course of doxycycline  sent to patient's pharmacy out of abundance of caution, 7 days 100 mg twice daily.  Mupirocin  sent to patient's pharmacy to be applied to the second toe.  Ordering wound care supplies for Aquacel Ag to be applied to the first toe amputation site every 2 to 3 days.  Felt offloading padding applied to the first toe amputation site.  Practice limited stance and gait.  Follow-up in 2 to 3 weeks.    Ulceration does appear stable overall but due to its location does increase risk of needing amputation  of the remaining toe if this worsens.   Deonte Otting L. Lunda Salines, AACFAS [facility]     [address] [place], [place] [address]                Office [phone]  Fax [phone]

## 2023-05-24 ENCOUNTER — Encounter: Payer: Self-pay | Admitting: Podiatry

## 2023-05-26 ENCOUNTER — Telehealth: Payer: Self-pay

## 2023-05-26 NOTE — Telephone Encounter (Signed)
 Order form, office note and demographics faxed to [facility] 

## 2023-05-26 NOTE — Telephone Encounter (Signed)
-----   Message from Reina Cara sent at 05/24/2023  3:05 PM EDT ----- Regarding: Wound care supplies Hello, need to place wound care supplies for patient's left first toe amputation site plantar ulceration.  Wound cleanser, Aquacel Ag, large Band-Aid/secondary dressing to be changed every 2 to 3 days  1.5 x 1.1 x 0.3 cm debrided on 05/23/2023  Thank you!

## 2023-06-13 ENCOUNTER — Encounter: Payer: Self-pay | Admitting: Podiatry

## 2023-06-13 ENCOUNTER — Ambulatory Visit (INDEPENDENT_AMBULATORY_CARE_PROVIDER_SITE_OTHER): Admitting: Podiatry

## 2023-06-13 DIAGNOSIS — L97421 Non-pressure chronic ulcer of left heel and midfoot limited to breakdown of skin: Secondary | ICD-10-CM

## 2023-06-13 DIAGNOSIS — E1140 Type 2 diabetes mellitus with diabetic neuropathy, unspecified: Secondary | ICD-10-CM

## 2023-06-13 DIAGNOSIS — I739 Peripheral vascular disease, unspecified: Secondary | ICD-10-CM

## 2023-06-13 DIAGNOSIS — L97522 Non-pressure chronic ulcer of other part of left foot with fat layer exposed: Secondary | ICD-10-CM

## 2023-06-13 DIAGNOSIS — L03116 Cellulitis of left lower limb: Secondary | ICD-10-CM

## 2023-06-13 MED ORDER — AMOXICILLIN-POT CLAVULANATE 875-125 MG PO TABS
1.0000 | ORAL_TABLET | Freq: Two times a day (BID) | ORAL | 0 refills | Status: AC
Start: 2023-06-13 — End: ?

## 2023-06-13 MED ORDER — SILVER SULFADIAZINE 1 % EX CREA
1.0000 | TOPICAL_CREAM | Freq: Every day | CUTANEOUS | 0 refills | Status: AC
Start: 2023-06-13 — End: ?

## 2023-06-13 NOTE — Progress Notes (Unsigned)
 Chief Complaint  Patient presents with   Wound Check    Here today for wound check to the left foot. 1: left first toe ulcer looks like it is heeling some with callous around the open area. 2: NEW area on the heel. He noticed a piece of skin hanging and thought it was a small blister, yesterday. It is the entire width of his heel and the skin around it is white. Last A1c was 6.0 in March takes elliquis   HPI: 83 y.o. male presents today for follow-up evaluation of ulceration to left first toe, partial amputation site.  He presents with new large deroofed blister to the left heel.  He first noticed it just 2 to 3 days ago.  States it is painful and red.   Past Medical History:  Diagnosis Date   Arthritis    Chronic renal insufficiency    CKD (chronic kidney disease), stage III (HCC)    COPD (chronic obstructive pulmonary disease) (HCC)    with restriction   Coronary disease October 2010   Status post stenting of the proximal to mid right coronary with DES.  Status post extensive stenting of the proximal right coronary to the crux.  He has moderate disease in  the left coronary system   Depression    Diabetes mellitus    Insulin  Dependent   Diastolic dysfunction    Congestive heart failure with   Dyspnea    multifactorial. chronic, 11/18/9- not an issue at this time   Gout    History of blood transfusion    History of diabetic ulcer of foot    right   Hypertension    Hypoventilation    syndrome   Obesity    Pneumonia    Pulmonary embolism [facility]) March 2010   hx of and DVT in March of 2010 on chronic Coumadin  therapy   Pulmonary hypertension (HCC)    Moderate to severe   Retroperitoneal bleed    Hx of right pelvic   Sleep apnea    Obstructive , no CPAP   Wears dentures     Past Surgical History:  Procedure Laterality Date   ABDOMINAL AORTOGRAM W/LOWER EXTREMITY Right 01/15/2018   Procedure: ABDOMINAL AORTOGRAM W/LOWER EXTREMITY;  Surgeon: Adine Hoof,  MD;  Location: [facility] INVASIVE CV LAB;  Service: Cardiovascular;  Laterality: Right;   ABDOMINAL AORTOGRAM W/LOWER EXTREMITY Right 11/18/2019   Procedure: ABDOMINAL AORTOGRAM W/LOWER EXTREMITY;  Surgeon: Adine Hoof, MD;  Location: [facility] INVASIVE CV LAB;  Service: Cardiovascular;  Laterality: Right;   ABDOMINAL AORTOGRAM W/LOWER EXTREMITY Bilateral 02/20/2023   Procedure: ABDOMINAL AORTOGRAM W/LOWER EXTREMITY;  Surgeon: Adine Hoof, MD;  Location: [facility] INVASIVE CV LAB;  Service: Cardiovascular;  Laterality: Bilateral;   AMPUTATION TOE Left 05/04/2017   Procedure: Left hallux amputation;  Surgeon: Amada Backer, MD;  Location: [facility];  Service: Orthopedics;  Laterality: Left;   AMPUTATION TOE Right 12/21/2017   Procedure: Right hallux amputation;  Surgeon: Amada Backer, MD;  Location: [facility];  Service: Orthopedics;  Laterality: Right;    AMPUTATION TOE Right 02/15/2018   Procedure: Right 2nd toe amputation;  Surgeon: Amada Backer, MD;  Location: [facility] OR;  Service: Orthopedics;  Laterality: Right;  60   BACK SURGERY  2000   post   CARDIOVASCULAR STRESS TEST  11-14-2008   EF 44%   EXPLORATORY LAPAROTOMY  1972   POST   LOWER EXTREMITY ANGIOGRAPHY N/A 05/01/2017  Procedure: LOWER EXTREMITY ANGIOGRAPHY;  Surgeon: Adine Hoof, MD;  Location: [facility] INVASIVE CV LAB;  Service: Cardiovascular;  Laterality: N/A;   LOWER EXTREMITY ANGIOGRAPHY N/A 05/15/2017   Procedure: LOWER EXTREMITY ANGIOGRAPHY;  Surgeon: Adine Hoof, MD;  Location: [facility] INVASIVE CV LAB;  Service: Cardiovascular;  Laterality: N/A;   MULTIPLE TOOTH EXTRACTIONS     PERIPHERAL VASCULAR ATHERECTOMY  05/01/2017   Procedure: PERIPHERAL VASCULAR ATHERECTOMY;  Surgeon: Adine Hoof, MD;  Location: [facility] INVASIVE CV LAB;  Service: Cardiovascular;;  Left SFA   PERIPHERAL VASCULAR ATHERECTOMY Right 11/18/2019   Procedure: PERIPHERAL VASCULAR ATHERECTOMY;   Surgeon: Adine Hoof, MD;  Location: [facility] INVASIVE CV LAB;  Service: Cardiovascular;  Laterality: Right;  SFA   PERIPHERAL VASCULAR BALLOON ANGIOPLASTY  05/01/2017   Procedure: PERIPHERAL VASCULAR BALLOON ANGIOPLASTY;  Surgeon: Adine Hoof, MD;  Location: [facility] INVASIVE CV LAB;  Service: Cardiovascular;;  Left Anterial Tibial Artery   PERIPHERAL VASCULAR BALLOON ANGIOPLASTY Right 01/15/2018   Procedure: PERIPHERAL VASCULAR BALLOON ANGIOPLASTY;  Surgeon: Adine Hoof, MD;  Location: [facility] INVASIVE CV LAB;  Service: Cardiovascular;  Laterality: Right;  SFA atherectomy   PERIPHERAL VASCULAR BALLOON ANGIOPLASTY Right 11/18/2019   Procedure: PERIPHERAL VASCULAR BALLOON ANGIOPLASTY;  Surgeon: Adine Hoof, MD;  Location: [facility] INVASIVE CV LAB;  Service: Cardiovascular;  Laterality: Right;  SFA   PERIPHERAL VASCULAR INTERVENTION Right 05/15/2017   Procedure: PERIPHERAL VASCULAR INTERVENTION;  Surgeon: Adine Hoof, MD;  Location: [facility] INVASIVE CV LAB;  Service: Cardiovascular;  Laterality: Right;  SFA Stent   PERIPHERAL VASCULAR INTERVENTION Left 02/20/2023   Procedure: PERIPHERAL VASCULAR INTERVENTION;  Surgeon: Adine Hoof, MD;  Location: [facility] INVASIVE CV LAB;  Service: Cardiovascular;  Laterality: Left;  Lt SFA   US  ECHOCARDIOGRAPHY  11-25-2008   EF 55-60%    No Known Allergies    Physical Exam: Trace palpable pedal pulses, PT pulses difficult to palpate.  Excoriations noted to legs bilaterally with skin changes consistent with chronic venous stasis.  New ulcer limited to partial-thickness skin breakdown right heel measuring 8 x 4.5 cm.  Wound bed is erythematous with loosely adhered skin circumferentially.  Generalized redness and swelling to the left foot.  The left first toe partial amputation site plantar ulceration wound margins contracting slightly measuring 1.2 x 0.8 x 0.3 cm, predominantly granulation tissue, debrided to bleeding  wound base.  Minimal dorsiflexion available to the left first toe amputation stump.  Left hallux ulceration appears stable.   Assessment/Plan of Care: 1. Cellulitis of left foot   2. Ulcer of left foot, with fat layer exposed (HCC)   3. Type 2 diabetes mellitus with diabetic neuropathy, unspecified whether long term insulin  use (HCC)   4. Ulcer of left heel, limited to breakdown of skin [facility])     Discussed clinical findings with patient today.  New problem: left heel deroofed blister with partial thickness skin breakdown and associated cellulitis -Left heel debrided loosely adhered skin circumferentially around wound.  Wound bed very sensitive.  Partial-thickness breakdown of skin.  Xeroform and DSD dressing applied. - Augmentin sent to patient's pharmacy due to erythema about heel.  10-day course. -I certify that this diagnosis represents a distinct and separate diagnosis that requires evaluation and treatment separate from other procedures or diagnosis   Ulcer excisionally debrided today using 312 scalpel blade to bleeding wound bed.  Patient tolerated this well, no anesthesia was necessary.  Hemostasis achieved with compression.  Silvadene bandage applied.    Silvadene sent to  patient's pharmacy to apply to the left hallux amputation site.  Ordering wound care supplies for Xeroform dressing to be applied to the left heel daily to every other day, continue daily dressing changes with Silvadene to the first toe.  Felt offloading padding applied to the first toe amputation site.  Practice limited stance and gait.  Follow-up in 2 weeks.    Left hallux ulcer stable appearing, has slowly been improving.  If this plateaus can consider medial band plantar fasciotomy.   Merlinda Wrubel L. Lunda Salines, AACFAS [facility]     [address] [place], [place] [address]                Office [phone]  Fax [phone]

## 2023-06-22 ENCOUNTER — Telehealth: Payer: Self-pay | Admitting: Podiatry

## 2023-06-22 NOTE — Telephone Encounter (Signed)
 Pt is requesting to have a referral for a Home Hlth Agency to come do is wound changes. He is unable to reach to do them himself.

## 2023-06-30 ENCOUNTER — Ambulatory Visit (INDEPENDENT_AMBULATORY_CARE_PROVIDER_SITE_OTHER): Admitting: Podiatry

## 2023-06-30 DIAGNOSIS — E11621 Type 2 diabetes mellitus with foot ulcer: Secondary | ICD-10-CM | POA: Diagnosis not present

## 2023-06-30 DIAGNOSIS — L97522 Non-pressure chronic ulcer of other part of left foot with fat layer exposed: Secondary | ICD-10-CM

## 2023-06-30 NOTE — Progress Notes (Signed)
 Chief Complaint  Patient presents with   Wound Check    Left heel and first toe. The heel is looking better and the toe is still open but does not look as angry today. Last A1c 5.9 and takes eliquis.   HPI: 83 y.o. male presenting today for follow-up of left hallux ulcer.  Patient had called the office since last seen requesting a different home health care agency come out.  The last 1 I discharged him from their care due to alcoholism and noncompliance and patient being argumentative in the home.  When the patient was asked if he knew why they stopped coming to his home, he stated he did not.  When he was asked if he likes to drink alcohol, he states he only has 1-2 drinks per week and does not have an issue with alcoholism.  Our medical assistant did speak to the home health care representative from the patient's home and heard yelling in the background by the patient during that phone call.  The patient states that he has been compliant.  He notes that he developed a large blister on his left heel.  He states that this is dried up and is not having any issues with the heel at this time.  Past Medical History:  Diagnosis Date   Arthritis    Chronic renal insufficiency    CKD (chronic kidney disease), stage III (HCC)    COPD (chronic obstructive pulmonary disease) (HCC)    with restriction   Coronary disease October 2010   Status post stenting of the proximal to mid right coronary with DES.  Status post extensive stenting of the proximal right coronary to the crux.  He has moderate disease in  the left coronary system   Depression    Diabetes mellitus    Insulin  Dependent   Diastolic dysfunction    Congestive heart failure with   Dyspnea    multifactorial. chronic, 11/18/9- not an issue at this time   Gout    History of blood transfusion    History of diabetic ulcer of foot    right   Hypertension    Hypoventilation    syndrome   Obesity    Pneumonia    Pulmonary embolism [facility])  March 2010   hx of and DVT in March of 2010 on chronic Coumadin  therapy   Pulmonary hypertension (HCC)    Moderate to severe   Retroperitoneal bleed    Hx of right pelvic   Sleep apnea    Obstructive , no CPAP   Wears dentures    Past Surgical History:  Procedure Laterality Date   ABDOMINAL AORTOGRAM W/LOWER EXTREMITY Right 01/15/2018   Procedure: ABDOMINAL AORTOGRAM W/LOWER EXTREMITY;  Surgeon: Adine Hoof, MD;  Location: [facility] INVASIVE CV LAB;  Service: Cardiovascular;  Laterality: Right;   ABDOMINAL AORTOGRAM W/LOWER EXTREMITY Right 11/18/2019   Procedure: ABDOMINAL AORTOGRAM W/LOWER EXTREMITY;  Surgeon: Adine Hoof, MD;  Location: [facility] INVASIVE CV LAB;  Service: Cardiovascular;  Laterality: Right;   ABDOMINAL AORTOGRAM W/LOWER EXTREMITY Bilateral 02/20/2023   Procedure: ABDOMINAL AORTOGRAM W/LOWER EXTREMITY;  Surgeon: Adine Hoof, MD;  Location: [facility] INVASIVE CV LAB;  Service: Cardiovascular;  Laterality: Bilateral;   AMPUTATION TOE Left 05/04/2017   Procedure: Left hallux amputation;  Surgeon: Amada Backer, MD;  Location: [facility];  Service: Orthopedics;  Laterality: Left;   AMPUTATION TOE Right 12/21/2017   Procedure: Right hallux amputation;  Surgeon: Amada Backer, MD;  Location: [facility];  Service: Orthopedics;  Laterality: Right;    AMPUTATION TOE Right 02/15/2018   Procedure: Right 2nd toe amputation;  Surgeon: Amada Backer, MD;  Location: [facility] OR;  Service: Orthopedics;  Laterality: Right;  60   BACK SURGERY  2000   post   CARDIOVASCULAR STRESS TEST  11-14-2008   EF 44%   EXPLORATORY LAPAROTOMY  1972   POST   LOWER EXTREMITY ANGIOGRAPHY N/A 05/01/2017   Procedure: LOWER EXTREMITY ANGIOGRAPHY;  Surgeon: Adine Hoof, MD;  Location: [facility] INVASIVE CV LAB;  Service: Cardiovascular;  Laterality: N/A;   LOWER EXTREMITY ANGIOGRAPHY N/A 05/15/2017   Procedure: LOWER EXTREMITY ANGIOGRAPHY;  Surgeon: Adine Hoof, MD;  Location: [facility] INVASIVE CV LAB;  Service: Cardiovascular;  Laterality: N/A;   MULTIPLE TOOTH EXTRACTIONS     PERIPHERAL VASCULAR ATHERECTOMY  05/01/2017   Procedure: PERIPHERAL VASCULAR ATHERECTOMY;  Surgeon: Adine Hoof, MD;  Location: [facility] INVASIVE CV LAB;  Service: Cardiovascular;;  Left SFA   PERIPHERAL VASCULAR ATHERECTOMY Right 11/18/2019   Procedure: PERIPHERAL VASCULAR ATHERECTOMY;  Surgeon: Adine Hoof, MD;  Location: [facility] INVASIVE CV LAB;  Service: Cardiovascular;  Laterality: Right;  SFA   PERIPHERAL VASCULAR BALLOON ANGIOPLASTY  05/01/2017   Procedure: PERIPHERAL VASCULAR BALLOON ANGIOPLASTY;  Surgeon: Adine Hoof, MD;  Location: [facility] INVASIVE CV LAB;  Service: Cardiovascular;;  Left Anterial Tibial Artery   PERIPHERAL VASCULAR BALLOON ANGIOPLASTY Right 01/15/2018   Procedure: PERIPHERAL VASCULAR BALLOON ANGIOPLASTY;  Surgeon: Adine Hoof, MD;  Location: [facility] INVASIVE CV LAB;  Service: Cardiovascular;  Laterality: Right;  SFA atherectomy   PERIPHERAL VASCULAR BALLOON ANGIOPLASTY Right 11/18/2019   Procedure: PERIPHERAL VASCULAR BALLOON ANGIOPLASTY;  Surgeon: Adine Hoof, MD;  Location: [facility] INVASIVE CV LAB;  Service: Cardiovascular;  Laterality: Right;  SFA   PERIPHERAL VASCULAR INTERVENTION Right 05/15/2017   Procedure: PERIPHERAL VASCULAR INTERVENTION;  Surgeon: Adine Hoof, MD;  Location: [facility] INVASIVE CV LAB;  Service: Cardiovascular;  Laterality: Right;  SFA Stent   PERIPHERAL VASCULAR INTERVENTION Left 02/20/2023   Procedure: PERIPHERAL VASCULAR INTERVENTION;  Surgeon: Adine Hoof, MD;  Location: [facility] INVASIVE CV LAB;  Service: Cardiovascular;  Laterality: Left;  Lt SFA   US  ECHOCARDIOGRAPHY  11-25-2008   EF 55-60%   No Known Allergies   PHYSICAL EXAM: General: The patient is alert and oriented x3 in no acute distress.  Dermatology: Skin is warm, dry and supple bilateral  lower extremities. Interspaces are clear of maceration and debris.  The left heel blister has resolved.  There is minimal peeling skin present.  No fissures are seen around the heel    Wound 1:  Location: Plantar left hallux        Depth: Full-thickness to subcutaneous tissue        Wound Border: Hyperkeratotic        Wound Base: Granular        Drainage: Minimal serosanguineous       Odor?:  None        Surrounding Tissue: No surrounding erythema with mild localized edema        Infected?:  No        Necrosis?:  No        Pain?:  No        Tunneling: No       Dimensions (cm): 0.8 x 0.3 x 0.3 cm       Vascular: Pedal pulses are palpable  ASSESSMENT / PLAN OF CARE: 1. Type 2 diabetes mellitus  with foot ulcer (CODE) (HCC)   2. Ulcer of left foot, with fat layer exposed (HCC)     The ulceration was sharply debrided of hyperkeratotic and devitalized soft tissue with sterile #312 blade to the level of subcutaneous tissue.  Hemostasis obtained.  Antibiotic ointment and DSD applied.  Reviewed off-loading with patient.  Continue with surgical shoe left foot.  Reviewed daily dressing changes with patient.  Informed patient that other health care agencies had been contacted and due to the reason of discharge from the original home health care agency, they will also not take him on as a client.  He will need to perform dressing changes himself at home to the best of his ability.  Discussed risks / concerns regarding ulcer with patient and possible sequelae if left untreated.  Stressed importance of infection prevention at home. Short-term goals are: prevent infection, off-load ulcer, heal ulcer Long-term goals are:  prevent recurrence, prevent amputation.   Return in about 2 weeks (around 07/14/2023) for f/u ulcer with Dr. Marvis Sluder.   Joe Murders, DPM, FACFAS [facility]     [address] [place], [place] [address]                 Office [phone]  Fax [phone]

## 2023-07-09 ENCOUNTER — Other Ambulatory Visit: Payer: Self-pay | Admitting: Podiatry

## 2023-07-09 DIAGNOSIS — L97522 Non-pressure chronic ulcer of other part of left foot with fat layer exposed: Secondary | ICD-10-CM

## 2023-07-09 DIAGNOSIS — E11621 Type 2 diabetes mellitus with foot ulcer: Secondary | ICD-10-CM

## 2023-07-09 DIAGNOSIS — R262 Difficulty in walking, not elsewhere classified: Secondary | ICD-10-CM

## 2023-07-09 NOTE — Progress Notes (Signed)
 Placed for home health care services for [facility] health care.  The medical assistant spoke with a representative from their company and they stated that they would take the patient back as a client as long as a new order was placed.

## 2023-07-12 ENCOUNTER — Telehealth: Payer: Self-pay

## 2023-07-12 NOTE — Telephone Encounter (Signed)
-----   Message from Joe Murders sent at 07/09/2023 10:31 PM EDT ----- Regarding: HHC order His HHC order was placed.  It needs set up with [facility] at your earliest convenience.  Thanks.

## 2023-07-12 NOTE — Telephone Encounter (Signed)
 Referral, office note and demographics faxed to  [facility] in [facility] [phone], fax [phone]

## 2023-07-18 ENCOUNTER — Ambulatory Visit (INDEPENDENT_AMBULATORY_CARE_PROVIDER_SITE_OTHER): Admitting: Podiatry

## 2023-07-18 DIAGNOSIS — L97522 Non-pressure chronic ulcer of other part of left foot with fat layer exposed: Secondary | ICD-10-CM

## 2023-07-18 DIAGNOSIS — E1140 Type 2 diabetes mellitus with diabetic neuropathy, unspecified: Secondary | ICD-10-CM

## 2023-07-18 NOTE — Progress Notes (Signed)
 Chief Complaint  Patient presents with   Wound Check    Wound check for the left first toe. It is Draining, today and open. Heel looks good today with no redness. A1c was 6 in March. Elliquis.    HPI: 83 y.o. male presents today for follow-up evaluation of ulceration to left first toe, partial amputation site.  He feels that the ulceration has been doing well.  He has been treating it at home with antibiotic ointment.  Area of heel ulceration appears resolved.  He has been receiving home health once again.   Past Medical History:  Diagnosis Date   Arthritis    Chronic renal insufficiency    CKD (chronic kidney disease), stage III (HCC)    COPD (chronic obstructive pulmonary disease) (HCC)    with restriction   Coronary disease October 2010   Status post stenting of the proximal to mid right coronary with DES.  Status post extensive stenting of the proximal right coronary to the crux.  He has moderate disease in  the left coronary system   Depression    Diabetes mellitus    Insulin  Dependent   Diastolic dysfunction    Congestive heart failure with   Dyspnea    multifactorial. chronic, 11/18/9- not an issue at this time   Gout    History of blood transfusion    History of diabetic ulcer of foot    right   Hypertension    Hypoventilation    syndrome   Obesity    Pneumonia    Pulmonary embolism [facility]) March 2010   hx of and DVT in March of 2010 on chronic Coumadin  therapy   Pulmonary hypertension (HCC)    Moderate to severe   Retroperitoneal bleed    Hx of right pelvic   Sleep apnea    Obstructive , no CPAP   Wears dentures     Past Surgical History:  Procedure Laterality Date   ABDOMINAL AORTOGRAM W/LOWER EXTREMITY Right 01/15/2018   Procedure: ABDOMINAL AORTOGRAM W/LOWER EXTREMITY;  Surgeon: Adine Hoof, MD;  Location: [facility] INVASIVE CV LAB;  Service: Cardiovascular;  Laterality: Right;   ABDOMINAL AORTOGRAM W/LOWER EXTREMITY Right 11/18/2019    Procedure: ABDOMINAL AORTOGRAM W/LOWER EXTREMITY;  Surgeon: Adine Hoof, MD;  Location: [facility] INVASIVE CV LAB;  Service: Cardiovascular;  Laterality: Right;   ABDOMINAL AORTOGRAM W/LOWER EXTREMITY Bilateral 02/20/2023   Procedure: ABDOMINAL AORTOGRAM W/LOWER EXTREMITY;  Surgeon: Adine Hoof, MD;  Location: [facility] INVASIVE CV LAB;  Service: Cardiovascular;  Laterality: Bilateral;   AMPUTATION TOE Left 05/04/2017   Procedure: Left hallux amputation;  Surgeon: Amada Backer, MD;  Location: [facility];  Service: Orthopedics;  Laterality: Left;   AMPUTATION TOE Right 12/21/2017   Procedure: Right hallux amputation;  Surgeon: Amada Backer, MD;  Location: [facility];  Service: Orthopedics;  Laterality: Right;    AMPUTATION TOE Right 02/15/2018   Procedure: Right 2nd toe amputation;  Surgeon: Amada Backer, MD;  Location: [facility] OR;  Service: Orthopedics;  Laterality: Right;  60   BACK SURGERY  2000   post   CARDIOVASCULAR STRESS TEST  11-14-2008   EF 44%   EXPLORATORY LAPAROTOMY  1972   POST   LOWER EXTREMITY ANGIOGRAPHY N/A 05/01/2017   Procedure: LOWER EXTREMITY ANGIOGRAPHY;  Surgeon: Adine Hoof, MD;  Location: [facility] INVASIVE CV LAB;  Service: Cardiovascular;  Laterality: N/A;   LOWER EXTREMITY ANGIOGRAPHY N/A 05/15/2017   Procedure: LOWER EXTREMITY ANGIOGRAPHY;  Surgeon: Adine Hoof, MD;  Location: [facility] INVASIVE CV LAB;  Service: Cardiovascular;  Laterality: N/A;   MULTIPLE TOOTH EXTRACTIONS     PERIPHERAL VASCULAR ATHERECTOMY  05/01/2017   Procedure: PERIPHERAL VASCULAR ATHERECTOMY;  Surgeon: Adine Hoof, MD;  Location: [facility] INVASIVE CV LAB;  Service: Cardiovascular;;  Left SFA   PERIPHERAL VASCULAR ATHERECTOMY Right 11/18/2019   Procedure: PERIPHERAL VASCULAR ATHERECTOMY;  Surgeon: Adine Hoof, MD;  Location: [facility] INVASIVE CV LAB;  Service: Cardiovascular;  Laterality: Right;  SFA   PERIPHERAL VASCULAR  BALLOON ANGIOPLASTY  05/01/2017   Procedure: PERIPHERAL VASCULAR BALLOON ANGIOPLASTY;  Surgeon: Adine Hoof, MD;  Location: [facility] INVASIVE CV LAB;  Service: Cardiovascular;;  Left Anterial Tibial Artery   PERIPHERAL VASCULAR BALLOON ANGIOPLASTY Right 01/15/2018   Procedure: PERIPHERAL VASCULAR BALLOON ANGIOPLASTY;  Surgeon: Adine Hoof, MD;  Location: [facility] INVASIVE CV LAB;  Service: Cardiovascular;  Laterality: Right;  SFA atherectomy   PERIPHERAL VASCULAR BALLOON ANGIOPLASTY Right 11/18/2019   Procedure: PERIPHERAL VASCULAR BALLOON ANGIOPLASTY;  Surgeon: Adine Hoof, MD;  Location: [facility] INVASIVE CV LAB;  Service: Cardiovascular;  Laterality: Right;  SFA   PERIPHERAL VASCULAR INTERVENTION Right 05/15/2017   Procedure: PERIPHERAL VASCULAR INTERVENTION;  Surgeon: Adine Hoof, MD;  Location: [facility] INVASIVE CV LAB;  Service: Cardiovascular;  Laterality: Right;  SFA Stent   PERIPHERAL VASCULAR INTERVENTION Left 02/20/2023   Procedure: PERIPHERAL VASCULAR INTERVENTION;  Surgeon: Adine Hoof, MD;  Location: [facility] INVASIVE CV LAB;  Service: Cardiovascular;  Laterality: Left;  Lt SFA   US  ECHOCARDIOGRAPHY  11-25-2008   EF 55-60%    No Known Allergies    Physical Exam: Trace palpable pedal pulses, PT pulses difficult to palpate.  Excoriations noted to legs bilaterally with skin changes consistent with chronic venous stasis.  Appears to be healing well measuring 1 x 0.3 x 0.2 cm predebridement.  Predominantly granulation tissue to the wound base with significant hyperkeratotic borders.  Wound margins have contracted.  Postdebridement measurements 1 x 0.6 x 0.3 cm, wound bed bleeding and viable minimal dorsiflexion available to the left first toe amputation stump.  Left hallux ulceration appears stable.   Assessment/Plan of Care: 1. Ulcer of left foot, with fat layer exposed (HCC)   2. Type 2 diabetes mellitus with diabetic neuropathy, unspecified  whether long term insulin  use (HCC)     Discussed clinical findings with patient today.  Ulcer excisionally debrided today using 312 scalpel blade to bleeding wound bed.  Patient tolerated this well, no anesthesia was necessary.  Hemostasis achieved with compression.  Silvadene  bandage applied.  Reviewed daily home dressing changes with patient  Felt offloading padding applied to the first toe amputation site.  Practice limited stance and gait.  Follow-up in 3 weeks.    Left hallux ulcer stable appearing, has slowly been improving.  If this plateaus can consider medial band plantar fasciotomy.   Doratha Mcswain L. Lunda Salines, AACFAS [facility]     [address] [place], [place] [address]                Office [phone]  Fax [phone]

## 2023-07-21 ENCOUNTER — Encounter: Payer: Self-pay | Admitting: Podiatry
